# Patient Record
Sex: Male | Born: 1945 | Race: White | Hispanic: No | Marital: Married | Smoking: Former smoker
Health system: Southern US, Community
[De-identification: ages and names within clinical notes are randomized; demographics above are authoritative.]

## PROBLEM LIST (undated history)

## (undated) DIAGNOSIS — E119 Type 2 diabetes mellitus without complications: Secondary | ICD-10-CM

## (undated) DIAGNOSIS — M439 Deforming dorsopathy, unspecified: Secondary | ICD-10-CM

## (undated) DIAGNOSIS — I10 Essential (primary) hypertension: Secondary | ICD-10-CM

## (undated) DIAGNOSIS — H3530 Unspecified macular degeneration: Secondary | ICD-10-CM

## (undated) DIAGNOSIS — G4733 Obstructive sleep apnea (adult) (pediatric): Secondary | ICD-10-CM

## (undated) DIAGNOSIS — I2510 Atherosclerotic heart disease of native coronary artery without angina pectoris: Secondary | ICD-10-CM

## (undated) DIAGNOSIS — D649 Anemia, unspecified: Secondary | ICD-10-CM

## (undated) DIAGNOSIS — E669 Obesity, unspecified: Secondary | ICD-10-CM

## (undated) DIAGNOSIS — H269 Unspecified cataract: Secondary | ICD-10-CM

## (undated) DIAGNOSIS — K8020 Calculus of gallbladder without cholecystitis without obstruction: Secondary | ICD-10-CM

## (undated) DIAGNOSIS — M1990 Unspecified osteoarthritis, unspecified site: Secondary | ICD-10-CM

## (undated) DIAGNOSIS — C4491 Basal cell carcinoma of skin, unspecified: Secondary | ICD-10-CM

## (undated) DIAGNOSIS — N200 Calculus of kidney: Secondary | ICD-10-CM

## (undated) HISTORY — DX: Obesity, unspecified: E66.9

## (undated) HISTORY — DX: Essential (primary) hypertension: I10

## (undated) HISTORY — PX: LAMINECTOMY: SHX219

## (undated) HISTORY — DX: Anemia, unspecified: D64.9

## (undated) HISTORY — PX: ANGIOPLASTY: SHX39

## (undated) HISTORY — DX: Calculus of kidney: N20.0

## (undated) HISTORY — PX: REPLACEMENT TOTAL KNEE: SUR1224

## (undated) HISTORY — PX: HIP SURGERY: SHX245

## (undated) HISTORY — DX: Deforming dorsopathy, unspecified: M43.9

## (undated) HISTORY — DX: Unspecified macular degeneration: H35.30

## (undated) HISTORY — DX: Obstructive sleep apnea (adult) (pediatric): G47.33

## (undated) HISTORY — DX: Atherosclerotic heart disease of native coronary artery without angina pectoris: I25.10

## (undated) HISTORY — DX: Calculus of gallbladder without cholecystitis without obstruction: K80.20

## (undated) HISTORY — PX: KNEE ARTHROSCOPY: SUR90

## (undated) HISTORY — DX: Unspecified cataract: H26.9

## (undated) HISTORY — DX: Basal cell carcinoma of skin, unspecified: C44.91

## (undated) HISTORY — PX: COLONOSCOPY: SHX174

---
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
## 1992-02-12 HISTORY — PX: REPLACEMENT TOTAL KNEE: SUR1224

## 1999-02-12 HISTORY — PX: CHOLECYSTECTOMY: SHX55

## 2003-02-12 DIAGNOSIS — I2510 Atherosclerotic heart disease of native coronary artery without angina pectoris: Secondary | ICD-10-CM

## 2003-02-12 HISTORY — DX: Atherosclerotic heart disease of native coronary artery without angina pectoris: I25.10

## 2003-07-13 HISTORY — PX: CORONARY ANGIOPLASTY WITH STENT PLACEMENT: SHX49

## 2005-05-12 HISTORY — PX: KNEE ARTHROSCOPY: SUR90

## 2006-10-07 HISTORY — PX: ROUX-EN-Y GASTRIC BYPASS: SHX1104

## 2006-10-07 HISTORY — PX: GASTRIC BYPASS: SHX52

## 2007-01-12 HISTORY — PX: TOTAL HIP ARTHROPLASTY: SHX124

## 2007-02-12 HISTORY — PX: LAMINECTOMY: SHX219

## 2007-03-15 HISTORY — PX: CATARACT EXTRACTION, BILATERAL: SHX1313

## 2009-02-11 HISTORY — PX: ABDOMINOPLASTY: SUR9

## 2015-04-10 DIAGNOSIS — E119 Type 2 diabetes mellitus without complications: Secondary | ICD-10-CM | POA: Diagnosis not present

## 2015-04-10 DIAGNOSIS — Z6836 Body mass index (BMI) 36.0-36.9, adult: Secondary | ICD-10-CM | POA: Diagnosis not present

## 2015-04-10 DIAGNOSIS — I10 Essential (primary) hypertension: Secondary | ICD-10-CM | POA: Diagnosis not present

## 2015-06-15 DIAGNOSIS — Z955 Presence of coronary angioplasty implant and graft: Secondary | ICD-10-CM | POA: Diagnosis not present

## 2015-06-15 DIAGNOSIS — I10 Essential (primary) hypertension: Secondary | ICD-10-CM | POA: Diagnosis not present

## 2015-06-15 DIAGNOSIS — I25110 Atherosclerotic heart disease of native coronary artery with unstable angina pectoris: Secondary | ICD-10-CM | POA: Diagnosis not present

## 2015-06-19 DIAGNOSIS — I25110 Atherosclerotic heart disease of native coronary artery with unstable angina pectoris: Secondary | ICD-10-CM | POA: Diagnosis not present

## 2015-06-19 DIAGNOSIS — Z791 Long term (current) use of non-steroidal anti-inflammatories (NSAID): Secondary | ICD-10-CM | POA: Diagnosis not present

## 2015-06-19 DIAGNOSIS — Z79899 Other long term (current) drug therapy: Secondary | ICD-10-CM | POA: Diagnosis not present

## 2015-06-19 DIAGNOSIS — E669 Obesity, unspecified: Secondary | ICD-10-CM | POA: Diagnosis not present

## 2015-06-19 DIAGNOSIS — I10 Essential (primary) hypertension: Secondary | ICD-10-CM | POA: Diagnosis not present

## 2015-06-19 DIAGNOSIS — M1711 Unilateral primary osteoarthritis, right knee: Secondary | ICD-10-CM | POA: Diagnosis not present

## 2015-06-19 DIAGNOSIS — Z800 Family history of malignant neoplasm of digestive organs: Secondary | ICD-10-CM | POA: Diagnosis not present

## 2015-06-19 DIAGNOSIS — Z9884 Bariatric surgery status: Secondary | ICD-10-CM | POA: Diagnosis not present

## 2015-06-19 DIAGNOSIS — E119 Type 2 diabetes mellitus without complications: Secondary | ICD-10-CM | POA: Diagnosis not present

## 2015-06-19 DIAGNOSIS — Z87891 Personal history of nicotine dependence: Secondary | ICD-10-CM | POA: Diagnosis not present

## 2015-06-19 DIAGNOSIS — Z6836 Body mass index (BMI) 36.0-36.9, adult: Secondary | ICD-10-CM | POA: Diagnosis not present

## 2015-06-19 DIAGNOSIS — Z7982 Long term (current) use of aspirin: Secondary | ICD-10-CM | POA: Diagnosis not present

## 2015-06-19 DIAGNOSIS — G4733 Obstructive sleep apnea (adult) (pediatric): Secondary | ICD-10-CM | POA: Diagnosis not present

## 2015-06-19 DIAGNOSIS — E785 Hyperlipidemia, unspecified: Secondary | ICD-10-CM | POA: Diagnosis not present

## 2015-06-20 DIAGNOSIS — I25110 Atherosclerotic heart disease of native coronary artery with unstable angina pectoris: Secondary | ICD-10-CM | POA: Diagnosis not present

## 2015-06-20 DIAGNOSIS — G4733 Obstructive sleep apnea (adult) (pediatric): Secondary | ICD-10-CM | POA: Diagnosis not present

## 2015-06-20 DIAGNOSIS — E785 Hyperlipidemia, unspecified: Secondary | ICD-10-CM | POA: Diagnosis not present

## 2015-06-20 DIAGNOSIS — M1711 Unilateral primary osteoarthritis, right knee: Secondary | ICD-10-CM | POA: Diagnosis not present

## 2015-06-20 DIAGNOSIS — E119 Type 2 diabetes mellitus without complications: Secondary | ICD-10-CM | POA: Diagnosis not present

## 2015-06-20 DIAGNOSIS — I10 Essential (primary) hypertension: Secondary | ICD-10-CM | POA: Diagnosis not present

## 2015-06-20 DIAGNOSIS — Z955 Presence of coronary angioplasty implant and graft: Secondary | ICD-10-CM | POA: Diagnosis not present

## 2015-06-22 DIAGNOSIS — E782 Mixed hyperlipidemia: Secondary | ICD-10-CM | POA: Diagnosis not present

## 2015-06-22 DIAGNOSIS — I2510 Atherosclerotic heart disease of native coronary artery without angina pectoris: Secondary | ICD-10-CM | POA: Diagnosis not present

## 2015-06-22 DIAGNOSIS — Z125 Encounter for screening for malignant neoplasm of prostate: Secondary | ICD-10-CM | POA: Diagnosis not present

## 2015-06-22 DIAGNOSIS — Z6836 Body mass index (BMI) 36.0-36.9, adult: Secondary | ICD-10-CM | POA: Diagnosis not present

## 2015-06-22 DIAGNOSIS — E119 Type 2 diabetes mellitus without complications: Secondary | ICD-10-CM | POA: Diagnosis not present

## 2015-06-22 DIAGNOSIS — I200 Unstable angina: Secondary | ICD-10-CM | POA: Diagnosis not present

## 2015-06-22 DIAGNOSIS — I10 Essential (primary) hypertension: Secondary | ICD-10-CM | POA: Diagnosis not present

## 2015-07-07 DIAGNOSIS — E784 Other hyperlipidemia: Secondary | ICD-10-CM | POA: Diagnosis not present

## 2015-07-07 DIAGNOSIS — I2510 Atherosclerotic heart disease of native coronary artery without angina pectoris: Secondary | ICD-10-CM | POA: Diagnosis not present

## 2015-07-07 DIAGNOSIS — I724 Aneurysm of artery of lower extremity: Secondary | ICD-10-CM | POA: Diagnosis not present

## 2015-07-07 DIAGNOSIS — Z955 Presence of coronary angioplasty implant and graft: Secondary | ICD-10-CM | POA: Diagnosis not present

## 2015-07-07 DIAGNOSIS — S301XXA Contusion of abdominal wall, initial encounter: Secondary | ICD-10-CM | POA: Diagnosis not present

## 2015-07-07 DIAGNOSIS — R2241 Localized swelling, mass and lump, right lower limb: Secondary | ICD-10-CM | POA: Diagnosis not present

## 2015-07-07 DIAGNOSIS — T82898A Other specified complication of vascular prosthetic devices, implants and grafts, initial encounter: Secondary | ICD-10-CM | POA: Diagnosis not present

## 2015-07-07 DIAGNOSIS — I10 Essential (primary) hypertension: Secondary | ICD-10-CM | POA: Diagnosis not present

## 2015-07-12 DIAGNOSIS — S41111A Laceration without foreign body of right upper arm, initial encounter: Secondary | ICD-10-CM | POA: Diagnosis not present

## 2015-07-14 DIAGNOSIS — S41111A Laceration without foreign body of right upper arm, initial encounter: Secondary | ICD-10-CM | POA: Diagnosis present

## 2015-07-14 DIAGNOSIS — Z955 Presence of coronary angioplasty implant and graft: Secondary | ICD-10-CM | POA: Diagnosis not present

## 2015-07-14 DIAGNOSIS — I255 Ischemic cardiomyopathy: Secondary | ICD-10-CM | POA: Diagnosis present

## 2015-07-14 DIAGNOSIS — K209 Esophagitis, unspecified: Secondary | ICD-10-CM | POA: Diagnosis present

## 2015-07-14 DIAGNOSIS — I2510 Atherosclerotic heart disease of native coronary artery without angina pectoris: Secondary | ICD-10-CM | POA: Diagnosis not present

## 2015-07-14 DIAGNOSIS — E1122 Type 2 diabetes mellitus with diabetic chronic kidney disease: Secondary | ICD-10-CM | POA: Diagnosis present

## 2015-07-14 DIAGNOSIS — S301XXA Contusion of abdominal wall, initial encounter: Secondary | ICD-10-CM | POA: Diagnosis present

## 2015-07-14 DIAGNOSIS — Z96653 Presence of artificial knee joint, bilateral: Secondary | ICD-10-CM | POA: Diagnosis present

## 2015-07-14 DIAGNOSIS — I10 Essential (primary) hypertension: Secondary | ICD-10-CM | POA: Diagnosis not present

## 2015-07-14 DIAGNOSIS — Z9861 Coronary angioplasty status: Secondary | ICD-10-CM | POA: Diagnosis not present

## 2015-07-14 DIAGNOSIS — E785 Hyperlipidemia, unspecified: Secondary | ICD-10-CM | POA: Diagnosis present

## 2015-07-14 DIAGNOSIS — Z87891 Personal history of nicotine dependence: Secondary | ICD-10-CM | POA: Diagnosis not present

## 2015-07-14 DIAGNOSIS — Z9884 Bariatric surgery status: Secondary | ICD-10-CM | POA: Diagnosis not present

## 2015-07-14 DIAGNOSIS — K210 Gastro-esophageal reflux disease with esophagitis: Secondary | ICD-10-CM | POA: Diagnosis not present

## 2015-07-14 DIAGNOSIS — I129 Hypertensive chronic kidney disease with stage 1 through stage 4 chronic kidney disease, or unspecified chronic kidney disease: Secondary | ICD-10-CM | POA: Diagnosis present

## 2015-07-14 DIAGNOSIS — D500 Iron deficiency anemia secondary to blood loss (chronic): Secondary | ICD-10-CM | POA: Diagnosis not present

## 2015-07-14 DIAGNOSIS — N189 Chronic kidney disease, unspecified: Secondary | ICD-10-CM | POA: Diagnosis present

## 2015-07-14 DIAGNOSIS — Z7982 Long term (current) use of aspirin: Secondary | ICD-10-CM | POA: Diagnosis not present

## 2015-07-14 DIAGNOSIS — E119 Type 2 diabetes mellitus without complications: Secondary | ICD-10-CM | POA: Diagnosis not present

## 2015-07-14 DIAGNOSIS — K922 Gastrointestinal hemorrhage, unspecified: Secondary | ICD-10-CM | POA: Diagnosis not present

## 2015-07-14 DIAGNOSIS — K219 Gastro-esophageal reflux disease without esophagitis: Secondary | ICD-10-CM | POA: Diagnosis present

## 2015-07-14 DIAGNOSIS — Z6837 Body mass index (BMI) 37.0-37.9, adult: Secondary | ICD-10-CM | POA: Diagnosis not present

## 2015-07-14 DIAGNOSIS — K921 Melena: Secondary | ICD-10-CM | POA: Diagnosis not present

## 2015-07-14 DIAGNOSIS — D62 Acute posthemorrhagic anemia: Secondary | ICD-10-CM | POA: Diagnosis not present

## 2015-07-18 DIAGNOSIS — H353131 Nonexudative age-related macular degeneration, bilateral, early dry stage: Secondary | ICD-10-CM | POA: Diagnosis not present

## 2015-07-18 DIAGNOSIS — H2513 Age-related nuclear cataract, bilateral: Secondary | ICD-10-CM | POA: Diagnosis not present

## 2015-07-18 DIAGNOSIS — H524 Presbyopia: Secondary | ICD-10-CM | POA: Diagnosis not present

## 2015-07-24 DIAGNOSIS — K921 Melena: Secondary | ICD-10-CM | POA: Diagnosis not present

## 2015-07-24 DIAGNOSIS — D500 Iron deficiency anemia secondary to blood loss (chronic): Secondary | ICD-10-CM | POA: Diagnosis not present

## 2015-07-24 DIAGNOSIS — I2510 Atherosclerotic heart disease of native coronary artery without angina pectoris: Secondary | ICD-10-CM | POA: Diagnosis not present

## 2015-07-24 DIAGNOSIS — Z6836 Body mass index (BMI) 36.0-36.9, adult: Secondary | ICD-10-CM | POA: Diagnosis not present

## 2015-07-24 DIAGNOSIS — T148 Other injury of unspecified body region: Secondary | ICD-10-CM | POA: Diagnosis not present

## 2015-08-14 DIAGNOSIS — D500 Iron deficiency anemia secondary to blood loss (chronic): Secondary | ICD-10-CM | POA: Diagnosis not present

## 2015-09-07 DIAGNOSIS — J069 Acute upper respiratory infection, unspecified: Secondary | ICD-10-CM | POA: Diagnosis not present

## 2015-09-07 DIAGNOSIS — D500 Iron deficiency anemia secondary to blood loss (chronic): Secondary | ICD-10-CM | POA: Diagnosis not present

## 2015-09-07 DIAGNOSIS — T148 Other injury of unspecified body region: Secondary | ICD-10-CM | POA: Diagnosis not present

## 2015-09-07 DIAGNOSIS — I2510 Atherosclerotic heart disease of native coronary artery without angina pectoris: Secondary | ICD-10-CM | POA: Diagnosis not present

## 2015-09-07 DIAGNOSIS — I10 Essential (primary) hypertension: Secondary | ICD-10-CM | POA: Diagnosis not present

## 2015-09-07 DIAGNOSIS — K921 Melena: Secondary | ICD-10-CM | POA: Diagnosis not present

## 2015-09-07 DIAGNOSIS — E782 Mixed hyperlipidemia: Secondary | ICD-10-CM | POA: Diagnosis not present

## 2015-09-07 DIAGNOSIS — R739 Hyperglycemia, unspecified: Secondary | ICD-10-CM | POA: Diagnosis not present

## 2015-09-19 DIAGNOSIS — I10 Essential (primary) hypertension: Secondary | ICD-10-CM | POA: Diagnosis not present

## 2015-10-06 DIAGNOSIS — I2510 Atherosclerotic heart disease of native coronary artery without angina pectoris: Secondary | ICD-10-CM | POA: Diagnosis not present

## 2015-10-06 DIAGNOSIS — Z955 Presence of coronary angioplasty implant and graft: Secondary | ICD-10-CM | POA: Diagnosis not present

## 2015-10-06 DIAGNOSIS — E782 Mixed hyperlipidemia: Secondary | ICD-10-CM | POA: Diagnosis not present

## 2015-10-06 DIAGNOSIS — I10 Essential (primary) hypertension: Secondary | ICD-10-CM | POA: Diagnosis not present

## 2015-10-17 DIAGNOSIS — E119 Type 2 diabetes mellitus without complications: Secondary | ICD-10-CM | POA: Diagnosis not present

## 2015-10-17 DIAGNOSIS — E782 Mixed hyperlipidemia: Secondary | ICD-10-CM | POA: Diagnosis not present

## 2015-10-17 DIAGNOSIS — I10 Essential (primary) hypertension: Secondary | ICD-10-CM | POA: Diagnosis not present

## 2015-10-17 DIAGNOSIS — Z125 Encounter for screening for malignant neoplasm of prostate: Secondary | ICD-10-CM | POA: Diagnosis not present

## 2015-10-17 DIAGNOSIS — E785 Hyperlipidemia, unspecified: Secondary | ICD-10-CM | POA: Diagnosis not present

## 2015-10-19 DIAGNOSIS — Z6835 Body mass index (BMI) 35.0-35.9, adult: Secondary | ICD-10-CM | POA: Diagnosis not present

## 2015-10-19 DIAGNOSIS — E669 Obesity, unspecified: Secondary | ICD-10-CM | POA: Diagnosis not present

## 2015-10-19 DIAGNOSIS — D500 Iron deficiency anemia secondary to blood loss (chronic): Secondary | ICD-10-CM | POA: Diagnosis not present

## 2015-10-19 DIAGNOSIS — I2510 Atherosclerotic heart disease of native coronary artery without angina pectoris: Secondary | ICD-10-CM | POA: Diagnosis not present

## 2015-10-19 DIAGNOSIS — E782 Mixed hyperlipidemia: Secondary | ICD-10-CM | POA: Diagnosis not present

## 2015-10-19 DIAGNOSIS — I10 Essential (primary) hypertension: Secondary | ICD-10-CM | POA: Diagnosis not present

## 2015-11-15 DIAGNOSIS — Z23 Encounter for immunization: Secondary | ICD-10-CM | POA: Diagnosis not present

## 2015-11-20 DIAGNOSIS — T84032A Mechanical loosening of internal right knee prosthetic joint, initial encounter: Secondary | ICD-10-CM | POA: Diagnosis not present

## 2015-11-20 DIAGNOSIS — M25561 Pain in right knee: Secondary | ICD-10-CM | POA: Diagnosis not present

## 2015-12-20 DIAGNOSIS — Z96651 Presence of right artificial knee joint: Secondary | ICD-10-CM | POA: Diagnosis not present

## 2015-12-21 DIAGNOSIS — Z96651 Presence of right artificial knee joint: Secondary | ICD-10-CM | POA: Diagnosis not present

## 2015-12-25 DIAGNOSIS — T84038A Mechanical loosening of other internal prosthetic joint, initial encounter: Secondary | ICD-10-CM | POA: Diagnosis not present

## 2015-12-25 DIAGNOSIS — Z96651 Presence of right artificial knee joint: Secondary | ICD-10-CM | POA: Diagnosis not present

## 2016-01-03 DIAGNOSIS — Z96659 Presence of unspecified artificial knee joint: Secondary | ICD-10-CM | POA: Diagnosis not present

## 2016-01-03 DIAGNOSIS — Z96653 Presence of artificial knee joint, bilateral: Secondary | ICD-10-CM | POA: Diagnosis not present

## 2016-01-03 DIAGNOSIS — T84038A Mechanical loosening of other internal prosthetic joint, initial encounter: Secondary | ICD-10-CM | POA: Diagnosis not present

## 2016-01-03 DIAGNOSIS — M25561 Pain in right knee: Secondary | ICD-10-CM | POA: Diagnosis not present

## 2016-01-03 DIAGNOSIS — Z96651 Presence of right artificial knee joint: Secondary | ICD-10-CM | POA: Diagnosis not present

## 2016-01-03 DIAGNOSIS — Z96652 Presence of left artificial knee joint: Secondary | ICD-10-CM | POA: Diagnosis not present

## 2016-01-23 DIAGNOSIS — T84032A Mechanical loosening of internal right knee prosthetic joint, initial encounter: Secondary | ICD-10-CM | POA: Diagnosis not present

## 2016-01-25 DIAGNOSIS — Z96651 Presence of right artificial knee joint: Secondary | ICD-10-CM | POA: Diagnosis not present

## 2016-01-25 DIAGNOSIS — T84028D Dislocation of other internal joint prosthesis, subsequent encounter: Secondary | ICD-10-CM | POA: Diagnosis not present

## 2016-01-25 DIAGNOSIS — Z96659 Presence of unspecified artificial knee joint: Secondary | ICD-10-CM | POA: Diagnosis not present

## 2016-02-22 DIAGNOSIS — Z955 Presence of coronary angioplasty implant and graft: Secondary | ICD-10-CM | POA: Diagnosis not present

## 2016-02-22 DIAGNOSIS — I2510 Atherosclerotic heart disease of native coronary artery without angina pectoris: Secondary | ICD-10-CM | POA: Diagnosis not present

## 2016-02-22 DIAGNOSIS — I10 Essential (primary) hypertension: Secondary | ICD-10-CM | POA: Diagnosis not present

## 2016-02-22 DIAGNOSIS — Z01810 Encounter for preprocedural cardiovascular examination: Secondary | ICD-10-CM | POA: Diagnosis not present

## 2016-02-22 DIAGNOSIS — M25561 Pain in right knee: Secondary | ICD-10-CM | POA: Diagnosis not present

## 2016-02-22 DIAGNOSIS — E785 Hyperlipidemia, unspecified: Secondary | ICD-10-CM | POA: Diagnosis not present

## 2016-04-16 DIAGNOSIS — Z6833 Body mass index (BMI) 33.0-33.9, adult: Secondary | ICD-10-CM | POA: Diagnosis not present

## 2016-04-16 DIAGNOSIS — Z0000 Encounter for general adult medical examination without abnormal findings: Secondary | ICD-10-CM | POA: Diagnosis not present

## 2016-05-20 DIAGNOSIS — E782 Mixed hyperlipidemia: Secondary | ICD-10-CM | POA: Insufficient documentation

## 2016-05-21 DIAGNOSIS — R001 Bradycardia, unspecified: Secondary | ICD-10-CM | POA: Diagnosis not present

## 2016-05-21 DIAGNOSIS — Z01818 Encounter for other preprocedural examination: Secondary | ICD-10-CM | POA: Diagnosis not present

## 2016-05-21 DIAGNOSIS — I10 Essential (primary) hypertension: Secondary | ICD-10-CM | POA: Diagnosis not present

## 2016-05-21 DIAGNOSIS — Z955 Presence of coronary angioplasty implant and graft: Secondary | ICD-10-CM | POA: Diagnosis not present

## 2016-05-21 DIAGNOSIS — I2510 Atherosclerotic heart disease of native coronary artery without angina pectoris: Secondary | ICD-10-CM | POA: Diagnosis not present

## 2016-05-21 DIAGNOSIS — E785 Hyperlipidemia, unspecified: Secondary | ICD-10-CM | POA: Diagnosis not present

## 2016-06-05 DIAGNOSIS — L821 Other seborrheic keratosis: Secondary | ICD-10-CM | POA: Diagnosis not present

## 2016-06-05 DIAGNOSIS — D492 Neoplasm of unspecified behavior of bone, soft tissue, and skin: Secondary | ICD-10-CM | POA: Diagnosis not present

## 2016-06-05 DIAGNOSIS — C44619 Basal cell carcinoma of skin of left upper limb, including shoulder: Secondary | ICD-10-CM | POA: Diagnosis not present

## 2016-06-10 DIAGNOSIS — E782 Mixed hyperlipidemia: Secondary | ICD-10-CM | POA: Diagnosis not present

## 2016-06-10 DIAGNOSIS — T84038A Mechanical loosening of other internal prosthetic joint, initial encounter: Secondary | ICD-10-CM | POA: Diagnosis not present

## 2016-06-10 DIAGNOSIS — Z01818 Encounter for other preprocedural examination: Secondary | ICD-10-CM | POA: Diagnosis not present

## 2016-06-10 DIAGNOSIS — I10 Essential (primary) hypertension: Secondary | ICD-10-CM | POA: Diagnosis not present

## 2016-06-10 DIAGNOSIS — R799 Abnormal finding of blood chemistry, unspecified: Secondary | ICD-10-CM | POA: Diagnosis not present

## 2016-06-10 DIAGNOSIS — Z96659 Presence of unspecified artificial knee joint: Secondary | ICD-10-CM | POA: Diagnosis not present

## 2016-06-10 DIAGNOSIS — I2510 Atherosclerotic heart disease of native coronary artery without angina pectoris: Secondary | ICD-10-CM | POA: Diagnosis not present

## 2016-06-21 DIAGNOSIS — I2510 Atherosclerotic heart disease of native coronary artery without angina pectoris: Secondary | ICD-10-CM | POA: Diagnosis present

## 2016-06-21 DIAGNOSIS — Z9884 Bariatric surgery status: Secondary | ICD-10-CM | POA: Diagnosis not present

## 2016-06-21 DIAGNOSIS — Z96651 Presence of right artificial knee joint: Secondary | ICD-10-CM | POA: Diagnosis not present

## 2016-06-21 DIAGNOSIS — Z96641 Presence of right artificial hip joint: Secondary | ICD-10-CM | POA: Diagnosis present

## 2016-06-21 DIAGNOSIS — T84022A Instability of internal right knee prosthesis, initial encounter: Secondary | ICD-10-CM | POA: Diagnosis not present

## 2016-06-21 DIAGNOSIS — G8918 Other acute postprocedural pain: Secondary | ICD-10-CM | POA: Diagnosis not present

## 2016-06-21 DIAGNOSIS — Z7982 Long term (current) use of aspirin: Secondary | ICD-10-CM | POA: Diagnosis not present

## 2016-06-21 DIAGNOSIS — M1711 Unilateral primary osteoarthritis, right knee: Secondary | ICD-10-CM | POA: Diagnosis not present

## 2016-06-21 DIAGNOSIS — T84038A Mechanical loosening of other internal prosthetic joint, initial encounter: Secondary | ICD-10-CM | POA: Diagnosis not present

## 2016-06-21 DIAGNOSIS — E782 Mixed hyperlipidemia: Secondary | ICD-10-CM | POA: Diagnosis present

## 2016-06-21 DIAGNOSIS — Z96652 Presence of left artificial knee joint: Secondary | ICD-10-CM | POA: Diagnosis present

## 2016-06-21 DIAGNOSIS — Z471 Aftercare following joint replacement surgery: Secondary | ICD-10-CM | POA: Diagnosis not present

## 2016-06-21 DIAGNOSIS — Z955 Presence of coronary angioplasty implant and graft: Secondary | ICD-10-CM | POA: Diagnosis not present

## 2016-06-21 DIAGNOSIS — I10 Essential (primary) hypertension: Secondary | ICD-10-CM | POA: Diagnosis present

## 2016-06-21 DIAGNOSIS — M25561 Pain in right knee: Secondary | ICD-10-CM | POA: Diagnosis not present

## 2016-06-21 DIAGNOSIS — Z7902 Long term (current) use of antithrombotics/antiplatelets: Secondary | ICD-10-CM | POA: Diagnosis not present

## 2016-06-21 DIAGNOSIS — N179 Acute kidney failure, unspecified: Secondary | ICD-10-CM | POA: Diagnosis not present

## 2016-06-21 DIAGNOSIS — T84032A Mechanical loosening of internal right knee prosthetic joint, initial encounter: Secondary | ICD-10-CM | POA: Diagnosis present

## 2016-06-21 DIAGNOSIS — G4733 Obstructive sleep apnea (adult) (pediatric): Secondary | ICD-10-CM | POA: Diagnosis present

## 2016-06-26 DIAGNOSIS — I2510 Atherosclerotic heart disease of native coronary artery without angina pectoris: Secondary | ICD-10-CM | POA: Diagnosis not present

## 2016-06-26 DIAGNOSIS — T84032D Mechanical loosening of internal right knee prosthetic joint, subsequent encounter: Secondary | ICD-10-CM | POA: Diagnosis not present

## 2016-06-26 DIAGNOSIS — I10 Essential (primary) hypertension: Secondary | ICD-10-CM | POA: Diagnosis not present

## 2016-06-27 DIAGNOSIS — I2510 Atherosclerotic heart disease of native coronary artery without angina pectoris: Secondary | ICD-10-CM | POA: Diagnosis not present

## 2016-06-27 DIAGNOSIS — T84032D Mechanical loosening of internal right knee prosthetic joint, subsequent encounter: Secondary | ICD-10-CM | POA: Diagnosis not present

## 2016-06-27 DIAGNOSIS — I10 Essential (primary) hypertension: Secondary | ICD-10-CM | POA: Diagnosis not present

## 2016-06-28 DIAGNOSIS — T84032D Mechanical loosening of internal right knee prosthetic joint, subsequent encounter: Secondary | ICD-10-CM | POA: Diagnosis not present

## 2016-06-28 DIAGNOSIS — I10 Essential (primary) hypertension: Secondary | ICD-10-CM | POA: Diagnosis not present

## 2016-06-28 DIAGNOSIS — I2510 Atherosclerotic heart disease of native coronary artery without angina pectoris: Secondary | ICD-10-CM | POA: Diagnosis not present

## 2016-07-01 DIAGNOSIS — T84032D Mechanical loosening of internal right knee prosthetic joint, subsequent encounter: Secondary | ICD-10-CM | POA: Diagnosis not present

## 2016-07-01 DIAGNOSIS — I2510 Atherosclerotic heart disease of native coronary artery without angina pectoris: Secondary | ICD-10-CM | POA: Diagnosis not present

## 2016-07-01 DIAGNOSIS — I10 Essential (primary) hypertension: Secondary | ICD-10-CM | POA: Diagnosis not present

## 2016-07-02 DIAGNOSIS — T84032D Mechanical loosening of internal right knee prosthetic joint, subsequent encounter: Secondary | ICD-10-CM | POA: Diagnosis not present

## 2016-07-02 DIAGNOSIS — I10 Essential (primary) hypertension: Secondary | ICD-10-CM | POA: Diagnosis not present

## 2016-07-02 DIAGNOSIS — I2510 Atherosclerotic heart disease of native coronary artery without angina pectoris: Secondary | ICD-10-CM | POA: Diagnosis not present

## 2016-07-03 DIAGNOSIS — I2510 Atherosclerotic heart disease of native coronary artery without angina pectoris: Secondary | ICD-10-CM | POA: Diagnosis not present

## 2016-07-03 DIAGNOSIS — T84032D Mechanical loosening of internal right knee prosthetic joint, subsequent encounter: Secondary | ICD-10-CM | POA: Diagnosis not present

## 2016-07-03 DIAGNOSIS — I10 Essential (primary) hypertension: Secondary | ICD-10-CM | POA: Diagnosis not present

## 2016-07-04 DIAGNOSIS — I2510 Atherosclerotic heart disease of native coronary artery without angina pectoris: Secondary | ICD-10-CM | POA: Diagnosis not present

## 2016-07-04 DIAGNOSIS — T84032D Mechanical loosening of internal right knee prosthetic joint, subsequent encounter: Secondary | ICD-10-CM | POA: Diagnosis not present

## 2016-07-04 DIAGNOSIS — I10 Essential (primary) hypertension: Secondary | ICD-10-CM | POA: Diagnosis not present

## 2016-07-05 DIAGNOSIS — T84032D Mechanical loosening of internal right knee prosthetic joint, subsequent encounter: Secondary | ICD-10-CM | POA: Diagnosis not present

## 2016-07-05 DIAGNOSIS — I2510 Atherosclerotic heart disease of native coronary artery without angina pectoris: Secondary | ICD-10-CM | POA: Diagnosis not present

## 2016-07-05 DIAGNOSIS — I10 Essential (primary) hypertension: Secondary | ICD-10-CM | POA: Diagnosis not present

## 2016-07-09 DIAGNOSIS — I10 Essential (primary) hypertension: Secondary | ICD-10-CM | POA: Diagnosis not present

## 2016-07-09 DIAGNOSIS — I2510 Atherosclerotic heart disease of native coronary artery without angina pectoris: Secondary | ICD-10-CM | POA: Diagnosis not present

## 2016-07-09 DIAGNOSIS — T84032D Mechanical loosening of internal right knee prosthetic joint, subsequent encounter: Secondary | ICD-10-CM | POA: Diagnosis not present

## 2016-07-10 DIAGNOSIS — T84032D Mechanical loosening of internal right knee prosthetic joint, subsequent encounter: Secondary | ICD-10-CM | POA: Diagnosis not present

## 2016-07-10 DIAGNOSIS — I10 Essential (primary) hypertension: Secondary | ICD-10-CM | POA: Diagnosis not present

## 2016-07-10 DIAGNOSIS — I2510 Atherosclerotic heart disease of native coronary artery without angina pectoris: Secondary | ICD-10-CM | POA: Diagnosis not present

## 2016-07-11 DIAGNOSIS — T84032D Mechanical loosening of internal right knee prosthetic joint, subsequent encounter: Secondary | ICD-10-CM | POA: Diagnosis not present

## 2016-07-11 DIAGNOSIS — I2510 Atherosclerotic heart disease of native coronary artery without angina pectoris: Secondary | ICD-10-CM | POA: Diagnosis not present

## 2016-07-11 DIAGNOSIS — I10 Essential (primary) hypertension: Secondary | ICD-10-CM | POA: Diagnosis not present

## 2016-07-17 DIAGNOSIS — M62551 Muscle wasting and atrophy, not elsewhere classified, right thigh: Secondary | ICD-10-CM | POA: Diagnosis not present

## 2016-07-17 DIAGNOSIS — R262 Difficulty in walking, not elsewhere classified: Secondary | ICD-10-CM | POA: Diagnosis not present

## 2016-07-17 DIAGNOSIS — G8929 Other chronic pain: Secondary | ICD-10-CM | POA: Diagnosis not present

## 2016-07-17 DIAGNOSIS — M25661 Stiffness of right knee, not elsewhere classified: Secondary | ICD-10-CM | POA: Diagnosis not present

## 2016-07-17 DIAGNOSIS — M25561 Pain in right knee: Secondary | ICD-10-CM | POA: Diagnosis not present

## 2016-07-17 DIAGNOSIS — M25461 Effusion, right knee: Secondary | ICD-10-CM | POA: Diagnosis not present

## 2016-07-17 DIAGNOSIS — Z96651 Presence of right artificial knee joint: Secondary | ICD-10-CM | POA: Diagnosis not present

## 2016-07-22 DIAGNOSIS — H25013 Cortical age-related cataract, bilateral: Secondary | ICD-10-CM | POA: Diagnosis not present

## 2016-07-22 DIAGNOSIS — E113293 Type 2 diabetes mellitus with mild nonproliferative diabetic retinopathy without macular edema, bilateral: Secondary | ICD-10-CM | POA: Diagnosis not present

## 2016-07-22 DIAGNOSIS — H353132 Nonexudative age-related macular degeneration, bilateral, intermediate dry stage: Secondary | ICD-10-CM | POA: Diagnosis not present

## 2016-07-22 DIAGNOSIS — H524 Presbyopia: Secondary | ICD-10-CM | POA: Diagnosis not present

## 2016-07-23 DIAGNOSIS — M25461 Effusion, right knee: Secondary | ICD-10-CM | POA: Diagnosis not present

## 2016-07-23 DIAGNOSIS — Z96651 Presence of right artificial knee joint: Secondary | ICD-10-CM | POA: Diagnosis not present

## 2016-07-23 DIAGNOSIS — R262 Difficulty in walking, not elsewhere classified: Secondary | ICD-10-CM | POA: Diagnosis not present

## 2016-07-23 DIAGNOSIS — M62551 Muscle wasting and atrophy, not elsewhere classified, right thigh: Secondary | ICD-10-CM | POA: Diagnosis not present

## 2016-07-23 DIAGNOSIS — M25561 Pain in right knee: Secondary | ICD-10-CM | POA: Diagnosis not present

## 2016-07-23 DIAGNOSIS — M25661 Stiffness of right knee, not elsewhere classified: Secondary | ICD-10-CM | POA: Diagnosis not present

## 2016-07-23 DIAGNOSIS — G8929 Other chronic pain: Secondary | ICD-10-CM | POA: Diagnosis not present

## 2016-07-31 DIAGNOSIS — G8929 Other chronic pain: Secondary | ICD-10-CM | POA: Diagnosis not present

## 2016-07-31 DIAGNOSIS — M25661 Stiffness of right knee, not elsewhere classified: Secondary | ICD-10-CM | POA: Diagnosis not present

## 2016-07-31 DIAGNOSIS — Z96651 Presence of right artificial knee joint: Secondary | ICD-10-CM | POA: Diagnosis not present

## 2016-07-31 DIAGNOSIS — M25461 Effusion, right knee: Secondary | ICD-10-CM | POA: Diagnosis not present

## 2016-07-31 DIAGNOSIS — M62551 Muscle wasting and atrophy, not elsewhere classified, right thigh: Secondary | ICD-10-CM | POA: Diagnosis not present

## 2016-07-31 DIAGNOSIS — R262 Difficulty in walking, not elsewhere classified: Secondary | ICD-10-CM | POA: Diagnosis not present

## 2016-07-31 DIAGNOSIS — M25561 Pain in right knee: Secondary | ICD-10-CM | POA: Diagnosis not present

## 2016-08-02 DIAGNOSIS — M62551 Muscle wasting and atrophy, not elsewhere classified, right thigh: Secondary | ICD-10-CM | POA: Diagnosis not present

## 2016-08-02 DIAGNOSIS — R262 Difficulty in walking, not elsewhere classified: Secondary | ICD-10-CM | POA: Diagnosis not present

## 2016-08-02 DIAGNOSIS — Z96651 Presence of right artificial knee joint: Secondary | ICD-10-CM | POA: Diagnosis not present

## 2016-08-02 DIAGNOSIS — M25561 Pain in right knee: Secondary | ICD-10-CM | POA: Diagnosis not present

## 2016-08-02 DIAGNOSIS — G8929 Other chronic pain: Secondary | ICD-10-CM | POA: Diagnosis not present

## 2016-08-02 DIAGNOSIS — M25661 Stiffness of right knee, not elsewhere classified: Secondary | ICD-10-CM | POA: Diagnosis not present

## 2016-08-02 DIAGNOSIS — M25461 Effusion, right knee: Secondary | ICD-10-CM | POA: Diagnosis not present

## 2016-08-06 DIAGNOSIS — R262 Difficulty in walking, not elsewhere classified: Secondary | ICD-10-CM | POA: Diagnosis not present

## 2016-08-06 DIAGNOSIS — M25561 Pain in right knee: Secondary | ICD-10-CM | POA: Diagnosis not present

## 2016-08-06 DIAGNOSIS — M25461 Effusion, right knee: Secondary | ICD-10-CM | POA: Diagnosis not present

## 2016-08-06 DIAGNOSIS — M62551 Muscle wasting and atrophy, not elsewhere classified, right thigh: Secondary | ICD-10-CM | POA: Diagnosis not present

## 2016-08-06 DIAGNOSIS — Z96651 Presence of right artificial knee joint: Secondary | ICD-10-CM | POA: Diagnosis not present

## 2016-08-06 DIAGNOSIS — M25661 Stiffness of right knee, not elsewhere classified: Secondary | ICD-10-CM | POA: Diagnosis not present

## 2016-08-06 DIAGNOSIS — G8929 Other chronic pain: Secondary | ICD-10-CM | POA: Diagnosis not present

## 2016-08-08 DIAGNOSIS — Z96651 Presence of right artificial knee joint: Secondary | ICD-10-CM | POA: Diagnosis not present

## 2016-08-08 DIAGNOSIS — M25561 Pain in right knee: Secondary | ICD-10-CM | POA: Diagnosis not present

## 2016-08-13 DIAGNOSIS — R262 Difficulty in walking, not elsewhere classified: Secondary | ICD-10-CM | POA: Diagnosis not present

## 2016-08-13 DIAGNOSIS — M25561 Pain in right knee: Secondary | ICD-10-CM | POA: Diagnosis not present

## 2016-08-13 DIAGNOSIS — M62551 Muscle wasting and atrophy, not elsewhere classified, right thigh: Secondary | ICD-10-CM | POA: Diagnosis not present

## 2016-08-13 DIAGNOSIS — M25461 Effusion, right knee: Secondary | ICD-10-CM | POA: Diagnosis not present

## 2016-08-13 DIAGNOSIS — Z96651 Presence of right artificial knee joint: Secondary | ICD-10-CM | POA: Diagnosis not present

## 2016-08-13 DIAGNOSIS — M25661 Stiffness of right knee, not elsewhere classified: Secondary | ICD-10-CM | POA: Diagnosis not present

## 2016-08-13 DIAGNOSIS — G8929 Other chronic pain: Secondary | ICD-10-CM | POA: Diagnosis not present

## 2016-08-15 DIAGNOSIS — Z96651 Presence of right artificial knee joint: Secondary | ICD-10-CM | POA: Diagnosis not present

## 2016-08-15 DIAGNOSIS — M62551 Muscle wasting and atrophy, not elsewhere classified, right thigh: Secondary | ICD-10-CM | POA: Diagnosis not present

## 2016-08-15 DIAGNOSIS — M25661 Stiffness of right knee, not elsewhere classified: Secondary | ICD-10-CM | POA: Diagnosis not present

## 2016-08-15 DIAGNOSIS — G8929 Other chronic pain: Secondary | ICD-10-CM | POA: Diagnosis not present

## 2016-08-15 DIAGNOSIS — M25561 Pain in right knee: Secondary | ICD-10-CM | POA: Diagnosis not present

## 2016-08-15 DIAGNOSIS — R262 Difficulty in walking, not elsewhere classified: Secondary | ICD-10-CM | POA: Diagnosis not present

## 2016-08-15 DIAGNOSIS — M25461 Effusion, right knee: Secondary | ICD-10-CM | POA: Diagnosis not present

## 2016-08-19 DIAGNOSIS — M62551 Muscle wasting and atrophy, not elsewhere classified, right thigh: Secondary | ICD-10-CM | POA: Diagnosis not present

## 2016-08-19 DIAGNOSIS — R262 Difficulty in walking, not elsewhere classified: Secondary | ICD-10-CM | POA: Diagnosis not present

## 2016-08-19 DIAGNOSIS — M25461 Effusion, right knee: Secondary | ICD-10-CM | POA: Diagnosis not present

## 2016-08-19 DIAGNOSIS — G8929 Other chronic pain: Secondary | ICD-10-CM | POA: Diagnosis not present

## 2016-08-19 DIAGNOSIS — Z96651 Presence of right artificial knee joint: Secondary | ICD-10-CM | POA: Diagnosis not present

## 2016-08-19 DIAGNOSIS — M25661 Stiffness of right knee, not elsewhere classified: Secondary | ICD-10-CM | POA: Diagnosis not present

## 2016-08-19 DIAGNOSIS — M25561 Pain in right knee: Secondary | ICD-10-CM | POA: Diagnosis not present

## 2016-08-21 DIAGNOSIS — M25461 Effusion, right knee: Secondary | ICD-10-CM | POA: Diagnosis not present

## 2016-08-21 DIAGNOSIS — G8929 Other chronic pain: Secondary | ICD-10-CM | POA: Diagnosis not present

## 2016-08-21 DIAGNOSIS — M25561 Pain in right knee: Secondary | ICD-10-CM | POA: Diagnosis not present

## 2016-08-21 DIAGNOSIS — Z96651 Presence of right artificial knee joint: Secondary | ICD-10-CM | POA: Diagnosis not present

## 2016-08-21 DIAGNOSIS — R262 Difficulty in walking, not elsewhere classified: Secondary | ICD-10-CM | POA: Diagnosis not present

## 2016-08-21 DIAGNOSIS — M62551 Muscle wasting and atrophy, not elsewhere classified, right thigh: Secondary | ICD-10-CM | POA: Diagnosis not present

## 2016-08-21 DIAGNOSIS — M25661 Stiffness of right knee, not elsewhere classified: Secondary | ICD-10-CM | POA: Diagnosis not present

## 2016-10-16 DIAGNOSIS — Z0189 Encounter for other specified special examinations: Secondary | ICD-10-CM | POA: Diagnosis not present

## 2016-10-16 DIAGNOSIS — C44619 Basal cell carcinoma of skin of left upper limb, including shoulder: Secondary | ICD-10-CM | POA: Diagnosis not present

## 2016-10-17 DIAGNOSIS — A4902 Methicillin resistant Staphylococcus aureus infection, unspecified site: Secondary | ICD-10-CM | POA: Diagnosis not present

## 2016-10-29 DIAGNOSIS — Z23 Encounter for immunization: Secondary | ICD-10-CM | POA: Diagnosis not present

## 2016-11-13 DIAGNOSIS — B9689 Other specified bacterial agents as the cause of diseases classified elsewhere: Secondary | ICD-10-CM | POA: Diagnosis not present

## 2016-11-13 DIAGNOSIS — Z08 Encounter for follow-up examination after completed treatment for malignant neoplasm: Secondary | ICD-10-CM | POA: Diagnosis not present

## 2016-11-13 DIAGNOSIS — L02426 Furuncle of left lower limb: Secondary | ICD-10-CM | POA: Diagnosis not present

## 2016-11-13 DIAGNOSIS — Z85828 Personal history of other malignant neoplasm of skin: Secondary | ICD-10-CM | POA: Diagnosis not present

## 2016-12-19 DIAGNOSIS — Z6837 Body mass index (BMI) 37.0-37.9, adult: Secondary | ICD-10-CM | POA: Diagnosis not present

## 2016-12-19 DIAGNOSIS — D6489 Other specified anemias: Secondary | ICD-10-CM | POA: Diagnosis not present

## 2016-12-19 DIAGNOSIS — I2510 Atherosclerotic heart disease of native coronary artery without angina pectoris: Secondary | ICD-10-CM | POA: Diagnosis not present

## 2016-12-19 DIAGNOSIS — Z1389 Encounter for screening for other disorder: Secondary | ICD-10-CM | POA: Diagnosis not present

## 2016-12-19 DIAGNOSIS — H3530 Unspecified macular degeneration: Secondary | ICD-10-CM | POA: Diagnosis not present

## 2016-12-19 DIAGNOSIS — H268 Other specified cataract: Secondary | ICD-10-CM | POA: Diagnosis not present

## 2016-12-19 DIAGNOSIS — E668 Other obesity: Secondary | ICD-10-CM | POA: Diagnosis not present

## 2016-12-19 DIAGNOSIS — E119 Type 2 diabetes mellitus without complications: Secondary | ICD-10-CM | POA: Diagnosis not present

## 2016-12-19 DIAGNOSIS — J3089 Other allergic rhinitis: Secondary | ICD-10-CM | POA: Diagnosis not present

## 2016-12-19 DIAGNOSIS — I10 Essential (primary) hypertension: Secondary | ICD-10-CM | POA: Diagnosis not present

## 2016-12-19 DIAGNOSIS — M1990 Unspecified osteoarthritis, unspecified site: Secondary | ICD-10-CM | POA: Diagnosis not present

## 2016-12-19 DIAGNOSIS — E7849 Other hyperlipidemia: Secondary | ICD-10-CM | POA: Diagnosis not present

## 2016-12-27 DIAGNOSIS — D649 Anemia, unspecified: Secondary | ICD-10-CM | POA: Diagnosis not present

## 2016-12-27 DIAGNOSIS — N183 Chronic kidney disease, stage 3 (moderate): Secondary | ICD-10-CM | POA: Diagnosis not present

## 2016-12-27 DIAGNOSIS — Z79899 Other long term (current) drug therapy: Secondary | ICD-10-CM | POA: Diagnosis not present

## 2016-12-27 DIAGNOSIS — R82998 Other abnormal findings in urine: Secondary | ICD-10-CM | POA: Diagnosis not present

## 2016-12-27 DIAGNOSIS — Z0000 Encounter for general adult medical examination without abnormal findings: Secondary | ICD-10-CM | POA: Diagnosis not present

## 2016-12-27 DIAGNOSIS — Z125 Encounter for screening for malignant neoplasm of prostate: Secondary | ICD-10-CM | POA: Diagnosis not present

## 2016-12-27 DIAGNOSIS — I10 Essential (primary) hypertension: Secondary | ICD-10-CM | POA: Diagnosis not present

## 2016-12-27 DIAGNOSIS — E1129 Type 2 diabetes mellitus with other diabetic kidney complication: Secondary | ICD-10-CM | POA: Diagnosis not present

## 2017-01-09 ENCOUNTER — Other Ambulatory Visit (HOSPITAL_COMMUNITY): Payer: Self-pay | Admitting: *Deleted

## 2017-01-09 NOTE — Discharge Instructions (Signed)

## 2017-01-09 NOTE — Progress Notes (Signed)
Cardiology Office Note   Date:  01/10/2017   ID:  Carlos Moreno, DOB 1945/06/21, MRN [id number]  PCP:  Crist Infante, MD  Cardiologist:   Minus Breeding, MD  Referring:  Crist Infante, MD  Chief Complaint  Patient presents with  . Coronary Artery Disease      History of Present Illness: Carlos Moreno is a 71 y.o. male who is referred by Dr. Dr. Joylene Draft for evaluation of CAD.  He had distant stenting in 2005.  Have these records.  He also had more recent stenting in 2007.  He thinks this was to his LAD.  Again I do not have the specific records.  The first time he was being screened for gastric surgery and had an abnormal stress test.  The second time he had some vague chest discomfort and was found to have a lesion.  He said he is done well with his angioplasties.  He denies any cardiovascular symptoms.  He is somewhat limited by chronic joint pain.  He denies any chest pressure, neck or arm discomfort.  He said no palpitations, presyncope or syncope.  He denies any PND or orthopnea.  Of note he did have bleeding on Brilinta but tolerated Plavix.  He took the Plavix and aspirin together for 1 year.   Past Medical History:  Diagnosis Date  . Anemia   . Basal cell carcinoma   . CAD (coronary artery disease)    Stent 2017.  Stent 2005 (Details of both stents pending)  . Cataract   . Curvature of spine   . Hypertension   . Macular degeneration   . Obesity   . OSA (obstructive sleep apnea)    CPAP    Past Surgical History:  Procedure Laterality Date  . ABDOMINOPLASTY  02/2009  . ANGIOPLASTY  06/05 05/17   with Stents  . CHOLECYSTECTOMY  2001  . COLONOSCOPY  08/07 11/12 04/16   . GASTRIC BYPASS  10/07/2006  . HERNIA REPAIR Left 1956  . KNEE ARTHROSCOPY Right 04/07 12/14 05/18    x3  . LAMINECTOMY     Lumbar  . REPLACEMENT TOTAL KNEE Left 1994  . TOTAL HIP ARTHROPLASTY  01/2007     Current Outpatient Medications  Medication Sig Dispense Refill  . aspirin EC 81 MG  tablet Take 81 mg by mouth daily.    Marland Kitchen atorvastatin (LIPITOR) 40 MG tablet Take 40 mg by mouth daily.  3  . b complex vitamins capsule Take 1 capsule by mouth as directed.    Marland Kitchen BYSTOLIC 5 MG tablet Take 5 mg by mouth daily.  3  . calcium gluconate 500 MG tablet Take 1 tablet by mouth daily.    . cetirizine (ZYRTEC) 10 MG tablet Take 10 mg by mouth daily.    . Cholecalciferol (VITAMIN D3) 5000 units CAPS Take 1 capsule by mouth daily.    . Cyanocobalamin (B-12) 5000 MCG SUBL Place 1 tablet under the tongue as directed.    . Diphenhydramine-APAP, sleep, (TYLENOL PM EXTRA STRENGTH PO) Take 1 tablet by mouth daily.    . isosorbide mononitrate (IMDUR) 30 MG 24 hr tablet Take 30 mg by mouth daily.  3  . losartan (COZAAR) 50 MG tablet Take 1 tablet (50 mg total) by mouth daily. 90 tablet 3  . Multiple Vitamin (MULTIVITAMIN) tablet Take 1 tablet by mouth daily.    . Multiple Vitamins-Minerals (PRESERVISION AREDS 2 PO) Take 1 tablet by mouth daily.    . Polysaccharide Iron Complex (IRON UP  PO) Take 120 mg by mouth daily.    . vitamin C (ASCORBIC ACID) 500 MG tablet Take 500 mg by mouth daily.    Marland Kitchen zinc gluconate 50 MG tablet Take 50 mg by mouth daily.    . nitroGLYCERIN (NITROSTAT) 0.4 MG SL tablet Place 1 tablet (0.4 mg total) under the tongue every 5 (five) minutes as needed for chest pain. 25 tablet 3   No current facility-administered medications for this visit.     Allergies:   Patient has no allergy information on record.    Social History:  The patient  reports that  has never smoked. he has never used smokeless tobacco.   Family History:  The patient's family history includes Colon cancer in his mother; Diabetes in his paternal grandmother; Rheum arthritis in his father; Tuberculosis in his paternal grandfather.    ROS:  Please see the history of present illness.   Otherwise, review of systems are positive for none.   All other systems are reviewed and negative.    PHYSICAL EXAM: VS:   BP (!) 112/52 (BP Location: Left Arm)   Pulse 62   Ht 5' 9.5" (1.765 m)   Wt 252 lb 6.4 oz (114.5 kg)   BMI 36.74 kg/m  , BMI Body mass index is 36.74 kg/m. GENERAL:  Well appearing HEENT:  Pupils equal round and reactive, fundi not visualized, oral mucosa unremarkable NECK:  No jugular venous distention, waveform within normal limits, carotid upstroke brisk and symmetric, no bruits, no thyromegaly LYMPHATICS:  No cervical, inguinal adenopathy LUNGS:  Clear to auscultation bilaterally BACK:  No CVA tenderness CHEST:  Unremarkable HEART:  PMI not displaced or sustained,S1 and S2 within normal limits, no S3, no S4, no clicks, no rubs, no murmurs ABD:  Flat, positive bowel sounds normal in frequency in pitch, no bruits, no rebound, no guarding, no midline pulsatile mass, no hepatomegaly, no splenomegaly EXT:  2 plus pulses throughout, no edema, no cyanosis no clubbing SKIN:  No rashes no nodules NEURO:  Cranial nerves II through XII grossly intact, motor grossly intact throughout PSYCH:  Cognitively intact, oriented to person place and time    EKG:  EKG is ordered today. The ekg ordered today demonstrates sinus rhythm, rate 62, axis within normal limits, intervals within normal limits, no acute ST-T wave changes.   Recent Labs: No results found for requested labs within last 8760 hours.    Lipid Panel No results found for: CHOL, TRIG, HDL, CHOLHDL, VLDL, LDLCALC, LDLDIRECT    Wt Readings from Last 3 Encounters:  01/10/17 252 lb 6.4 oz (114.5 kg)      Other studies Reviewed: Additional studies/ records that were reviewed today include: Office records and patient records that he brings with him. . Review of the above records demonstrates:  Please see elsewhere in the note.     ASSESSMENT AND PLAN:   CAD: I am going to get his old records.  He has no ongoing symptoms.  I will continue with secondary risk reduction  HTN:  His BP has been elevated and I will increase his  Cozaar to 50 mg daily.  I would like him to have a repeat BMET in about two weeks.   I reviewed a blood pressure diary.  OBESITY:  We talked about diet and exercise and he has joined the [facility] and could get in the pool.  DYSLIPIDEMIA: His LDL was 39.  He will continue on meds as listed.    Current medicines are  reviewed at length with the patient today.  The patient does not have concerns regarding medicines.  The following changes have been made:  As above  Labs/ tests ordered today include: None  Orders Placed This Encounter  Procedures  . Basic Metabolic Panel (BMET)     Disposition:   FU with me in six months.     Signed, Minus Breeding, MD  01/10/2017 11:34 AM    [facility]

## 2017-01-10 ENCOUNTER — Encounter: Payer: Self-pay | Admitting: Cardiology

## 2017-01-10 ENCOUNTER — Ambulatory Visit (INDEPENDENT_AMBULATORY_CARE_PROVIDER_SITE_OTHER): Payer: Medicare Other | Admitting: Cardiology

## 2017-01-10 ENCOUNTER — Ambulatory Visit (HOSPITAL_COMMUNITY)
Admission: RE | Admit: 2017-01-10 | Discharge: 2017-01-10 | Disposition: A | Discharge status: Home / Self Care | Payer: Medicare Other | Source: Ambulatory Visit | Attending: Internal Medicine | Admitting: Internal Medicine

## 2017-01-10 VITALS — BP 112/52 | HR 62 | Ht 69.5 in | Wt 252.4 lb

## 2017-01-10 DIAGNOSIS — I2510 Atherosclerotic heart disease of native coronary artery without angina pectoris: Secondary | ICD-10-CM

## 2017-01-10 DIAGNOSIS — E785 Hyperlipidemia, unspecified: Secondary | ICD-10-CM

## 2017-01-10 DIAGNOSIS — I10 Essential (primary) hypertension: Secondary | ICD-10-CM | POA: Diagnosis not present

## 2017-01-10 DIAGNOSIS — D649 Anemia, unspecified: Secondary | ICD-10-CM | POA: Diagnosis not present

## 2017-01-10 DIAGNOSIS — Z79899 Other long term (current) drug therapy: Secondary | ICD-10-CM

## 2017-01-10 MED ORDER — NITROGLYCERIN 0.4 MG SL SUBL: 0.4000 mg | SUBLINGUAL_TABLET | SUBLINGUAL | 3 refills | Status: DC | PRN

## 2017-01-10 MED ORDER — LOSARTAN POTASSIUM 50 MG PO TABS: 50.0000 mg | ORAL_TABLET | Freq: Every day | ORAL | 3 refills | Status: DC

## 2017-01-10 MED ORDER — SODIUM CHLORIDE 0.9 % IV SOLN
510.0000 mg | INTRAVENOUS | Status: DC
  Administered 2017-01-10: 510 mg via INTRAVENOUS
  Filled 2017-01-10: qty 17

## 2017-01-10 NOTE — Patient Instructions (Signed)
Medication Instructions:  INCREASE- Losartan 50 mg daily  If you need a refill on your cardiac medications before your next appointment, please call your pharmacy.  Labwork: BMP in 2 weeks from PCP HERE IN OUR OFFICE AT [facility]  Take the provided lab slips for you to take with you to the lab for you blood draw.    You will NOT need to fast   Testing/Procedures: None Ordered   Follow-Up: Your physician wants you to follow-up in: 6 Months. You should receive a reminder letter in the mail two months in advance. If you do not receive a letter, please call our office [phone].   Thank you for choosing [facility] at [facility]!!

## 2017-01-15 NOTE — Addendum Note (Signed)
Addended by: Zebedee Iba on: 01/15/2017 02:16 PM   Modules accepted: Orders

## 2017-01-17 ENCOUNTER — Encounter (HOSPITAL_COMMUNITY): Payer: Medicare Other

## 2017-01-23 ENCOUNTER — Other Ambulatory Visit (HOSPITAL_COMMUNITY): Payer: Self-pay

## 2017-01-24 ENCOUNTER — Ambulatory Visit (HOSPITAL_COMMUNITY)
Admission: RE | Admit: 2017-01-24 | Discharge: 2017-01-24 | Disposition: A | Discharge status: Home / Self Care | Payer: Medicare Other | Source: Ambulatory Visit | Attending: Internal Medicine | Admitting: Internal Medicine

## 2017-01-24 DIAGNOSIS — D649 Anemia, unspecified: Secondary | ICD-10-CM | POA: Insufficient documentation

## 2017-01-24 MED ORDER — SODIUM CHLORIDE 0.9 % IV SOLN
510.0000 mg | INTRAVENOUS | Status: AC
  Administered 2017-01-24: 11:00:00 510 mg via INTRAVENOUS
  Filled 2017-01-24: qty 17

## 2017-01-26 DIAGNOSIS — D123 Benign neoplasm of transverse colon: Secondary | ICD-10-CM | POA: Diagnosis present

## 2017-01-26 DIAGNOSIS — K922 Gastrointestinal hemorrhage, unspecified: Secondary | ICD-10-CM | POA: Insufficient documentation

## 2017-01-26 DIAGNOSIS — D122 Benign neoplasm of ascending colon: Secondary | ICD-10-CM | POA: Diagnosis present

## 2017-01-26 DIAGNOSIS — D500 Iron deficiency anemia secondary to blood loss (chronic): Secondary | ICD-10-CM | POA: Diagnosis not present

## 2017-01-26 DIAGNOSIS — Z9884 Bariatric surgery status: Secondary | ICD-10-CM | POA: Diagnosis not present

## 2017-01-26 DIAGNOSIS — Z96641 Presence of right artificial hip joint: Secondary | ICD-10-CM | POA: Diagnosis present

## 2017-01-26 DIAGNOSIS — Z96653 Presence of artificial knee joint, bilateral: Secondary | ICD-10-CM | POA: Diagnosis present

## 2017-01-26 DIAGNOSIS — K5730 Diverticulosis of large intestine without perforation or abscess without bleeding: Secondary | ICD-10-CM | POA: Diagnosis present

## 2017-01-26 DIAGNOSIS — K449 Diaphragmatic hernia without obstruction or gangrene: Secondary | ICD-10-CM | POA: Diagnosis present

## 2017-01-26 DIAGNOSIS — K648 Other hemorrhoids: Secondary | ICD-10-CM | POA: Diagnosis present

## 2017-01-26 DIAGNOSIS — I10 Essential (primary) hypertension: Secondary | ICD-10-CM | POA: Diagnosis not present

## 2017-01-26 DIAGNOSIS — D649 Anemia, unspecified: Secondary | ICD-10-CM | POA: Diagnosis not present

## 2017-01-26 DIAGNOSIS — K644 Residual hemorrhoidal skin tags: Secondary | ICD-10-CM | POA: Diagnosis present

## 2017-01-26 DIAGNOSIS — K210 Gastro-esophageal reflux disease with esophagitis: Secondary | ICD-10-CM | POA: Diagnosis present

## 2017-01-26 DIAGNOSIS — G4733 Obstructive sleep apnea (adult) (pediatric): Secondary | ICD-10-CM | POA: Diagnosis not present

## 2017-01-26 DIAGNOSIS — I25119 Atherosclerotic heart disease of native coronary artery with unspecified angina pectoris: Secondary | ICD-10-CM | POA: Diagnosis not present

## 2017-01-26 DIAGNOSIS — D62 Acute posthemorrhagic anemia: Secondary | ICD-10-CM | POA: Diagnosis present

## 2017-01-26 DIAGNOSIS — D509 Iron deficiency anemia, unspecified: Secondary | ICD-10-CM | POA: Diagnosis not present

## 2017-01-26 DIAGNOSIS — K2960 Other gastritis without bleeding: Secondary | ICD-10-CM | POA: Diagnosis present

## 2017-01-26 DIAGNOSIS — Z955 Presence of coronary angioplasty implant and graft: Secondary | ICD-10-CM | POA: Diagnosis not present

## 2017-01-26 DIAGNOSIS — K921 Melena: Secondary | ICD-10-CM | POA: Diagnosis not present

## 2017-01-26 DIAGNOSIS — M7918 Myalgia, other site: Secondary | ICD-10-CM | POA: Diagnosis not present

## 2017-01-26 DIAGNOSIS — K2950 Unspecified chronic gastritis without bleeding: Secondary | ICD-10-CM | POA: Diagnosis not present

## 2017-01-26 DIAGNOSIS — I2510 Atherosclerotic heart disease of native coronary artery without angina pectoris: Secondary | ICD-10-CM | POA: Diagnosis not present

## 2017-01-26 DIAGNOSIS — E1165 Type 2 diabetes mellitus with hyperglycemia: Secondary | ICD-10-CM | POA: Diagnosis present

## 2017-01-26 DIAGNOSIS — D124 Benign neoplasm of descending colon: Secondary | ICD-10-CM | POA: Diagnosis present

## 2017-01-26 DIAGNOSIS — K2210 Ulcer of esophagus without bleeding: Secondary | ICD-10-CM | POA: Diagnosis not present

## 2017-01-26 DIAGNOSIS — Z01810 Encounter for preprocedural cardiovascular examination: Secondary | ICD-10-CM | POA: Diagnosis not present

## 2017-01-26 DIAGNOSIS — Z7982 Long term (current) use of aspirin: Secondary | ICD-10-CM | POA: Diagnosis not present

## 2017-01-26 DIAGNOSIS — R0602 Shortness of breath: Secondary | ICD-10-CM | POA: Diagnosis not present

## 2017-01-26 DIAGNOSIS — K259 Gastric ulcer, unspecified as acute or chronic, without hemorrhage or perforation: Secondary | ICD-10-CM | POA: Diagnosis not present

## 2017-01-26 DIAGNOSIS — N179 Acute kidney failure, unspecified: Secondary | ICD-10-CM | POA: Diagnosis not present

## 2017-01-26 DIAGNOSIS — K319 Disease of stomach and duodenum, unspecified: Secondary | ICD-10-CM | POA: Diagnosis present

## 2017-01-26 DIAGNOSIS — E782 Mixed hyperlipidemia: Secondary | ICD-10-CM | POA: Diagnosis present

## 2017-01-26 DIAGNOSIS — K3189 Other diseases of stomach and duodenum: Secondary | ICD-10-CM | POA: Diagnosis not present

## 2017-01-26 DIAGNOSIS — R0789 Other chest pain: Secondary | ICD-10-CM | POA: Diagnosis not present

## 2017-01-26 DIAGNOSIS — R1013 Epigastric pain: Secondary | ICD-10-CM | POA: Diagnosis not present

## 2017-01-26 DIAGNOSIS — K621 Rectal polyp: Secondary | ICD-10-CM | POA: Diagnosis present

## 2017-01-26 DIAGNOSIS — K635 Polyp of colon: Secondary | ICD-10-CM | POA: Diagnosis not present

## 2017-01-27 DIAGNOSIS — N179 Acute kidney failure, unspecified: Secondary | ICD-10-CM | POA: Insufficient documentation

## 2017-01-28 DIAGNOSIS — E119 Type 2 diabetes mellitus without complications: Secondary | ICD-10-CM | POA: Insufficient documentation

## 2017-01-30 DIAGNOSIS — E669 Obesity, unspecified: Secondary | ICD-10-CM | POA: Insufficient documentation

## 2017-02-12 DIAGNOSIS — E1129 Type 2 diabetes mellitus with other diabetic kidney complication: Secondary | ICD-10-CM | POA: Diagnosis not present

## 2017-02-12 DIAGNOSIS — Z6837 Body mass index (BMI) 37.0-37.9, adult: Secondary | ICD-10-CM | POA: Diagnosis not present

## 2017-02-12 DIAGNOSIS — L0390 Cellulitis, unspecified: Secondary | ICD-10-CM | POA: Diagnosis not present

## 2017-02-12 DIAGNOSIS — D6489 Other specified anemias: Secondary | ICD-10-CM | POA: Diagnosis not present

## 2017-02-12 DIAGNOSIS — N183 Chronic kidney disease, stage 3 (moderate): Secondary | ICD-10-CM | POA: Diagnosis not present

## 2017-02-12 DIAGNOSIS — R05 Cough: Secondary | ICD-10-CM | POA: Diagnosis not present

## 2017-02-12 DIAGNOSIS — H268 Other specified cataract: Secondary | ICD-10-CM | POA: Diagnosis not present

## 2017-02-12 DIAGNOSIS — J3089 Other allergic rhinitis: Secondary | ICD-10-CM | POA: Diagnosis not present

## 2017-02-12 DIAGNOSIS — G4733 Obstructive sleep apnea (adult) (pediatric): Secondary | ICD-10-CM | POA: Diagnosis not present

## 2017-02-12 DIAGNOSIS — K922 Gastrointestinal hemorrhage, unspecified: Secondary | ICD-10-CM | POA: Diagnosis not present

## 2017-02-12 DIAGNOSIS — E7849 Other hyperlipidemia: Secondary | ICD-10-CM | POA: Diagnosis not present

## 2017-02-12 DIAGNOSIS — J069 Acute upper respiratory infection, unspecified: Secondary | ICD-10-CM | POA: Diagnosis not present

## 2017-02-19 DIAGNOSIS — H2513 Age-related nuclear cataract, bilateral: Secondary | ICD-10-CM | POA: Diagnosis not present

## 2017-02-19 DIAGNOSIS — H353132 Nonexudative age-related macular degeneration, bilateral, intermediate dry stage: Secondary | ICD-10-CM | POA: Diagnosis not present

## 2017-02-19 DIAGNOSIS — H43813 Vitreous degeneration, bilateral: Secondary | ICD-10-CM | POA: Diagnosis not present

## 2017-02-19 DIAGNOSIS — H524 Presbyopia: Secondary | ICD-10-CM | POA: Diagnosis not present

## 2017-02-27 DIAGNOSIS — H25011 Cortical age-related cataract, right eye: Secondary | ICD-10-CM | POA: Diagnosis not present

## 2017-02-27 DIAGNOSIS — H2511 Age-related nuclear cataract, right eye: Secondary | ICD-10-CM | POA: Diagnosis not present

## 2017-02-27 DIAGNOSIS — H25811 Combined forms of age-related cataract, right eye: Secondary | ICD-10-CM | POA: Diagnosis not present

## 2017-03-06 DIAGNOSIS — H25812 Combined forms of age-related cataract, left eye: Secondary | ICD-10-CM | POA: Diagnosis not present

## 2017-03-06 DIAGNOSIS — H25012 Cortical age-related cataract, left eye: Secondary | ICD-10-CM | POA: Diagnosis not present

## 2017-03-06 DIAGNOSIS — H2512 Age-related nuclear cataract, left eye: Secondary | ICD-10-CM | POA: Diagnosis not present

## 2017-03-11 DIAGNOSIS — I10 Essential (primary) hypertension: Secondary | ICD-10-CM | POA: Diagnosis not present

## 2017-03-11 DIAGNOSIS — D649 Anemia, unspecified: Secondary | ICD-10-CM | POA: Diagnosis not present

## 2017-06-17 DIAGNOSIS — I10 Essential (primary) hypertension: Secondary | ICD-10-CM | POA: Diagnosis not present

## 2017-06-17 DIAGNOSIS — E1129 Type 2 diabetes mellitus with other diabetic kidney complication: Secondary | ICD-10-CM | POA: Diagnosis not present

## 2017-06-17 DIAGNOSIS — I2510 Atherosclerotic heart disease of native coronary artery without angina pectoris: Secondary | ICD-10-CM | POA: Diagnosis not present

## 2017-06-17 DIAGNOSIS — K922 Gastrointestinal hemorrhage, unspecified: Secondary | ICD-10-CM | POA: Diagnosis not present

## 2017-06-17 DIAGNOSIS — G4733 Obstructive sleep apnea (adult) (pediatric): Secondary | ICD-10-CM | POA: Diagnosis not present

## 2017-06-17 DIAGNOSIS — Z6838 Body mass index (BMI) 38.0-38.9, adult: Secondary | ICD-10-CM | POA: Diagnosis not present

## 2017-06-17 DIAGNOSIS — N183 Chronic kidney disease, stage 3 (moderate): Secondary | ICD-10-CM | POA: Diagnosis not present

## 2017-06-25 ENCOUNTER — Encounter: Payer: Self-pay | Admitting: Cardiology

## 2017-06-25 NOTE — Progress Notes (Signed)
Cardiology Office Note   Date:  06/26/2017   ID:  Carlos Moreno, DOB July 30, 1945, MRN [id number]  PCP:  Crist Infante, MD  Cardiologist:   Minus Breeding, MD  Referring:  Crist Infante, MD  Chief Complaint  Patient presents with  . Coronary Artery Disease      History of Present Illness: Carlos Moreno is a 72 y.o. male who is referred by Dr. Dr. Joylene Draft for evaluation of CAD.  He had distant stenting in 2005.  He also had more recent stenting in 2007.  He thinks this was to his LAD.   I do not have the specific records.  The first time he was being screened for gastric surgery and had an abnormal stress test.  The second time he had some vague chest discomfort and was found to have a lesion.  He said he is done well with his angioplasties.  At the last visit I increased his Cozaar because of his BP.    Since I last saw him he was traveling in [place].  He felt very fatigued and was subsequently found to be significantly anemic.  He was hospitalized and taken off of his aspirin.  He had a complete GI work-up but they could not find the source although it was assumed he had some ulceration probably at the site of a gastric surgery anastomosis.  He subsequently had a stabilization of his hemoglobin was started back on aspirin.  Of note with all of this he had no cardiovascular complications.  He had some chest discomfort that went away when he was transfused.  He denies any palpitations, presyncope or syncope.  He does not have any ongoing shortness of breath, chest discomfort, neck or arm discomfort.  Is had no weight gain or edema.    Past Medical History:  Diagnosis Date  . Anemia   . Basal cell carcinoma   . CAD (coronary artery disease)    Stent 2017.  Stent 2005 (Details of both stents pending)  . Cataract   . Curvature of spine   . Hypertension   . Macular degeneration   . Obesity   . OSA (obstructive sleep apnea)    CPAP    Past Surgical History:  Procedure  Laterality Date  . ABDOMINOPLASTY  02/2009  . ANGIOPLASTY  06/05 05/17   with Stents  . CHOLECYSTECTOMY  2001  . COLONOSCOPY  08/07 11/12 04/16   . GASTRIC BYPASS  10/07/2006  . HERNIA REPAIR Left 1956  . KNEE ARTHROSCOPY Right 04/07 12/14 05/18    x3  . LAMINECTOMY     Lumbar  . REPLACEMENT TOTAL KNEE Left 1994  . TOTAL HIP ARTHROPLASTY  01/2007     Current Outpatient Medications  Medication Sig Dispense Refill  . aspirin EC 81 MG tablet Take 81 mg by mouth daily.    Marland Kitchen atorvastatin (LIPITOR) 40 MG tablet Take 40 mg by mouth daily.  3  . b complex vitamins capsule Take 1 capsule by mouth as directed.    Marland Kitchen BYSTOLIC 5 MG tablet Take 5 mg by mouth daily.  3  . calcium gluconate 500 MG tablet Take 1 tablet by mouth daily.    . cetirizine (ZYRTEC) 10 MG tablet Take 10 mg by mouth daily.    . Cholecalciferol (VITAMIN D3) 5000 units CAPS Take 1 capsule by mouth daily.    . Cyanocobalamin (B-12) 5000 MCG SUBL Place 1 tablet under the tongue as directed.    . Diphenhydramine-APAP, sleep, (TYLENOL PM  EXTRA STRENGTH PO) Take 1 tablet by mouth daily.    . isosorbide mononitrate (IMDUR) 30 MG 24 hr tablet Take 30 mg by mouth daily.  3  . losartan (COZAAR) 50 MG tablet Take 1 tablet (50 mg total) by mouth daily. 90 tablet 3  . Multiple Vitamin (MULTIVITAMIN) tablet Take 1 tablet by mouth daily.    . Multiple Vitamins-Minerals (PRESERVISION AREDS 2 PO) Take 1 tablet by mouth daily.    . Polysaccharide Iron Complex (IRON UP PO) Take 120 mg by mouth daily.    . traMADol (ULTRAM) 50 MG tablet Take 50 mg by mouth every 6 (six) hours as needed.    . vitamin C (ASCORBIC ACID) 500 MG tablet Take 500 mg by mouth daily.    Marland Kitchen zinc gluconate 50 MG tablet Take 50 mg by mouth daily.    . nitroGLYCERIN (NITROSTAT) 0.4 MG SL tablet Place 1 tablet (0.4 mg total) under the tongue every 5 (five) minutes as needed for chest pain. 25 tablet 3   No current facility-administered medications for this visit.      Allergies:   Patient has no known allergies.    ROS:  Please see the history of present illness.   Otherwise, review of systems are positive for none.   All other systems are reviewed and negative.    PHYSICAL EXAM: VS:  BP 126/64 (BP Location: Left Arm, Patient Position: Sitting, Cuff Size: Large)   Pulse (!) 56   Ht 5\' 10"  (1.778 m)   Wt 263 lb (119.3 kg)   BMI 37.74 kg/m  , BMI Body mass index is 37.74 kg/m.  GENERAL:  Well appearing NECK:  No jugular venous distention, waveform within normal limits, carotid upstroke brisk and symmetric, no bruits, no thyromegaly LUNGS:  Clear to auscultation bilaterally CHEST:  Unremarkable HEART:  PMI not displaced or sustained,S1 and S2 within normal limits, no S3, no S4, no clicks, no rubs, no murmurs ABD:  Flat, positive bowel sounds normal in frequency in pitch, no bruits, no rebound, no guarding, no midline pulsatile mass, no hepatomegaly, no splenomegaly EXT:  2 plus pulses throughout, no edema, no cyanosis no clubbing   EKG:  EKG is  ordered today.    Recent Labs: No results found for requested labs within last 8760 hours.    Lipid Panel No results found for: CHOL, TRIG, HDL, CHOLHDL, VLDL, LDLCALC, LDLDIRECT    Wt Readings from Last 3 Encounters:  06/26/17 263 lb (119.3 kg)  01/10/17 252 lb 6.4 oz (114.5 kg)      Other studies Reviewed: Additional studies/ records that were reviewed today include:  None. Review of the above records demonstrates:       ASSESSMENT AND PLAN:   CAD:   The patient has no ongoing chest pain despite his recent severe anemia.  No further work up is planned.   He will continue with risk reduction.   HTN:  His BP is well controlled.   I reviewed her blood pressure diary.  No change in therapy.    OBESITY:    We talked about weight loss again.   DYSLIPIDEMIA:     LDL was 39 last fall.  No change in therapy.   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  No change in therapy.     Current medicines are  reviewed at length with the patient today.  The patient does not have concerns regarding medicines.  The following changes have been made:  None  Labs/ tests  ordered today include: None   No orders of the defined types were placed in this encounter.    Disposition:   FU with me in 12 months.     Signed, Minus Breeding, MD  06/26/2017 10:03 AM    [facility]

## 2017-06-26 ENCOUNTER — Ambulatory Visit (INDEPENDENT_AMBULATORY_CARE_PROVIDER_SITE_OTHER): Payer: Medicare Other | Admitting: Cardiology

## 2017-06-26 ENCOUNTER — Encounter: Payer: Self-pay | Admitting: Cardiology

## 2017-06-26 VITALS — BP 126/64 | HR 56 | Ht 70.0 in | Wt 263.0 lb

## 2017-06-26 DIAGNOSIS — I2510 Atherosclerotic heart disease of native coronary artery without angina pectoris: Secondary | ICD-10-CM | POA: Diagnosis not present

## 2017-06-26 DIAGNOSIS — I10 Essential (primary) hypertension: Secondary | ICD-10-CM

## 2017-06-26 DIAGNOSIS — E785 Hyperlipidemia, unspecified: Secondary | ICD-10-CM

## 2017-06-26 NOTE — Patient Instructions (Signed)
Medication Instructions:  Continue current medications  If you need a refill on your cardiac medications before your next appointment, please call your pharmacy.  Labwork: None Ordered   Testing/Procedures: None Ordered  Follow-Up: Your physician wants you to follow-up in: 1 Year. You should receive a reminder letter in the mail two months in advance. If you do not receive a letter, please call our office [phone].    Thank you for choosing [facility] at [facility]!!      

## 2017-09-24 DIAGNOSIS — N183 Chronic kidney disease, stage 3 (moderate): Secondary | ICD-10-CM | POA: Diagnosis not present

## 2017-09-24 DIAGNOSIS — E1129 Type 2 diabetes mellitus with other diabetic kidney complication: Secondary | ICD-10-CM | POA: Diagnosis not present

## 2017-10-17 DIAGNOSIS — Z23 Encounter for immunization: Secondary | ICD-10-CM | POA: Diagnosis not present

## 2017-12-22 DIAGNOSIS — Z96641 Presence of right artificial hip joint: Secondary | ICD-10-CM | POA: Insufficient documentation

## 2017-12-22 DIAGNOSIS — M25551 Pain in right hip: Secondary | ICD-10-CM | POA: Diagnosis not present

## 2017-12-31 ENCOUNTER — Ambulatory Visit: Payer: Medicare Other | Attending: Orthopedic Surgery | Admitting: Physical Therapy

## 2017-12-31 ENCOUNTER — Encounter: Payer: Self-pay | Admitting: Physical Therapy

## 2017-12-31 DIAGNOSIS — M545 Low back pain: Secondary | ICD-10-CM | POA: Insufficient documentation

## 2017-12-31 DIAGNOSIS — G8929 Other chronic pain: Secondary | ICD-10-CM | POA: Insufficient documentation

## 2017-12-31 DIAGNOSIS — M25551 Pain in right hip: Secondary | ICD-10-CM | POA: Insufficient documentation

## 2017-12-31 DIAGNOSIS — R262 Difficulty in walking, not elsewhere classified: Secondary | ICD-10-CM | POA: Diagnosis not present

## 2017-12-31 NOTE — Therapy (Signed)
[address] [place], [place], [address] Phone: [phone]   Fax:  [phone]  Physical Therapy Evaluation  Patient Details  Name: Carlos Moreno MRN: [id number] Date of Birth: 1945/10/13 Referring Provider (PT): Alvan Dame MD   Encounter Date: 12/31/2017  PT End of Session - 12/31/17 1051    Visit Number  1    Number of Visits  12    Date for PT Re-Evaluation  02/11/18    Authorization Type  MCR/AARP    PT Start Time  0855    PT Stop Time  0940    PT Time Calculation (min)  45 min    Activity Tolerance  Patient tolerated treatment well    Behavior During Therapy  [facility] for tasks assessed/performed       Past Medical History:  Diagnosis Date  . Anemia   . Basal cell carcinoma   . CAD (coronary artery disease)    Stent 2017.  Stent 2005 (Details of both stents pending)  . Cataract   . Curvature of spine   . Hypertension   . Macular degeneration   . Obesity   . OSA (obstructive sleep apnea)    CPAP    Past Surgical History:  Procedure Laterality Date  . ABDOMINOPLASTY  02/2009  . ANGIOPLASTY  06/05 05/17   with Stents  . CHOLECYSTECTOMY  2001  . COLONOSCOPY  08/07 11/12 04/16   . GASTRIC BYPASS  10/07/2006  . HERNIA REPAIR Left 1956  . KNEE ARTHROSCOPY Right 04/07 12/14 05/18    x3  . LAMINECTOMY     Lumbar  . REPLACEMENT TOTAL KNEE Left 1994  . TOTAL HIP ARTHROPLASTY  01/2007    There were no vitals filed for this visit.   Subjective Assessment - 12/31/17 0903    Subjective  Pt reports long ortho history of bilat TKA, Rt THA, and lumbar laminectomy. He is now having increased Rt hip and back pain, MD thought maybe bursitis and gave him injection. Injection helped a few days but then pain came back last night.  He has had recent x-rays which he relays showed nothing wrong with Rt THA.    Pertinent History  QBH:ALPFXT,KWI,OXB,DZH,GD TKA 94, Rt TKA with 3 revisions last 5/18, lumbar  laminectomy 2009.    Limitations  Lifting;Standing;Walking    How long can you walk comfortably?  grocery store distance    Diagnostic tests  x-rays neg and show everything is good with Rt THA    Patient Stated Goals  get the pain down    Currently in Pain?  Yes    Pain Score  6     Pain Orientation  Right    Pain Descriptors / Indicators  Sharp;Tightness;Aching    Pain Type  Chronic pain    Pain Onset  More than a month ago    Pain Frequency  Intermittent    Aggravating Factors   any activity or movement, putting on shoes and socks and pants    Pain Relieving Factors  stiff drinks, tramadol,has not tried heat or ice, has TENS at home but has not helped much         [facility] PT Assessment - 12/31/17 0001      Assessment   Medical Diagnosis  Rt hip pain,bursitis, THA 2008, LBP    Referring Provider (PT)  Alvan Dame MD    Onset Date/Surgical Date  --   acute on chronic   Next MD Visit  01/28/18  Prior Therapy  PT for knees and hip      Precautions   Precautions  --   Rt posterior hip but from 2008, recent x-rays neg     Restrictions   Weight Bearing Restrictions  No      Balance Screen   Has the patient fallen in the past 6 months  Yes    How many times?  1    Has the patient had a decrease in activity level because of a fear of falling?   No    Is the patient reluctant to leave their home because of a fear of falling?   No      Home Film/video editor residence    Additional Comments  single level      Prior Function   Level of Independence  Independent with basic ADLs   sometimes needs help with putting on socks and shoes   Vocation  Retired      Associate Professor   Overall Cognitive Status  Within Functional Limits for tasks assessed      Observation/Other Assessments   Focus on Therapeutic Outcomes (FOTO)   not done due to time constraints, may do next visit      Sensation   Light Touch  Appears Intact      ROM / Strength   AROM / PROM / Strength   AROM;Strength      AROM   AROM Assessment Site  Hip    Right/Left Hip  Right    Right Hip Flexion  85    Right Hip External Rotation   10    Right Hip Internal Rotation   30    Right Hip ABduction  --   WFL   Right Hip ADduction  --   [facility]     Strength   Strength Assessment Site  Hip;Knee    Right/Left Hip  Right    Right Hip Flexion  4/5    Right Hip Extension  4/5    Right Hip External Rotation   4/5    Right Hip Internal Rotation  4+/5    Right Hip ABduction  4+/5    Right/Left Knee  Right    Right Knee Flexion  5/5    Right Knee Extension  5/5      Flexibility   Soft Tissue Assessment /Muscle Length  --   tight glutes and H.S on Rt     Palpation   Palpation comment  TTP Rt glutes, HS, over ischial tuberiosity, SI jt and greater troc      Ambulation/Gait   Gait Comments  antalgic gait on Rt LE                Objective measurements completed on examination: See above findings.      [place] Adult PT Treatment/Exercise - 12/31/17 0001      Self-Care   Self-Care  --   HEP, edu, heat before exercise and ice after     Modalities   Modalities  Cryotherapy      Cryotherapy   Number Minutes Cryotherapy  10 Minutes    Cryotherapy Location  Hip    Type of Cryotherapy  Ice pack             PT Education - 12/31/17 1051    Education Details  HEP, POC, heat/ice    Person(s) Educated  Patient    Methods  Explanation;Demonstration;Verbal cues;Handout    Comprehension  Verbalized understanding;Need further  instruction          PT Long Term Goals - 12/31/17 1101      PT LONG TERM GOAL #1   Title  Pt will be I and compliant with HEP. 6 weeks 02/11/17    Status  New      PT LONG TERM GOAL #2   Title  Pt will increase Rt hip ROM to [facility].  6 weeks 02/11/17    Status  New      PT LONG TERM GOAL #3   Title  Pt will improve Rt hip strength to at least 4+/5 all planes.  6 weeks 02/11/17    Status  New      PT LONG TERM GOAL #4   Title  Pt will report  overall less than 3/10 pain with usual activity.  6 weeks 02/11/17             Plan - 12/31/17 1055    Clinical Impression Statement  Pt presents with Rt hip pain, bursitis, and glute tendonopathy/strain. He has complex ortho history of Rt THA(2008), bilat TKA with 3 revisions on Rt (last one in 2018), and lumbar laminetomy (2009). He has LBP, decreased hip ROM and strength, increased tightness and tenderness in glutes, decreaesd activity tolerance and increased pain limiting his full funciton. He will benefit from skilled PT to address his deficts.     History and Personal Factors relevant to plan of care:  PMH:Rt TKA, anemia,CAD,HTN,OSA,Lt TKA 94, Rt TKA with 3 revisions last 5/18, lumbar laminectomy 2009.    Clinical Presentation  Evolving    Clinical Presentation due to:  complex history with acute on chronic pain    Clinical Decision Making  Moderate    Rehab Potential  Good    Clinical Impairments Affecting Rehab Potential  acute on chronic nature of pain, presence of Rt THA and bilat TKA    PT Frequency  2x / week    PT Duration  6 weeks    PT Treatment/Interventions  Cryotherapy;Electrical Stimulation;Iontophoresis 4mg /ml Dexamethasone;Moist Heat;Ultrasound;Gait training;Therapeutic exercise;Therapeutic activities;Neuromuscular re-education;Passive range of motion;Dry needling    PT Next Visit Plan  review HEP, hip, glute, lumbar stretching and strengthening. Consider IONTO, MT, modalties, DN as needed.    Consulted and Agree with Plan of Care  Patient       Patient will benefit from skilled therapeutic intervention in order to improve the following deficits and impairments:  Decreased activity tolerance, Decreased endurance, Decreased range of motion, Decreased strength, Difficulty walking, Increased muscle spasms, Impaired flexibility, Hypomobility, Obesity, Pain  Visit Diagnosis: Pain in right hip  Difficulty in walking, not elsewhere classified  Chronic right-sided low back  pain without sciatica     Problem List There are no active problems to display for this patient.   Debbe Odea, PT,DPT 12/31/2017, 11:07 AM  [address] [place], [place], [address] Phone: [phone]   Fax:  [phone]  Name: Carlos Moreno MRN: [id number] Date of Birth: 1945-05-14

## 2018-01-06 ENCOUNTER — Encounter: Payer: Self-pay | Admitting: Physical Therapy

## 2018-01-06 ENCOUNTER — Ambulatory Visit: Payer: Medicare Other | Admitting: Physical Therapy

## 2018-01-06 DIAGNOSIS — M545 Low back pain: Secondary | ICD-10-CM | POA: Diagnosis not present

## 2018-01-06 DIAGNOSIS — R262 Difficulty in walking, not elsewhere classified: Secondary | ICD-10-CM

## 2018-01-06 DIAGNOSIS — G8929 Other chronic pain: Secondary | ICD-10-CM

## 2018-01-06 DIAGNOSIS — M25551 Pain in right hip: Secondary | ICD-10-CM

## 2018-01-06 NOTE — Therapy (Signed)
[place], [place], [address] Phone: [phone]   Fax:  [phone]  Physical Therapy Treatment  Patient Details  Name: Carlos Moreno MRN: [id number] Date of Birth: Mar 14, 1945 Referring Provider (PT): Alvan Dame MD   Encounter Date: 01/06/2018  PT End of Session - 01/06/18 0921    Visit Number  2    Date for PT Re-Evaluation  02/11/18    PT Start Time  0835    PT Stop Time  0920    PT Time Calculation (min)  45 min    Activity Tolerance  Patient tolerated treatment well    Behavior During Therapy  [facility] for tasks assessed/performed       Past Medical History:  Diagnosis Date  . Anemia   . Basal cell carcinoma   . CAD (coronary artery disease)    Stent 2017.  Stent 2005 (Details of both stents pending)  . Cataract   . Curvature of spine   . Hypertension   . Macular degeneration   . Obesity   . OSA (obstructive sleep apnea)    CPAP    Past Surgical History:  Procedure Laterality Date  . ABDOMINOPLASTY  02/2009  . ANGIOPLASTY  06/05 05/17   with Stents  . CHOLECYSTECTOMY  2001  . COLONOSCOPY  08/07 11/12 04/16   . GASTRIC BYPASS  10/07/2006  . HERNIA REPAIR Left 1956  . KNEE ARTHROSCOPY Right 04/07 12/14 05/18    x3  . LAMINECTOMY     Lumbar  . REPLACEMENT TOTAL KNEE Left 1994  . TOTAL HIP ARTHROPLASTY  01/2007    There were no vitals filed for this visit.  Subjective Assessment - 01/06/18 0837    Subjective  Pt reports that last night was terrible with increase R hip pain.     Currently in Pain?  Yes    Pain Score  6     Pain Orientation  Right    Aggravating Factors   Putting on socks and shoes                       [facility] Adult PT Treatment/Exercise - 01/06/18 0001      High Level Balance   High Level Balance Activities  Side stepping      Exercises   Exercises  Knee/Hip      Knee/Hip Exercises: Aerobic   Nustep  L4 x 6 min LE only       Knee/Hip  Exercises: Machines for Strengthening   Cybex Knee Extension  RLE 15lb 3x10     Cybex Knee Flexion  35lb x20, 45lb x20, RLE 25lb x10      Knee/Hip Exercises: Standing   Hip Extension  Both;2 sets;10 reps;Knee straight      Knee/Hip Exercises: Seated   Ball Squeeze  2x10 3''    Sit to Sand  2 sets;5 reps;without UE support      Modalities   Modalities  Iontophoresis      Iontophoresis   Type of Iontophoresis  Dexamethasone    Location  R hip    Dose  71mL    Time  4 hour patch       Manual Therapy   Manual Therapy  Passive ROM    Passive ROM  R hip all directions                  PT Long Term Goals - 01/06/18 4431  PT LONG TERM GOAL #1   Title  Pt will be I and compliant with HEP. 6 weeks 02/11/17    Status  Achieved   removed piriformis stretch too much pressure on knees           Plan - 01/06/18 0924    Clinical Impression Statement  No issue with Nustep warm up. Some compensation with sit to stand, he tens to lean away from R hip when standing. Good strength with curls and extensions. Also no issues with side stepping and hip extensions. Pt guarded at times during PROM needing cues to relax. Some density in tissue of lateral hip that's causes pain with palpation.     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 Consider IONTO, MT, modalties, DN as needed.       Patient will benefit from skilled therapeutic intervention in order to improve the following deficits and impairments:  Decreased activity tolerance, Decreased endurance, Decreased range of motion, Decreased strength, Difficulty walking, Increased muscle spasms, Impaired flexibility, Hypomobility, Obesity, Pain  Visit Diagnosis: Pain in right hip  Difficulty in walking, not  elsewhere classified  Chronic right-sided low back pain without sciatica     Problem List There are no active problems to display for this patient.   Scot Jun 01/06/2018, 9:27 AM  [place], [place], [address] Phone: [phone]   Fax:  [phone]  Name: Carlos Moreno MRN: [id number] Date of Birth: 04/03/1945

## 2018-01-12 ENCOUNTER — Ambulatory Visit: Payer: Medicare Other | Attending: Orthopedic Surgery | Admitting: Physical Therapy

## 2018-01-12 DIAGNOSIS — G8929 Other chronic pain: Secondary | ICD-10-CM

## 2018-01-12 DIAGNOSIS — M545 Low back pain, unspecified: Secondary | ICD-10-CM

## 2018-01-12 DIAGNOSIS — M25551 Pain in right hip: Secondary | ICD-10-CM | POA: Insufficient documentation

## 2018-01-12 DIAGNOSIS — R262 Difficulty in walking, not elsewhere classified: Secondary | ICD-10-CM

## 2018-01-12 NOTE — Therapy (Signed)
[place], [place], [address] Phone: [phone]   Fax:  [phone]  Physical Therapy Treatment  Patient Details  Name: Carlos Moreno MRN: [id number] Date of Birth: 06-06-1945 Referring Provider (PT): Alvan Dame MD   Encounter Date: 01/12/2018  PT End of Session - 01/12/18 0925    Visit Number  3    Number of Visits  12    Date for PT Re-Evaluation  02/11/18    Authorization Type  MCR/AARP    PT Start Time  0845    PT Stop Time  0930    PT Time Calculation (min)  45 min    Activity Tolerance  Patient tolerated treatment well    Behavior During Therapy  [facility] for tasks assessed/performed       Past Medical History:  Diagnosis Date  . Anemia   . Basal cell carcinoma   . CAD (coronary artery disease)    Stent 2017.  Stent 2005 (Details of both stents pending)  . Cataract   . Curvature of spine   . Hypertension   . Macular degeneration   . Obesity   . OSA (obstructive sleep apnea)    CPAP    Past Surgical History:  Procedure Laterality Date  . ABDOMINOPLASTY  02/2009  . ANGIOPLASTY  06/05 05/17   with Stents  . CHOLECYSTECTOMY  2001  . COLONOSCOPY  08/07 11/12 04/16   . GASTRIC BYPASS  10/07/2006  . HERNIA REPAIR Left 1956  . KNEE ARTHROSCOPY Right 04/07 12/14 05/18    x3  . LAMINECTOMY     Lumbar  . REPLACEMENT TOTAL KNEE Left 1994  . TOTAL HIP ARTHROPLASTY  01/2007    There were no vitals filed for this visit.  Subjective Assessment - 01/12/18 0850    Subjective  "Not doing to bad, that ionto patch really helped"    Currently in Pain?  Yes    Pain Score  2     Pain Location  Hip    Pain Orientation  Right                       [facility] Adult PT Treatment/Exercise - 01/12/18 0001      Exercises   Exercises  Knee/Hip      Knee/Hip Exercises: Aerobic   Nustep  L4 x 7 min LE only       Knee/Hip Exercises: Machines for Strengthening   Cybex Knee Extension   RLE 20lb 3x10     Cybex Knee Flexion  45 lb 3x10      Knee/Hip Exercises: Standing   Hip Abduction  Both;2 sets;10 reps;Knee straight   2# ankle weights   Hip Extension  Both;10 reps;Knee straight;2 sets   2# ankle weights     Knee/Hip Exercises: Seated   Ball Squeeze  2x10 3''    Clamshell with TheraBand  Green   2x15   Sit to Sand  5 reps;without UE support;3 sets      Modalities   Modalities  Iontophoresis      Iontophoresis   Type of Iontophoresis  Dexamethasone    Location  R hip    Dose  72mL    Time  4 hour patch       Manual Therapy   Manual Therapy  Passive ROM    Passive ROM  R hip all directions  PT Long Term Goals - 01/12/18 0929      PT LONG TERM GOAL #4   Title  Pt will report overall less than 3/10 pain with usual activity.  6 weeks 02/11/17    Status  Achieved            Plan - 01/12/18 0926    Clinical Impression Statement  Pt continues to compensate with STS and demonstrated a L lean. Pt tolerated progression of weight and reps well in todays session. Pt required VC with all cybex exercises to make reps slow and controlled. Pt reported that pain in not bad with ADL. Pt had some pain in R hip at end ranges during R hip PROM.     Rehab Potential  Good    Clinical Impairments Affecting Rehab Potential  acute on chronic nature of pain, presence of Rt THA and bilat TKA    PT Frequency  2x / week    PT Duration  6 weeks    PT Treatment/Interventions  Cryotherapy;Electrical Stimulation;Iontophoresis 4mg /ml Dexamethasone;Moist Heat;Ultrasound;Gait training;Therapeutic exercise;Therapeutic activities;Neuromuscular re-education;Passive range of motion;Dry needling    PT Next Visit Plan  Continue PROM and progressing strength     Consulted and Agree with Plan of Care  Patient       Patient will benefit from skilled therapeutic intervention in order to improve the following deficits and impairments:  Decreased activity tolerance,  Decreased endurance, Decreased range of motion, Decreased strength, Difficulty walking, Increased muscle spasms, Impaired flexibility, Hypomobility, Obesity, Pain  Visit Diagnosis: Pain in right hip  Difficulty in walking, not elsewhere classified  Chronic right-sided low back pain without sciatica     Problem List There are no active problems to display for this patient.   Howell Rucks, SPTA 01/12/2018, 9:33 AM  [place] [address] [place], [place], [address] Phone: [phone]   Fax:  [phone]  Name: Carlos Moreno MRN: [id number] Date of Birth: 1945-12-28

## 2018-01-16 ENCOUNTER — Encounter: Payer: Self-pay | Admitting: Physical Therapy

## 2018-01-16 ENCOUNTER — Ambulatory Visit: Payer: Medicare Other | Admitting: Physical Therapy

## 2018-01-16 DIAGNOSIS — M25551 Pain in right hip: Secondary | ICD-10-CM | POA: Diagnosis not present

## 2018-01-16 DIAGNOSIS — M545 Low back pain: Principal | ICD-10-CM

## 2018-01-16 DIAGNOSIS — R262 Difficulty in walking, not elsewhere classified: Secondary | ICD-10-CM | POA: Diagnosis not present

## 2018-01-16 DIAGNOSIS — G8929 Other chronic pain: Secondary | ICD-10-CM

## 2018-01-16 NOTE — Therapy (Signed)
[place], [place], [address] Phone: [phone]   Fax:  [phone]  Physical Therapy Treatment  Patient Details  Name: Carlos Moreno MRN: [id number] Date of Birth: Jul 13, 1945 Referring Provider (PT): Alvan Dame MD   Encounter Date: 01/16/2018  PT End of Session - 01/16/18 0919    Visit Number  4    Date for PT Re-Evaluation  02/11/18    Authorization Type  MCR/AARP    PT Start Time  0837    PT Stop Time  0920    PT Time Calculation (min)  43 min    Activity Tolerance  Patient tolerated treatment well    Behavior During Therapy  [facility] for tasks assessed/performed       Past Medical History:  Diagnosis Date  . Anemia   . Basal cell carcinoma   . CAD (coronary artery disease)    Stent 2017.  Stent 2005 (Details of both stents pending)  . Cataract   . Curvature of spine   . Hypertension   . Macular degeneration   . Obesity   . OSA (obstructive sleep apnea)    CPAP    Past Surgical History:  Procedure Laterality Date  . ABDOMINOPLASTY  02/2009  . ANGIOPLASTY  06/05 05/17   with Stents  . CHOLECYSTECTOMY  2001  . COLONOSCOPY  08/07 11/12 04/16   . GASTRIC BYPASS  10/07/2006  . HERNIA REPAIR Left 1956  . KNEE ARTHROSCOPY Right 04/07 12/14 05/18    x3  . LAMINECTOMY     Lumbar  . REPLACEMENT TOTAL KNEE Left 1994  . TOTAL HIP ARTHROPLASTY  01/2007    There were no vitals filed for this visit.  Subjective Assessment - 01/16/18 0834    Subjective  Pt reports some pain and discomfort in R hip    Currently in Pain?  Yes    Pain Score  4     Pain Location  Hip    Pain Orientation  Right                       [facility] Adult PT Treatment/Exercise - 01/16/18 0001      Exercises   Exercises  Knee/Hip      Knee/Hip Exercises: Aerobic   Nustep  L4 x 7 min LE only       Knee/Hip Exercises: Machines for Strengthening   Cybex Knee Extension  RLE 20lb 3x10     Cybex Knee  Flexion  45 lb 3x10    Cybex Leg Press  60lb 2x10, RLE 20lb 2x10       Knee/Hip Exercises: Standing   Lateral Step Up  Both;1 set;Step Height: 4";Hand Hold: 1;5 reps    Other Standing Knee Exercises  resisted side step 30lb x4 each      Knee/Hip Exercises: Seated   Ball Squeeze  2x10 3''      Iontophoresis   Type of Iontophoresis  Dexamethasone    Location  R hip    Dose  64mL    Time  4 hour patch       Manual Therapy   Manual Therapy  Passive ROM    Passive ROM  R hip all directions                  PT Long Term Goals - 01/12/18 0929      PT LONG TERM GOAL #4   Title  Pt will report overall  less than 3/10 pain with usual activity.  6 weeks 02/11/17    Status  Achieved            Plan - 01/16/18 0920    Clinical Impression Statement  Pt with some weakness and instability with resisted gait when the resistance is on R side. Weakness also exposes when attempted to do a lateral 6 inch step up. He was surprised about the weakness in his R hip. He did do well with lateral step up using the 4 inch step. Reports some discomfort in R knee with single leg on leg press.     Rehab Potential  Good    Clinical Impairments Affecting Rehab Potential  acute on chronic nature of pain, presence of Rt THA and bilat TKA    PT Frequency  2x / week    PT Duration  6 weeks    PT Treatment/Interventions  Cryotherapy;Electrical Stimulation;Iontophoresis 4mg /ml Dexamethasone;Moist Heat;Ultrasound;Gait training;Therapeutic exercise;Therapeutic activities;Neuromuscular re-education;Passive range of motion;Dry needling    PT Next Visit Plan  Continue PROM and progressing strength        Patient will benefit from skilled therapeutic intervention in order to improve the following deficits and impairments:  Decreased activity tolerance, Decreased endurance, Decreased range of motion, Decreased strength, Difficulty walking, Increased muscle spasms, Impaired flexibility, Hypomobility, Obesity,  Pain  Visit Diagnosis: Chronic right-sided low back pain without sciatica  Pain in right hip  Difficulty in walking, not elsewhere classified     Problem List There are no active problems to display for this patient.   Scot Jun, PTA 01/16/2018, 9:24 AM  [place], [place], [address] Phone: [phone]   Fax:  [phone]  Name: Carlos Moreno MRN: [id number] Date of Birth: 02-13-1945

## 2018-01-19 ENCOUNTER — Ambulatory Visit: Payer: Medicare Other | Admitting: Physical Therapy

## 2018-01-19 DIAGNOSIS — M25551 Pain in right hip: Secondary | ICD-10-CM | POA: Diagnosis not present

## 2018-01-19 DIAGNOSIS — G8929 Other chronic pain: Secondary | ICD-10-CM

## 2018-01-19 DIAGNOSIS — M545 Low back pain: Secondary | ICD-10-CM | POA: Diagnosis not present

## 2018-01-19 DIAGNOSIS — R262 Difficulty in walking, not elsewhere classified: Secondary | ICD-10-CM

## 2018-01-19 NOTE — Therapy (Signed)
[place], [place], [address] Phone: [phone]   Fax:  [phone]  Physical Therapy Treatment  Patient Details  Name: Carlos Moreno MRN: [id number] Date of Birth: [birth year] Referring Provider (PT): Alvan Dame MD   Encounter Date: 01/19/2018  PT End of Session - 01/19/18 0928    Visit Number  5    Number of Visits  12    Date for PT Re-Evaluation  02/11/18    Authorization Type  MCR/AARP    PT Start Time  0845    PT Stop Time  0930    PT Time Calculation (min)  45 min    Activity Tolerance  Patient tolerated treatment well    Behavior During Therapy  [facility] for tasks assessed/performed       Past Medical History:  Diagnosis Date  . Anemia   . Basal cell carcinoma   . CAD (coronary artery disease)    Stent 2017.  Stent 2005 (Details of both stents pending)  . Cataract   . Curvature of spine   . Hypertension   . Macular degeneration   . Obesity   . OSA (obstructive sleep apnea)    CPAP    Past Surgical History:  Procedure Laterality Date  . ABDOMINOPLASTY  02/2009  . ANGIOPLASTY  06/05 05/17   with Stents  . CHOLECYSTECTOMY  2001  . COLONOSCOPY  08/07 11/12 04/16   . GASTRIC BYPASS  10/07/2006  . HERNIA REPAIR Left 1956  . KNEE ARTHROSCOPY Right 04/07 12/14 05/18    x3  . LAMINECTOMY     Lumbar  . REPLACEMENT TOTAL KNEE Left 1994  . TOTAL HIP ARTHROPLASTY  01/2007    There were no vitals filed for this visit.  Subjective Assessment - 01/19/18 0854    Subjective  "Im doing ok, I had to fix a leak under the sink and that was hard"     Currently in Pain?  Yes    Pain Score  3     Pain Location  Hip    Pain Orientation  Right                       [facility] Adult PT Treatment/Exercise - 01/19/18 0001      Knee/Hip Exercises: Aerobic   Nustep  L4 x 7 min LE only       Knee/Hip Exercises: Machines for Strengthening   Cybex Knee Extension  RLE 20# 3x10     Cybex  Knee Flexion  55# 3x10    Cybex Leg Press  60# 3x10      Knee/Hip Exercises: Standing   Lateral Step Up  Right;2 sets;10 reps;Step Height: 4"    Other Standing Knee Exercises  resisted side step 30lb x4 each      Iontophoresis   Type of Iontophoresis  Dexamethasone    Location  R hip    Dose  36mL    Time  4 hour patch       Manual Therapy   Manual Therapy  Passive ROM    Passive ROM  R hip all directions                  PT Long Term Goals - 01/19/18 0932      PT LONG TERM GOAL #4   Title  Pt will report overall less than 3/10 pain with usual activity.  6 weeks 02/11/17    Status  Achieved  Plan - 01/19/18 0929    Clinical Impression Statement  Pt demonstrated increased LE strength as seen by increased weight in todays session as compared to last. Pt required min VC to keep exercises slow and controlled with cybex machines. Pt demonstrated good control with lateral walking with sportscord. Pt required close supervision with lateral walking and lateral step ups toensure safety. Pt continues to progress towards all goals appropriatly at this time.     Rehab Potential  Good    Clinical Impairments Affecting Rehab Potential  acute on chronic nature of pain, presence of Rt THA and bilat TKA    PT Frequency  2x / week    PT Duration  6 weeks    PT Treatment/Interventions  Cryotherapy;Electrical Stimulation;Iontophoresis 4mg /ml Dexamethasone;Moist Heat;Ultrasound;Gait training;Therapeutic exercise;Therapeutic activities;Neuromuscular re-education;Passive range of motion;Dry needling    PT Next Visit Plan  Continue progressing TE     Consulted and Agree with Plan of Care  Patient       Patient will benefit from skilled therapeutic intervention in order to improve the following deficits and impairments:  Decreased activity tolerance, Decreased endurance, Decreased range of motion, Decreased strength, Difficulty walking, Increased muscle spasms, Impaired  flexibility, Hypomobility, Obesity, Pain  Visit Diagnosis: Chronic right-sided low back pain without sciatica  Pain in right hip  Difficulty in walking, not elsewhere classified     Problem List There are no active problems to display for this patient.   Howell Rucks, SPTA 01/19/2018, 9:34 AM  [place] [address] [address] [place], [place], [address] Phone: [phone]   Fax:  [phone]  Name: Derrall Hicks MRN: [id number] Date of Birth: 1945-08-04

## 2018-01-23 ENCOUNTER — Encounter: Payer: Self-pay | Admitting: Physical Therapy

## 2018-01-23 ENCOUNTER — Ambulatory Visit: Payer: Medicare Other | Admitting: Physical Therapy

## 2018-01-23 DIAGNOSIS — M545 Low back pain: Secondary | ICD-10-CM | POA: Diagnosis not present

## 2018-01-23 DIAGNOSIS — G8929 Other chronic pain: Secondary | ICD-10-CM | POA: Diagnosis not present

## 2018-01-23 DIAGNOSIS — R262 Difficulty in walking, not elsewhere classified: Secondary | ICD-10-CM

## 2018-01-23 DIAGNOSIS — M25551 Pain in right hip: Secondary | ICD-10-CM | POA: Diagnosis not present

## 2018-01-23 NOTE — Therapy (Signed)
[place], [place], [address] Phone: [phone]   Fax:  [phone]  Physical Therapy Treatment  Patient Details  Name: Carlos Moreno MRN: [id number] Date of Birth: September 18, 1945 Referring Provider (PT): Alvan Dame MD   Encounter Date: 01/23/2018  PT End of Session - 01/23/18 0923    Date for PT Re-Evaluation  02/11/18    PT Start Time  0842    PT Stop Time  0923    PT Time Calculation (min)  41 min    Activity Tolerance  Patient tolerated treatment well    Behavior During Therapy  [facility] for tasks assessed/performed       Past Medical History:  Diagnosis Date  . Anemia   . Basal cell carcinoma   . CAD (coronary artery disease)    Stent 2017.  Stent 2005 (Details of both stents pending)  . Cataract   . Curvature of spine   . Hypertension   . Macular degeneration   . Obesity   . OSA (obstructive sleep apnea)    CPAP    Past Surgical History:  Procedure Laterality Date  . ABDOMINOPLASTY  02/2009  . ANGIOPLASTY  06/05 05/17   with Stents  . CHOLECYSTECTOMY  2001  . COLONOSCOPY  08/07 11/12 04/16   . GASTRIC BYPASS  10/07/2006  . HERNIA REPAIR Left 1956  . KNEE ARTHROSCOPY Right 04/07 12/14 05/18    x3  . LAMINECTOMY     Lumbar  . REPLACEMENT TOTAL KNEE Left 1994  . TOTAL HIP ARTHROPLASTY  01/2007    There were no vitals filed for this visit.  Subjective Assessment - 01/23/18 0842    Subjective  "Pretty good, all things being considered"      Pain Score  4     Pain Location  Hip    Pain Orientation  Right                       [facility] Adult PT Treatment/Exercise - 01/23/18 0001      High Level Balance   High Level Balance Comments  SLS 15'' x3 each lewg      Knee/Hip Exercises: Aerobic   Nustep  L4 x 7 min LE only       Knee/Hip Exercises: Machines for Strengthening   Cybex Knee Extension  RLE # x1     Cybex Knee Flexion  55# 3x10    Cybex Leg Press  60#  3x10, RLE 20lb 2x10       Knee/Hip Exercises: Standing   Lateral Step Up  10 reps;Step Height: 4";Both;1 set    Other Standing Knee Exercises  resisted side step 30lb x4 each    Other Standing Knee Exercises  10lb hip ext and abd X10 eaxh, 5ln R hip abd      Iontophoresis   Type of Iontophoresis  Dexamethasone    Location  R hip    Dose  54m    Time  4 hour patch                   PT Long Term Goals - 01/23/18 0924      PT LONG TERM GOAL #2   Title  Pt will [facility]t hip [facility]  6 weeks 02/11/17    Status  On-going      PT LONG TERM GOAL #3   Title  Pt will improve Rt hip strength to at least  4+/5 all planes.  6 weeks 02/11/17    Status  On-going      PT LONG TERM GOAL #4   Title  Pt will report overall less than 3/10 pain with usual activity.  6 weeks 02/11/17    Status  Partially Met            Plan - 01/23/18 0925    Clinical Impression Statement  Pt continues to give good effort during therapy. Report some soreness and pain from last session but insisted that we did back off any interventions. Pt did display increase R hip strength with lateral step up compared to previous treatment with less compensation. R HS cramping reported after leg press. Some L hip weakness noted with 5lb abduction.    Rehab Potential  Good    PT Frequency  2x / week    PT Duration  6 weeks    PT Treatment/Interventions  Cryotherapy;Electrical Stimulation;Iontophoresis 80m/ml Dexamethasone;Moist Heat;Ultrasound;Gait training;Therapeutic exercise;Therapeutic activities;Neuromuscular re-education;Passive range of motion;Dry needling    PT Next Visit Plan  Continue progressing TE        Patient will benefit from skilled therapeutic intervention in order to improve the following deficits and impairments:  Decreased activity tolerance, Decreased endurance, Decreased range of motion, Decreased strength, Difficulty walking, Increased muscle spasms, Impaired flexibility, Hypomobility,  Obesity, Pain  Visit Diagnosis: Chronic right-sided low back pain without sciatica  Difficulty in walking, not elsewhere classified  Pain in right hip     Problem List There are no active problems to display for this patient.   RScot Jun PTA 01/23/2018, 9:27 AM  [place] [address]Phone: [phone]  Fax:  [phone] Name: AKenston LongtonMRN: [id number] of Birth: 205-14-1947

## 2018-01-26 ENCOUNTER — Ambulatory Visit: Payer: Medicare Other | Admitting: Physical Therapy

## 2018-01-26 ENCOUNTER — Encounter: Payer: Self-pay | Admitting: Physical Therapy

## 2018-01-26 DIAGNOSIS — G8929 Other chronic pain: Secondary | ICD-10-CM

## 2018-01-26 DIAGNOSIS — R262 Difficulty in walking, not elsewhere classified: Secondary | ICD-10-CM | POA: Diagnosis not present

## 2018-01-26 DIAGNOSIS — M545 Low back pain: Secondary | ICD-10-CM | POA: Diagnosis not present

## 2018-01-26 DIAGNOSIS — M25551 Pain in right hip: Secondary | ICD-10-CM

## 2018-01-26 DIAGNOSIS — Z125 Encounter for screening for malignant neoplasm of prostate: Secondary | ICD-10-CM | POA: Diagnosis not present

## 2018-01-26 DIAGNOSIS — E7849 Other hyperlipidemia: Secondary | ICD-10-CM | POA: Diagnosis not present

## 2018-01-26 DIAGNOSIS — R82998 Other abnormal findings in urine: Secondary | ICD-10-CM | POA: Diagnosis not present

## 2018-01-26 DIAGNOSIS — E1129 Type 2 diabetes mellitus with other diabetic kidney complication: Secondary | ICD-10-CM | POA: Diagnosis not present

## 2018-01-26 DIAGNOSIS — N183 Chronic kidney disease, stage 3 (moderate): Secondary | ICD-10-CM | POA: Diagnosis not present

## 2018-01-26 NOTE — Therapy (Signed)
[place] [address] [place] [place], [place], [address] Phone: [phone]   Fax:  [phone]  Physical Therapy Treatment  Patient Details  Name: Carlos Moreno MRN: [id number] Date of Birth: 1945/12/19 Referring Provider (PT): Alvan Dame MD   Encounter Date: 01/26/2018  PT End of Session - 01/26/18 0926    Visit Number  6    Number of Visits  12    Date for PT Re-Evaluation  02/11/18    Authorization Type  MCR/AARP    PT Start Time  0842    PT Stop Time  0924    PT Time Calculation (min)  42 min    Activity Tolerance  Patient tolerated treatment well    Behavior During Therapy  [facility] for tasks assessed/performed       Past Medical History:  Diagnosis Date  . Anemia   . Basal cell carcinoma   . CAD (coronary artery disease)    Stent 2017.  Stent 2005 (Details of both stents pending)  . Cataract   . Curvature of spine   . Hypertension   . Macular degeneration   . Obesity   . OSA (obstructive sleep apnea)    CPAP    Past Surgical History:  Procedure Laterality Date  . ABDOMINOPLASTY  02/2009  . ANGIOPLASTY  06/05 05/17   with Stents  . CHOLECYSTECTOMY  2001  . COLONOSCOPY  08/07 11/12 04/16  . GASTRIC BYPASS  10/07/2006  . HERNIA REPAIR Left 1956  . KNEE ARTHROSCOPY Right 04/07 12/14 05/18   x3  . LAMINECTOMY     Lumbar  . REPLACEMENT TOTAL KNEE Left 1994  . TOTAL HIP ARTHROPLASTY  01/2007    There were no vitals filed for this visit.  Subjective Assessment - 01/26/18 0851    Subjective  Some discomfort today. Fair amount of discomfort  yesterday but didn't know why.     Currently in Pain?  Yes    Pain Location  Hip    Pain Orientation  Right                       [facility] Adult PT Treatment/Exercise - 01/26/18 0001      Knee/Hip Exercises: Aerobic   Elliptical  I3 R 3 x2 min    Nustep  L4 x 7 min LE only       Knee/Hip Exercises: Machines for Strengthening   Cybex Knee Extension   RLE 20lb 2x10     Cybex Knee Flexion  RLE 25lb 2x15     Cybex Leg Press  50# 2x15, RLE 30lb x10 x5       Knee/Hip Exercises: Standing   Forward Step Up  Both;10 reps;Step Height: 6"      Knee/Hip Exercises: Supine   Bridges  Both;20 reps;Strengthening    Straight Leg Raises  Strengthening;Right;1 set;15 reps    Straight Leg Raise with External Rotation  Right;1 set;10 reps    Other Supine Knee/Hip Exercises  RLE hip abd x5      Iontophoresis   Type of Iontophoresis  Dexamethasone    Location  R hip    Dose  65m    Time  4 hour patch                   PT Long Term Goals - 01/23/18 0924      PT LONG TERM GOAL #2   Title  Pt will increase Rt hip  ROM to [facility].  6 weeks 02/11/17    Status  On-going      PT LONG TERM GOAL #3   Title  Pt will improve Rt hip strength to at least 4+/5 all planes.  6 weeks 02/11/17    Status  On-going      PT LONG TERM GOAL #4   Title  Pt will report overall less than 3/10 pain with usual activity.  6 weeks 02/11/17    Status  Partially Met            Plan - 01/26/18 4431    Clinical Impression Statement  Pt did well overall, Difficulty with forward step up on 6 inch box so had to move down to the 4 inch. Muscle fatigue noted with seated HS curls and extensions. Fatigue and weakness noted with all supine exercises.    Rehab Potential  Good    PT Frequency  2x / week    PT Duration  6 weeks    PT Treatment/Interventions  Cryotherapy;Electrical Stimulation;Iontophoresis 66m/ml Dexamethasone;Moist Heat;Ultrasound;Gait training;Therapeutic exercise;Therapeutic activities;Neuromuscular re-education;Passive range of motion;Dry needling    PT Next Visit Plan  Continue progressing TE, Supine interventions to isolate R hip. Last ionto        Patient will benefit from skilled therapeutic intervention in order to improve the following deficits and impairments:  Decreased activity tolerance, Decreased endurance, Decreased range of motion, Decreased  strength, Difficulty walking, Increased muscle spasms, Impaired flexibility, Hypomobility, Obesity, Pain  Visit Diagnosis: Chronic right-sided low back pain without sciatica  Difficulty in walking, not elsewhere classified  Pain in right hip     Problem List There are no active problems to display for this patient.   RScot Jun PTA 01/26/2018, 9:30 AM  [address] [place] [address]Phone: [phone]  Fax:  [phone] Name: Carlos EwartMRN: [id number] of Birth: 227-Mar-1947

## 2018-01-28 DIAGNOSIS — M25551 Pain in right hip: Secondary | ICD-10-CM | POA: Diagnosis not present

## 2018-01-28 DIAGNOSIS — M5136 Other intervertebral disc degeneration, lumbar region: Secondary | ICD-10-CM | POA: Diagnosis not present

## 2018-01-28 DIAGNOSIS — Z471 Aftercare following joint replacement surgery: Secondary | ICD-10-CM | POA: Diagnosis not present

## 2018-01-28 DIAGNOSIS — M7061 Trochanteric bursitis, right hip: Secondary | ICD-10-CM | POA: Diagnosis not present

## 2018-01-28 DIAGNOSIS — M545 Low back pain, unspecified: Secondary | ICD-10-CM | POA: Insufficient documentation

## 2018-01-28 DIAGNOSIS — Z96653 Presence of artificial knee joint, bilateral: Secondary | ICD-10-CM | POA: Insufficient documentation

## 2018-01-28 DIAGNOSIS — M4180 Other forms of scoliosis, site unspecified: Secondary | ICD-10-CM | POA: Diagnosis not present

## 2018-01-29 DIAGNOSIS — Z6841 Body Mass Index (BMI) 40.0 and over, adult: Secondary | ICD-10-CM | POA: Diagnosis not present

## 2018-01-29 DIAGNOSIS — Z0000 Encounter for general adult medical examination without abnormal findings: Secondary | ICD-10-CM | POA: Diagnosis not present

## 2018-01-29 DIAGNOSIS — N183 Chronic kidney disease, stage 3 (moderate): Secondary | ICD-10-CM | POA: Diagnosis not present

## 2018-01-29 DIAGNOSIS — E1129 Type 2 diabetes mellitus with other diabetic kidney complication: Secondary | ICD-10-CM | POA: Diagnosis not present

## 2018-01-29 DIAGNOSIS — D7589 Other specified diseases of blood and blood-forming organs: Secondary | ICD-10-CM | POA: Diagnosis not present

## 2018-01-29 DIAGNOSIS — E7849 Other hyperlipidemia: Secondary | ICD-10-CM | POA: Diagnosis not present

## 2018-01-29 DIAGNOSIS — R808 Other proteinuria: Secondary | ICD-10-CM | POA: Diagnosis not present

## 2018-01-29 DIAGNOSIS — Z1389 Encounter for screening for other disorder: Secondary | ICD-10-CM | POA: Diagnosis not present

## 2018-01-29 DIAGNOSIS — I10 Essential (primary) hypertension: Secondary | ICD-10-CM | POA: Diagnosis not present

## 2018-01-29 DIAGNOSIS — Z934 Other artificial openings of gastrointestinal tract status: Secondary | ICD-10-CM | POA: Diagnosis not present

## 2018-01-29 DIAGNOSIS — G4733 Obstructive sleep apnea (adult) (pediatric): Secondary | ICD-10-CM | POA: Diagnosis not present

## 2018-01-29 DIAGNOSIS — R011 Cardiac murmur, unspecified: Secondary | ICD-10-CM | POA: Diagnosis not present

## 2018-01-29 DIAGNOSIS — M25551 Pain in right hip: Secondary | ICD-10-CM | POA: Diagnosis not present

## 2018-01-30 DIAGNOSIS — Z1212 Encounter for screening for malignant neoplasm of rectum: Secondary | ICD-10-CM | POA: Diagnosis not present

## 2018-04-30 DIAGNOSIS — Z20818 Contact with and (suspected) exposure to other bacterial communicable diseases: Secondary | ICD-10-CM | POA: Diagnosis not present

## 2018-06-08 ENCOUNTER — Telehealth: Payer: Self-pay | Admitting: Cardiology

## 2018-06-08 NOTE — Progress Notes (Signed)
Virtual Visit via Video Note   This visit type was conducted due to national recommendations for restrictions regarding the COVID-19 Pandemic (e.g. social distancing) in an effort to limit this patient's exposure and mitigate transmission in our community.  Due to his co-morbid illnesses, this patient is at least at moderate risk for complications without adequate follow up.  This format is felt to be most appropriate for this patient at this time.  All issues noted in this document were discussed and addressed.  A limited physical exam was performed with this format.  Please refer to the patient's chart for his consent to telehealth for [facility].   Evaluation Performed:  Follow-up visit  Date:  06/09/2018   ID:  Carlos Moreno, DOB Jun 21, 1945, MRN [id number]  Patient Location: Home Provider Location: Home  PCP:  Crist Infante, MD  Cardiologist:  Minus Breeding, MD  Electrophysiologist:  None   Chief Complaint:  CAD  History of Present Illness:    Carlos Moreno is a 73 y.o. male who presents for follow up of CAD.  He had distant stenting in 2005.  He also had more recent stenting in 2007.  He thinks this was to his LAD.   I do not have the specific records.  Prior to the last visit he was evaluated in [place] secondary to anemia and he was taken off of ASA for presumed ulceration at a previous GI surgical scar.   He was subsequently restarted on ASA.    He is being managed actively because his A1c has gone up.  Interestingly in March she had cough and felt very poorly.  He actually got COVID testing and was found to be -13 days later.  His blood pressures been up a little bit.  He has had losartan increased.  He had Jardiance started and Tradjenta.  The Jardiance was recently increased.  He denies any active cardiovascular symptoms.  Is not had any chest pressure, neck or arm discomfort.  He has no new shortness of breath, PND or orthopnea.  The patient does not have symptoms  concerning for COVID-19 infection (fever, chills, cough, or new shortness of breath).    Past Medical History:  Diagnosis Date  . Anemia   . Basal cell carcinoma   . CAD (coronary artery disease)    Stent 2017.  Stent 2005 (Details of both stents pending)  . Cataract   . Curvature of spine   . Hypertension   . Macular degeneration   . Obesity   . OSA (obstructive sleep apnea)    CPAP   Past Surgical History:  Procedure Laterality Date  . ABDOMINOPLASTY  02/2009  . ANGIOPLASTY  06/05 05/17   with Stents  . CHOLECYSTECTOMY  2001  . COLONOSCOPY  08/07 11/12 04/16   . GASTRIC BYPASS  10/07/2006  . HERNIA REPAIR Left 1956  . KNEE ARTHROSCOPY Right 04/07 12/14 05/18    x3  . LAMINECTOMY     Lumbar  . REPLACEMENT TOTAL KNEE Left 1994  . TOTAL HIP ARTHROPLASTY  01/2007     Current Meds  Medication Sig  . aspirin EC 81 MG tablet Take 81 mg by mouth daily.  Marland Kitchen atorvastatin (LIPITOR) 40 MG tablet Take 40 mg by mouth daily.  Marland Kitchen b complex vitamins capsule Take 1 capsule by mouth as directed.  Marland Kitchen BYSTOLIC 5 MG tablet Take 5 mg by mouth daily.  . calcium gluconate 500 MG tablet Take 1 tablet by mouth daily.  . cetirizine (ZYRTEC) 10  MG tablet Take 10 mg by mouth daily.  . Cholecalciferol (VITAMIN D3) 5000 units CAPS Take 1 capsule by mouth daily.  . Cyanocobalamin (B-12) 5000 MCG SUBL Place 1 tablet under the tongue as directed.  . Diphenhydramine-APAP, sleep, (TYLENOL PM EXTRA STRENGTH PO) Take 1 tablet by mouth daily.  . isosorbide mononitrate (IMDUR) 30 MG 24 hr tablet Take 30 mg by mouth daily.  Marland Kitchen JARDIANCE 25 MG TABS tablet Take 1 tablet by mouth daily.  Marland Kitchen losartan (COZAAR) 100 MG tablet Take 100 mg by mouth daily.  . Multiple Vitamin (MULTIVITAMIN) tablet Take 1 tablet by mouth daily.  . Multiple Vitamins-Minerals (PRESERVISION AREDS 2 PO) Take 1 tablet by mouth daily.  . nitroGLYCERIN (NITROSTAT) 0.4 MG SL tablet Place 1 tablet (0.4 mg total) under the tongue every 5 (five)  minutes as needed for chest pain.  . pantoprazole (PROTONIX) 40 MG tablet Take 1 tablet by mouth daily.  . Polysaccharide Iron Complex (IRON UP PO) Take 120 mg by mouth daily.  . TRADJENTA 5 MG TABS tablet Take 1 tablet by mouth daily.  . traMADol (ULTRAM) 50 MG tablet Take 50 mg by mouth every 6 (six) hours as needed.  . vitamin C (ASCORBIC ACID) 500 MG tablet Take 500 mg by mouth daily.  Marland Kitchen zinc gluconate 50 MG tablet Take 50 mg by mouth daily.     Allergies:   Patient has no known allergies.   Social History   Tobacco Use  . Smoking status: Never Smoker  . Smokeless tobacco: Never Used  Substance Use Topics  . Alcohol use: Never    Frequency: Never  . Drug use: Never     Family Hx: The patient's family history includes Colon cancer in his mother; Diabetes in his paternal grandmother; Rheum arthritis in his father; Tuberculosis in his paternal grandfather.  ROS:   Please see the history of present illness.    As stated in the HPI and negative for all other systems.    Prior CV studies:   The following studies were reviewed today:    Labs/Other Tests and Data Reviewed:    EKG:  No EKG  Recent Labs: No results found for requested labs within last 8760 hours.   Recent Lipid Panel No results found for: CHOL, TRIG, HDL, CHOLHDL, LDLCALC, LDLDIRECT  Wt Readings from Last 3 Encounters:  06/09/18 260 lb (117.9 kg)  06/26/17 263 lb (119.3 kg)  01/10/17 252 lb 6.4 oz (114.5 kg)     Objective:    Vital Signs:  BP (!) 143/61   Pulse (!) 53   Ht 5\' 10"  (1.778 m)   Wt 260 lb (117.9 kg)   BMI 37.31 kg/m    VITAL SIGNS:  reviewed GEN:  no acute distress RESPIRATORY:  normal respiratory effort, symmetric expansion NEURO:  alert and oriented x 3, no obvious focal deficit PSYCH:  normal affect  ASSESSMENT & PLAN:    CAD:   The patient has no new sypmtoms.  No further cardiovascular testing is indicated.  We will continue with aggressive risk reduction and meds as  listed.  HTN:  His BP is mildly elevated.  However, he recently had his meds adjusted.  No change in therapy is planned.  He will continue with meds as listed.   OBESITY:      He continues to work on this.   DYSLIPIDEMIA:    I don't have the recent lipids but he reports that they were excellent.  Per Crist Infante,  MD    COVID-19 Education: The signs and symptoms of COVID-19 were discussed with the patient and how to seek care for testing (follow up with PCP or arrange E-visit).  The importance of social distancing was discussed today.  Time:   Today, I have spent 16 minutes with the patient with telehealth technology discussing the above problems.     Medication Adjustments/Labs and Tests Ordered: Current medicines are reviewed at length with the patient today.  Concerns regarding medicines are outlined above.   Tests Ordered: No orders of the defined types were placed in this encounter.   Medication Changes: No orders of the defined types were placed in this encounter.   Disposition:  Follow up one year  Signed, Minus Breeding, MD  06/09/2018 12:52 PM    [place]

## 2018-06-09 ENCOUNTER — Encounter: Payer: Self-pay | Admitting: Cardiology

## 2018-06-09 ENCOUNTER — Telehealth (INDEPENDENT_AMBULATORY_CARE_PROVIDER_SITE_OTHER): Payer: Medicare Other | Admitting: Cardiology

## 2018-06-09 VITALS — BP 143/61 | HR 53 | Ht 70.0 in | Wt 260.0 lb

## 2018-06-09 DIAGNOSIS — Z7189 Other specified counseling: Secondary | ICD-10-CM | POA: Insufficient documentation

## 2018-06-09 DIAGNOSIS — I10 Essential (primary) hypertension: Secondary | ICD-10-CM | POA: Insufficient documentation

## 2018-06-09 DIAGNOSIS — I2510 Atherosclerotic heart disease of native coronary artery without angina pectoris: Secondary | ICD-10-CM

## 2018-06-09 DIAGNOSIS — E785 Hyperlipidemia, unspecified: Secondary | ICD-10-CM

## 2018-06-09 NOTE — Patient Instructions (Signed)

## 2018-08-10 NOTE — Telephone Encounter (Signed)
Opened in error

## 2018-09-01 ENCOUNTER — Other Ambulatory Visit: Payer: Self-pay | Admitting: Internal Medicine

## 2018-09-01 ENCOUNTER — Ambulatory Visit
Admission: RE | Admit: 2018-09-01 | Discharge: 2018-09-01 | Disposition: A | Discharge status: Home / Self Care | Payer: Medicare Other | Source: Ambulatory Visit | Attending: Internal Medicine | Admitting: Internal Medicine

## 2018-09-01 DIAGNOSIS — M549 Dorsalgia, unspecified: Secondary | ICD-10-CM

## 2018-09-01 IMAGING — CR THORACIC SPINE - 3 VIEWS
3 series · 3 of 3 positions shown · non-contrast
Comparison: None.

CLINICAL DATA: Thoracic spine pain for 1 week.  No known injury.

EXAM:
THORACIC SPINE - 3 VIEWS

[t thoracic spine ap]
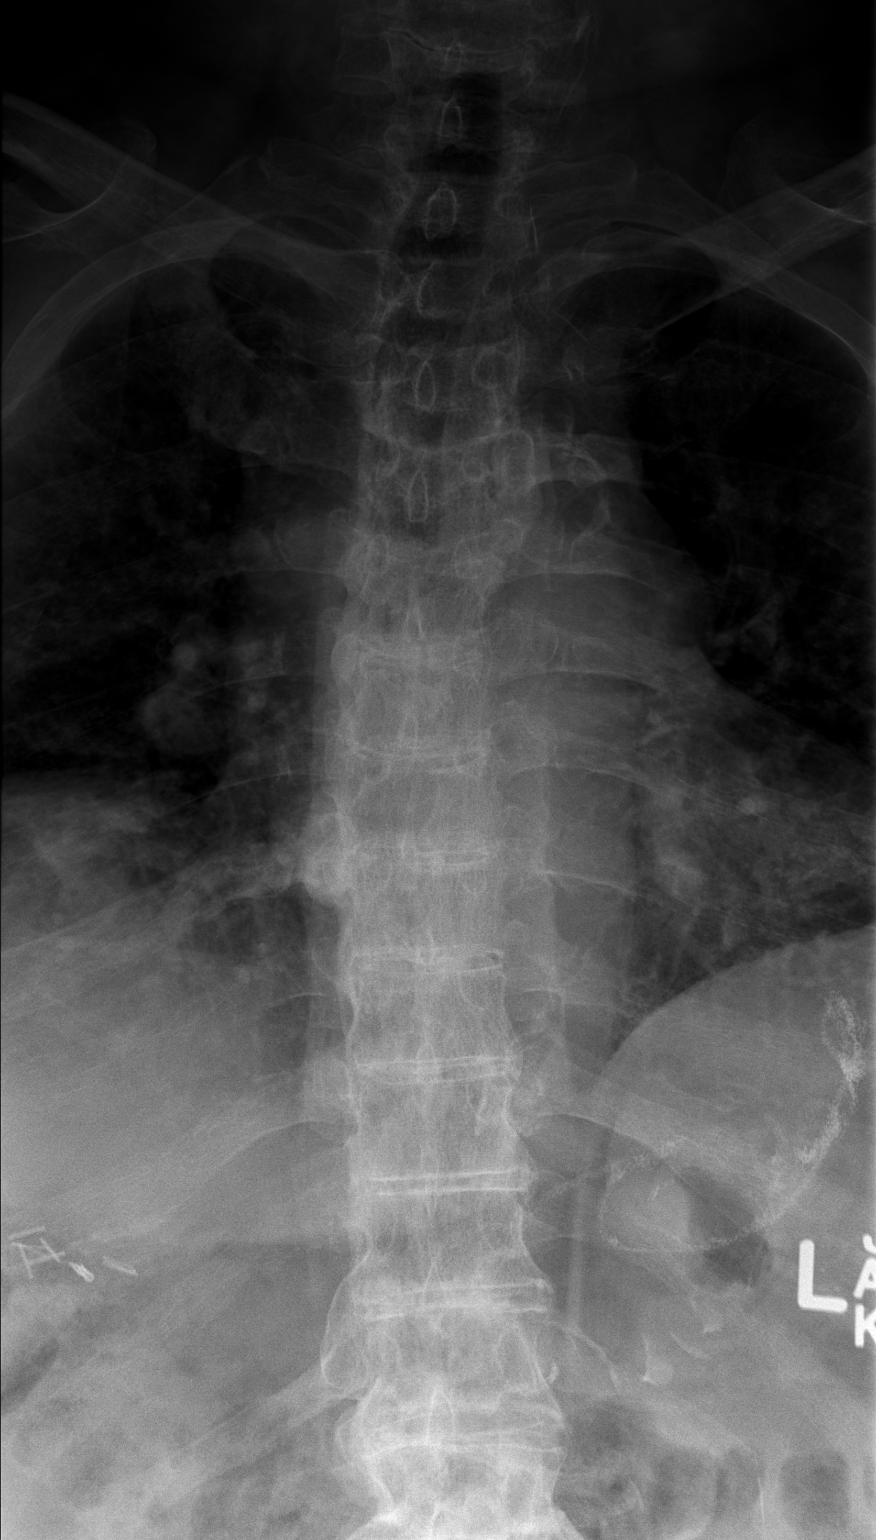

[t thoracic swimmers]
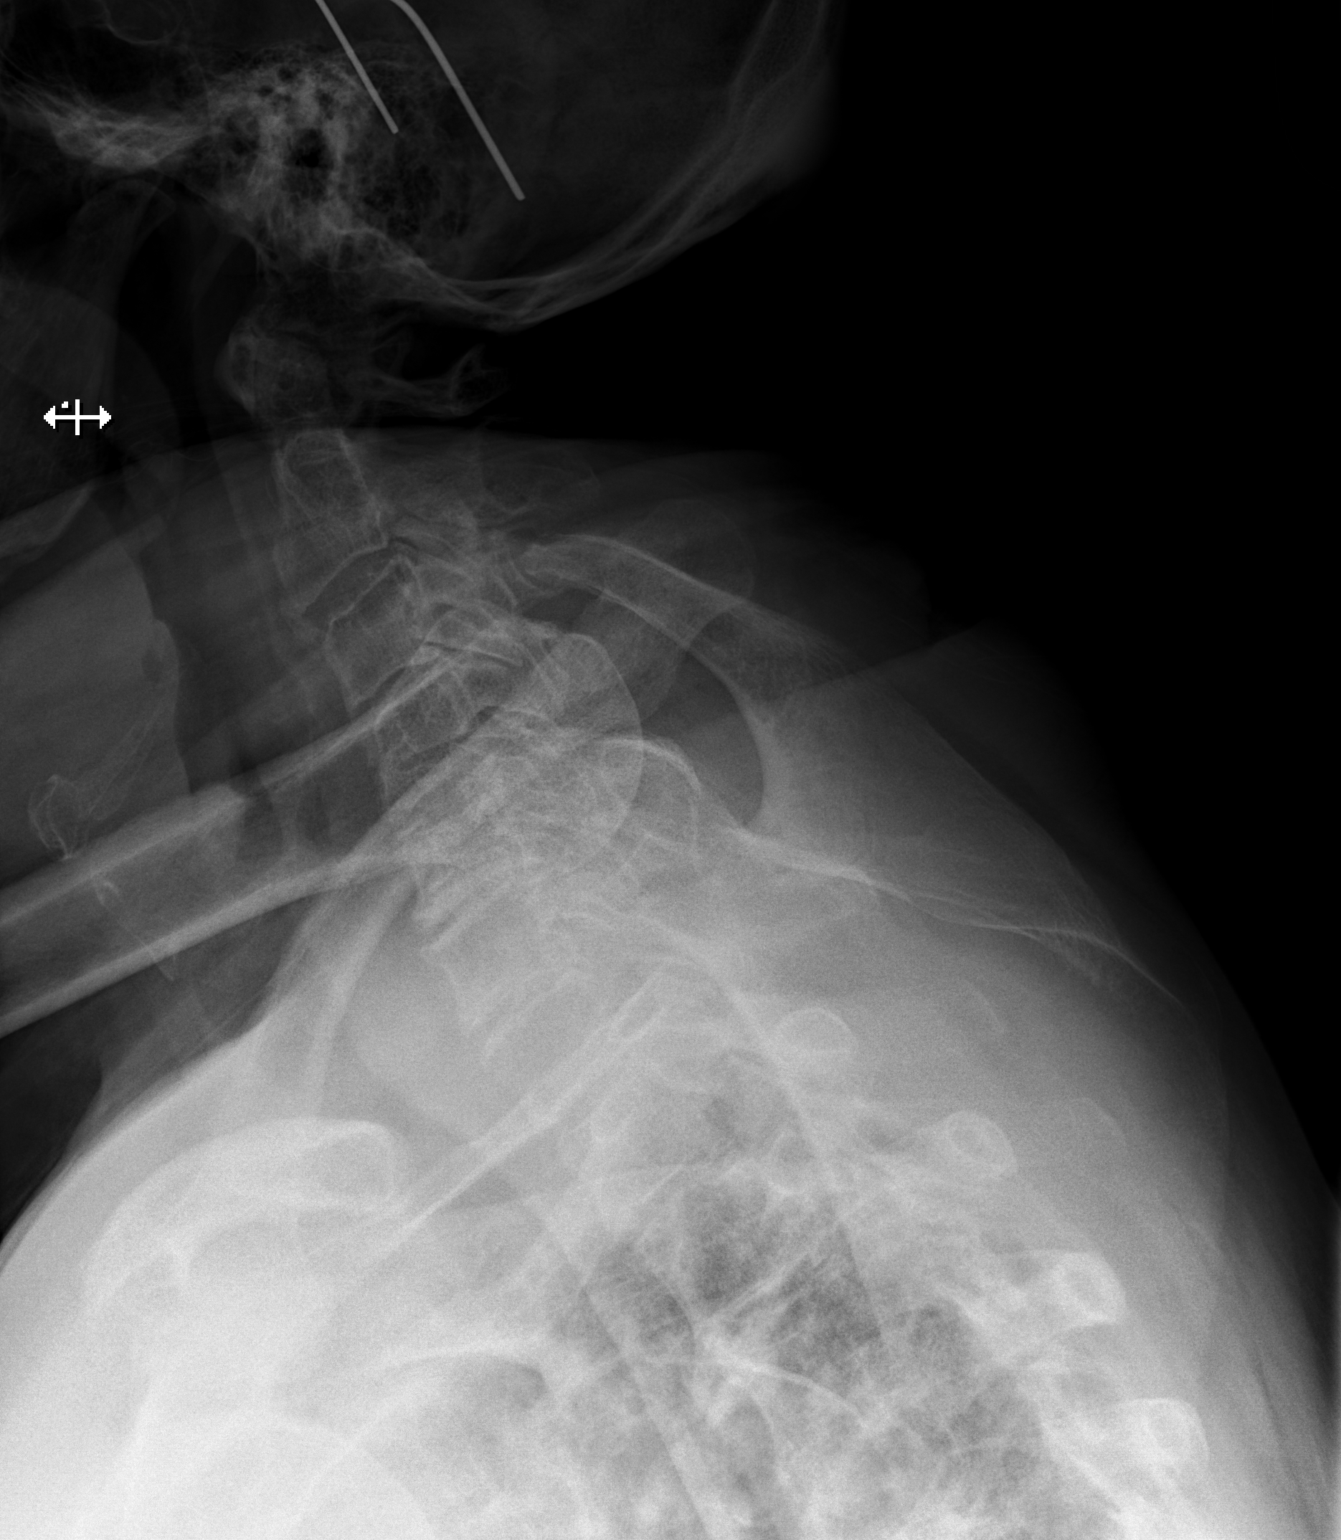

[t thoracic breathing lat]
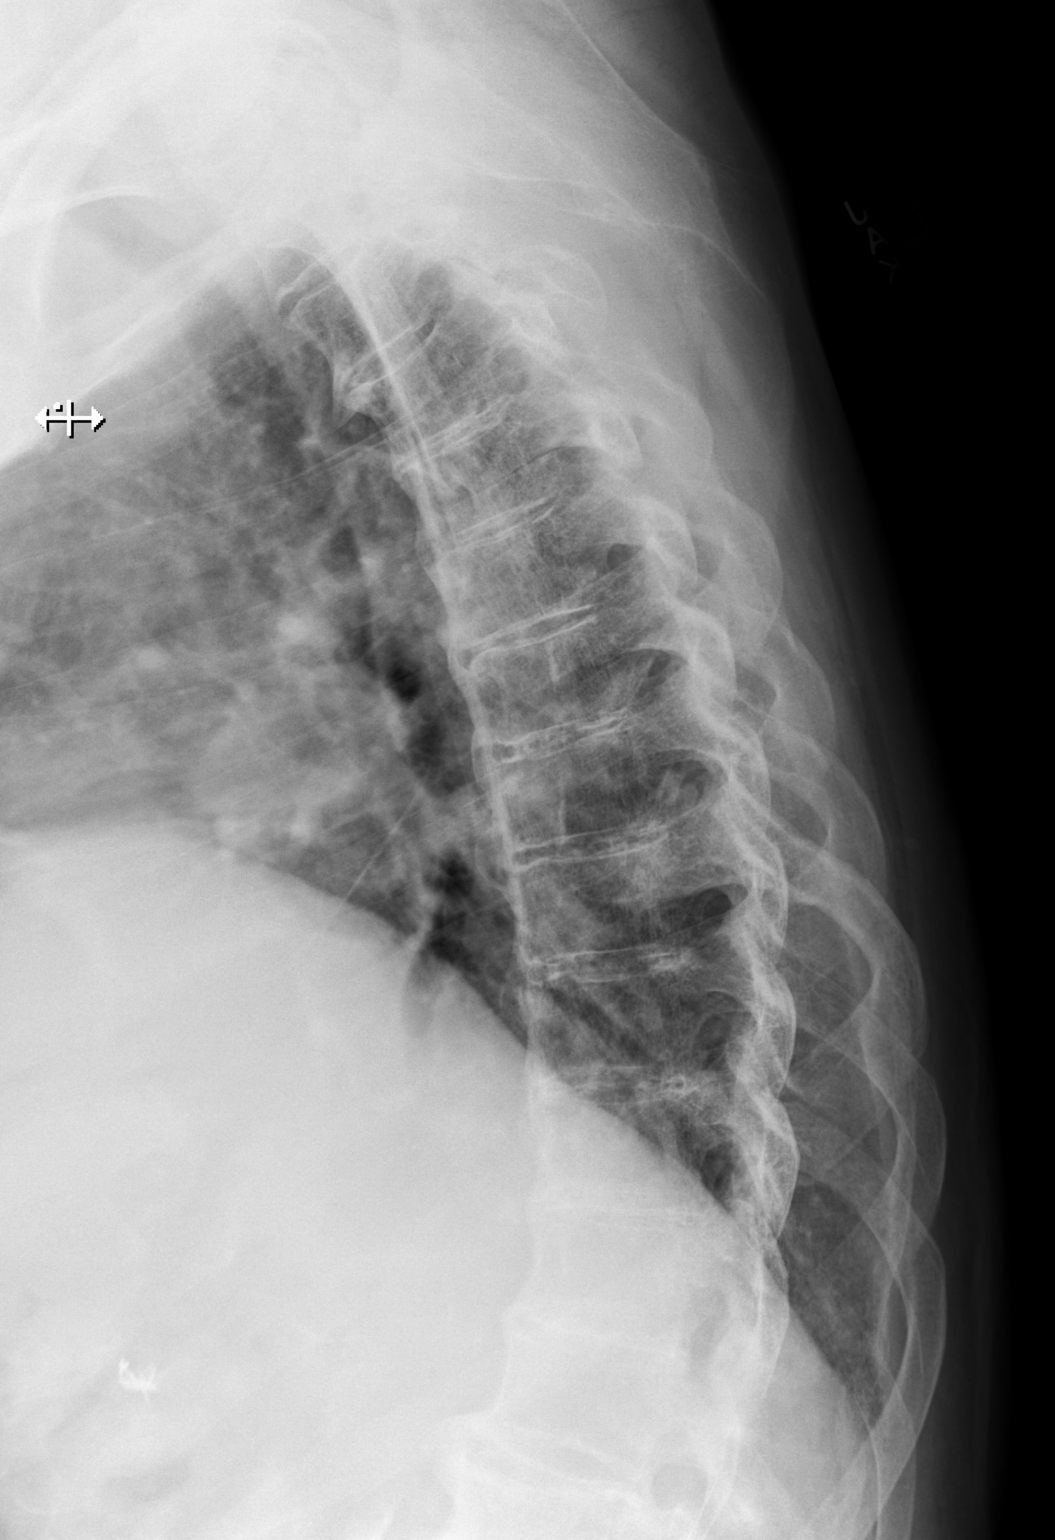

[3 of 3 positions shown; findings below may reference images not displayed]

FINDINGS: No fracture or malalignment. Ossification of the anterior
longitudinal ligament throughout consistent with DISH is noted.
Intervertebral disc space height is maintained. Paraspinous
structures demonstrate no acute abnormality. Surgical clips right
upper quadrant and suture material in the left upper quadrant noted.
IMPRESSION: No acute finding.

DISH.

## 2018-09-01 IMAGING — CR LUMBAR SPINE - COMPLETE 4+ VIEW
5 series · 5 of 5 positions shown · non-contrast
Comparison: None.

CLINICAL DATA: Low back pain for 1 week.  No known injury.

EXAM:
LUMBAR SPINE - COMPLETE 4+ VIEW

[t lumbar spine ap]
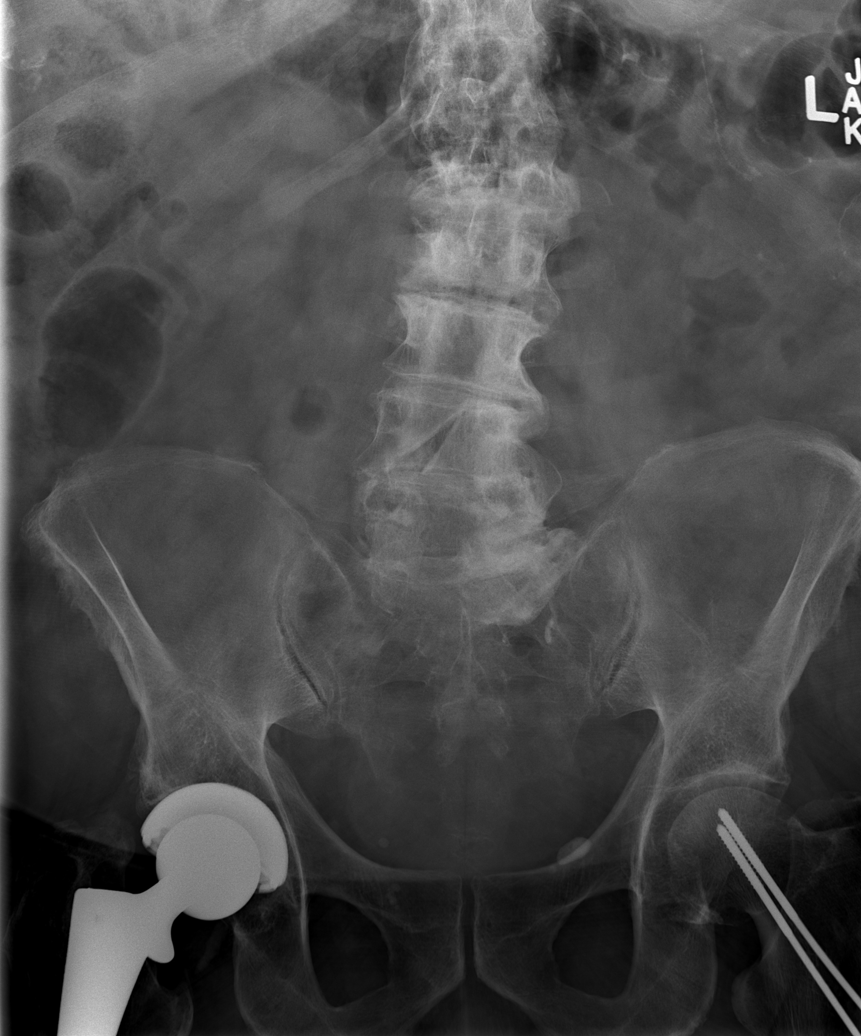

[t lumbar spine obl (1 of 2)]
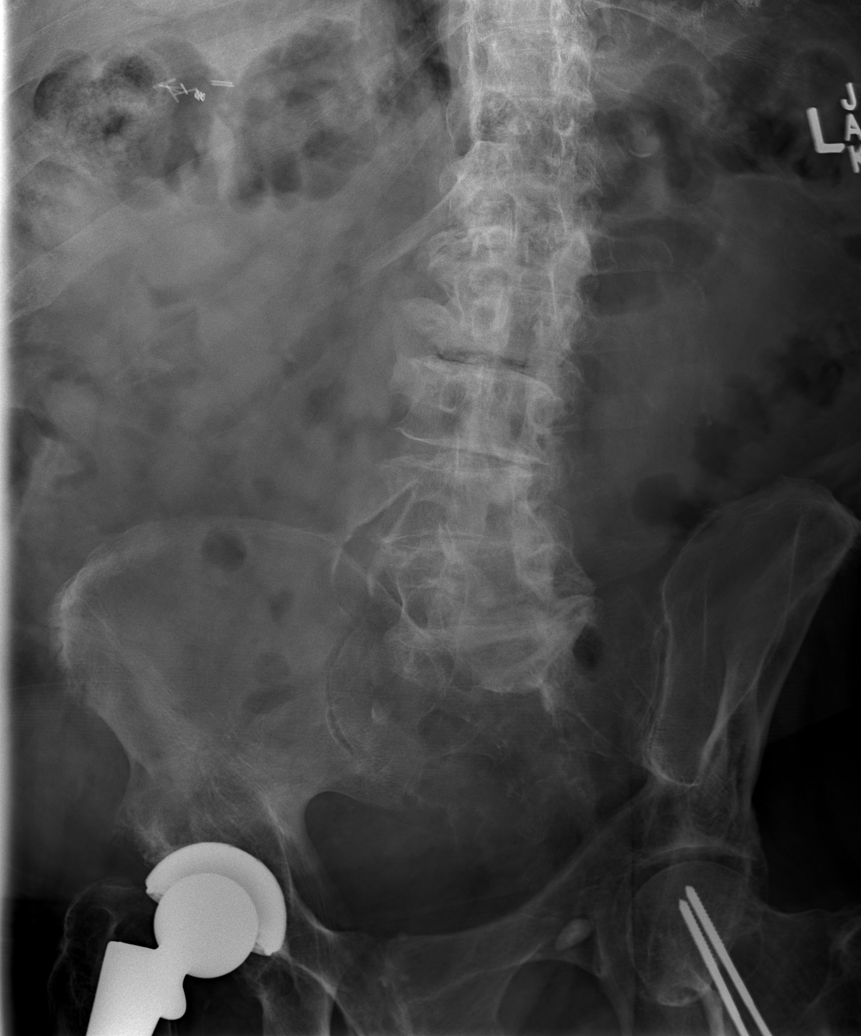

[t lumbar spine obl (2 of 2)]
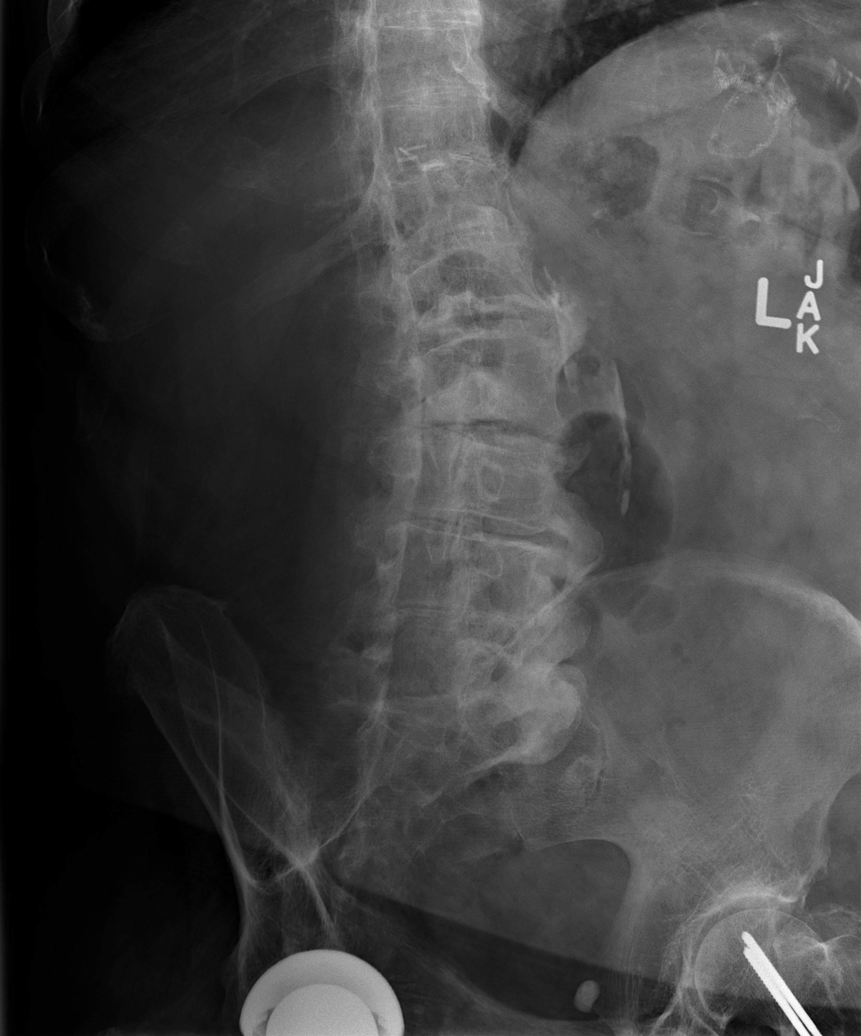

[t lumbar spine lat]
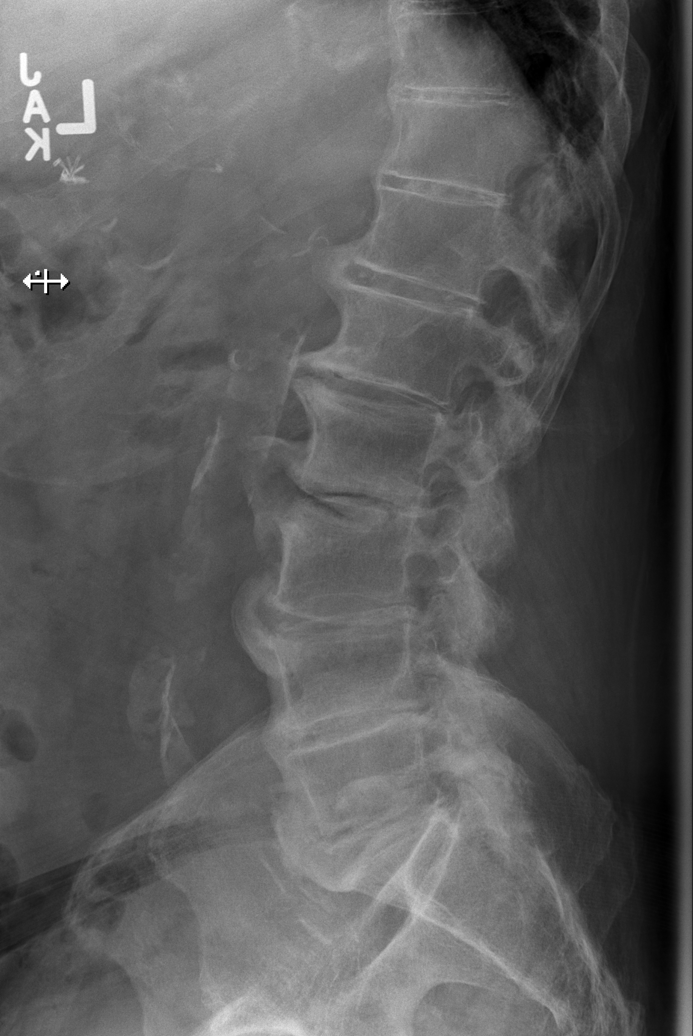

[t lumbar l-5 s-1 spot]
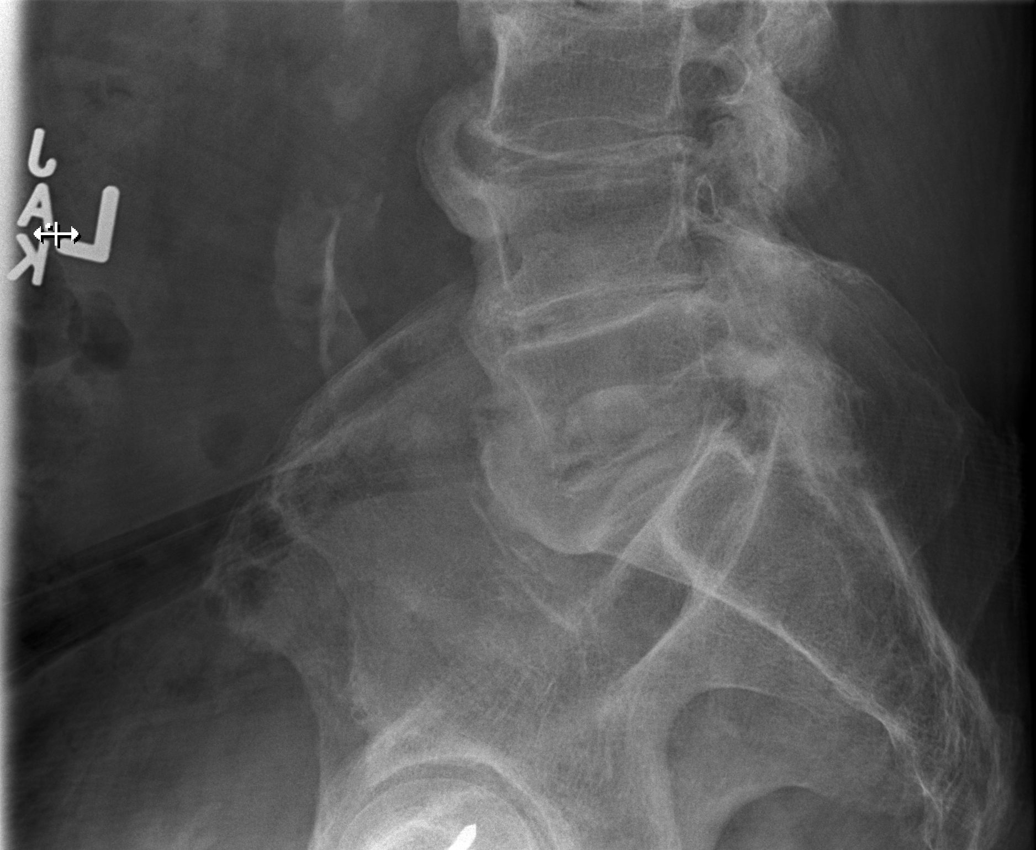

[5 of 5 positions shown; findings below may reference images not displayed]

FINDINGS: No fracture. Convex right scoliosis with the apex at L4-5 noted. No
listhesis. Severe multilevel loss of disc space height and facet
arthropathy are seen. Bulky ossification of the anterior
longitudinal ligament throughout. The patient is status post L2-5
laminectomy. Paraspinous structures demonstrate a right hip
replacement. Status post fixation of a left hip fracture.
Atherosclerosis also noted.
IMPRESSION: No acute finding.

Severe multilevel degenerative disease.

DISH.

Status post L2-5 laminectomy.

Atherosclerosis.

## 2018-12-04 ENCOUNTER — Other Ambulatory Visit: Payer: Self-pay | Admitting: Internal Medicine

## 2018-12-07 ENCOUNTER — Ambulatory Visit
Admission: RE | Admit: 2018-12-07 | Discharge: 2018-12-07 | Disposition: A | Discharge status: Home / Self Care | Payer: Medicare Other | Source: Ambulatory Visit | Attending: Internal Medicine | Admitting: Internal Medicine

## 2018-12-07 IMAGING — US US ABDOMEN LIMITED
1 series · 14 of 25 positions shown · non-contrast
Comparison: None.

CLINICAL DATA: Hyperbilirubinemia

EXAM:
ULTRASOUND ABDOMEN LIMITED RIGHT UPPER QUADRANT

[Series 1: us abdomen limited · 0.33mm/px · 14 of 32 slices shown]
[im 1/32]
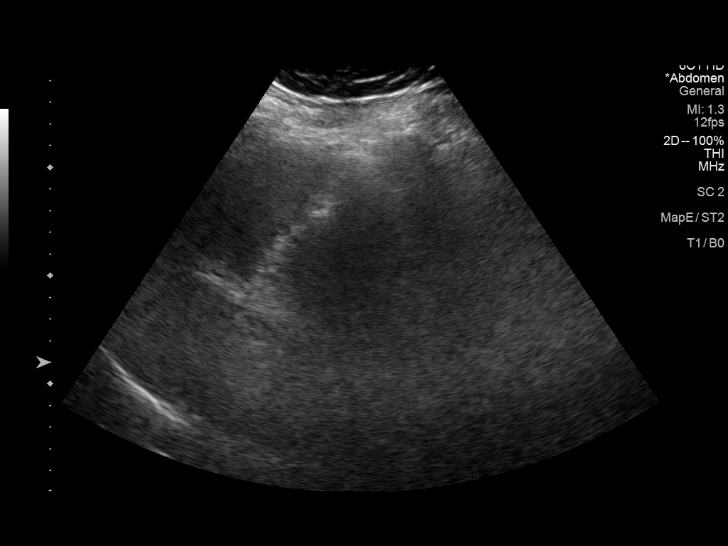
[im 3/32]
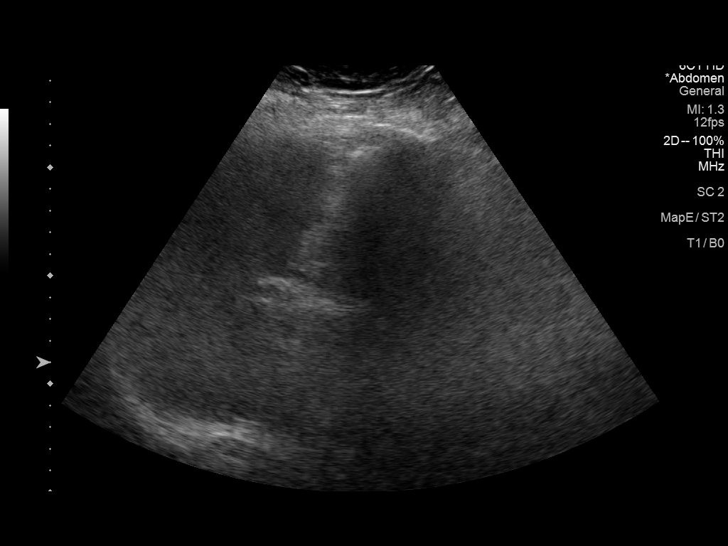
[im 6/32]
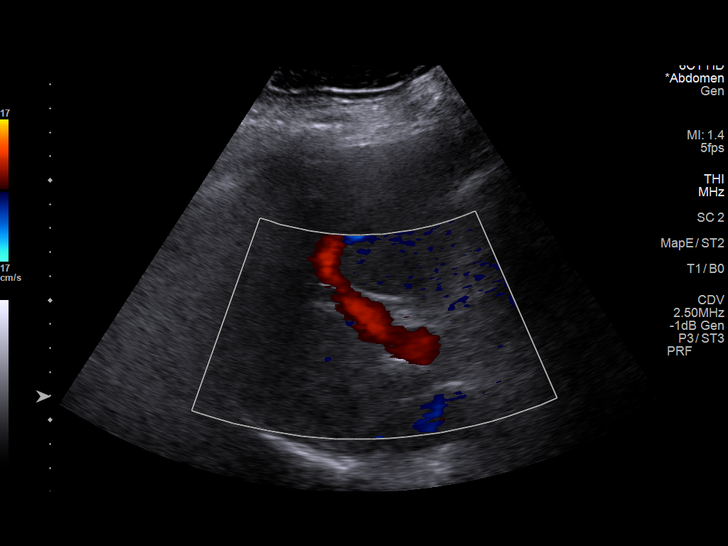
[im 8/32]
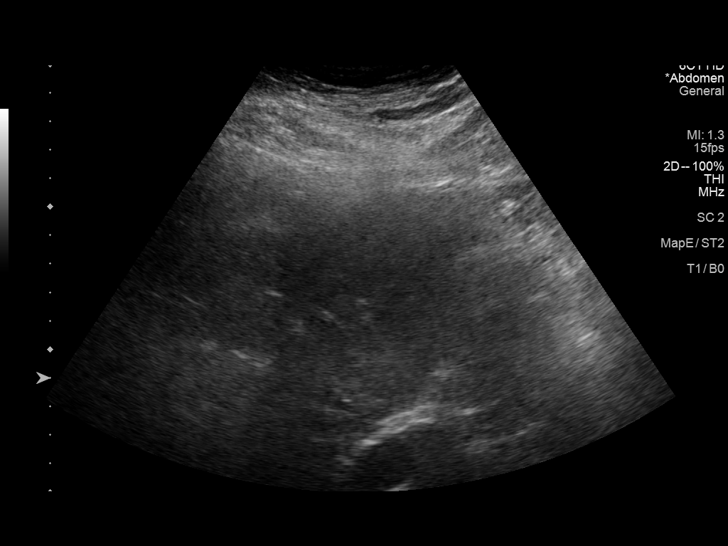
[im 11/32]
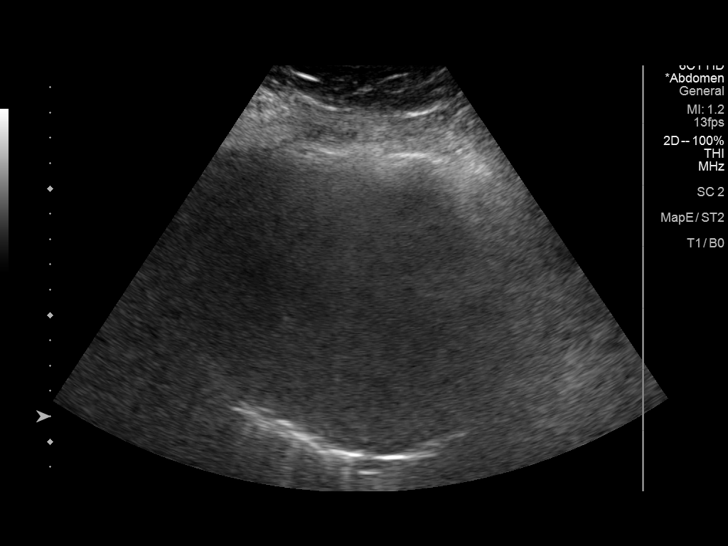
[im 12/32]
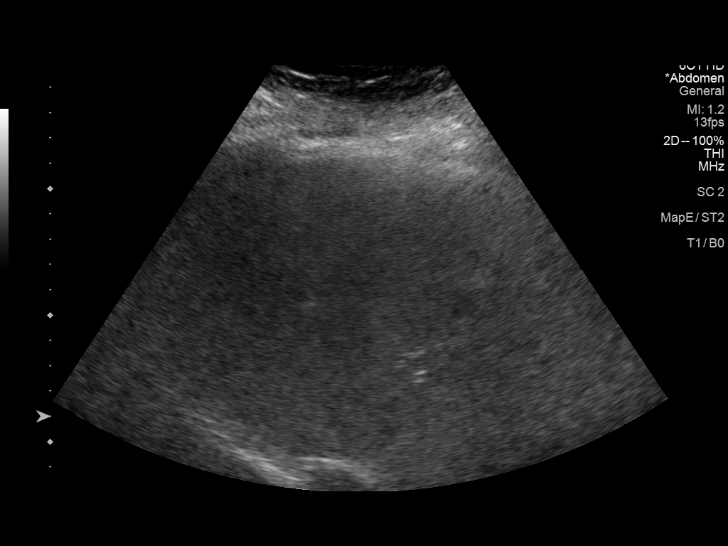
[im 15/32]
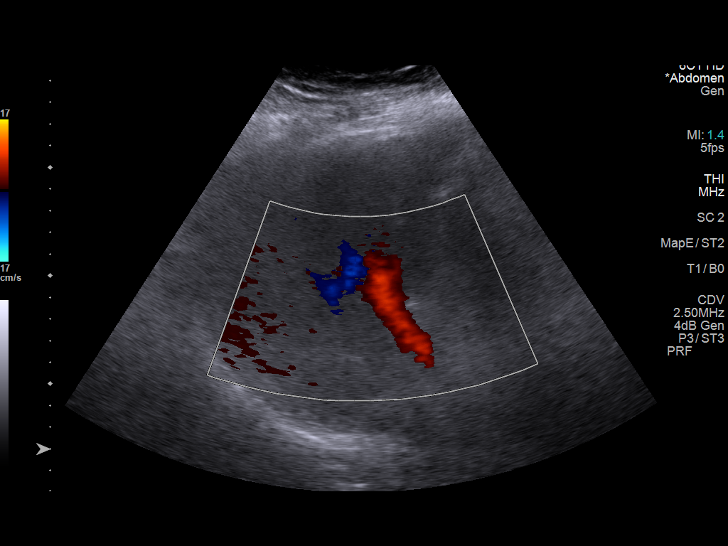
[im 17/32]
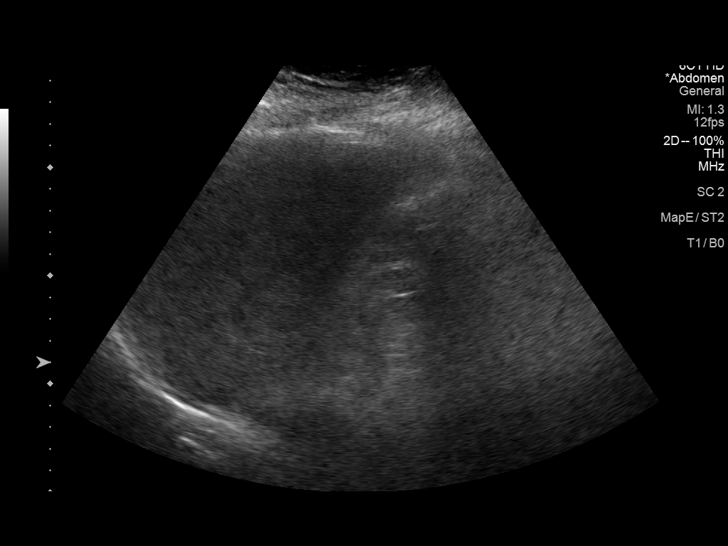
[im 20/32]
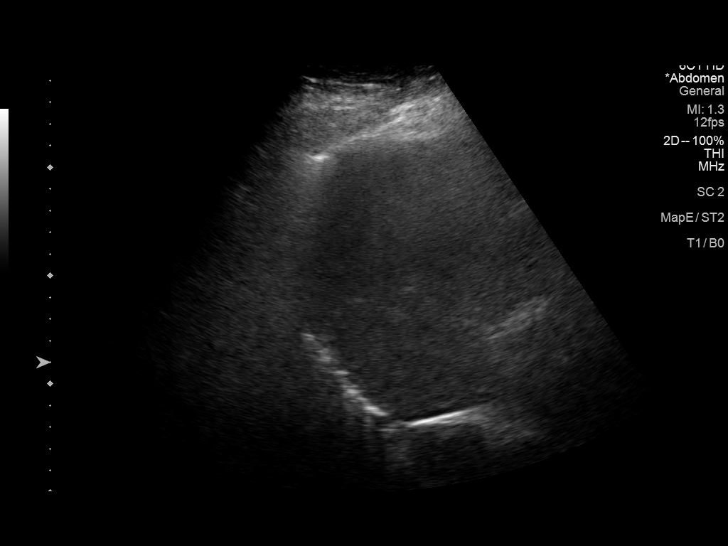
[im 21/32]
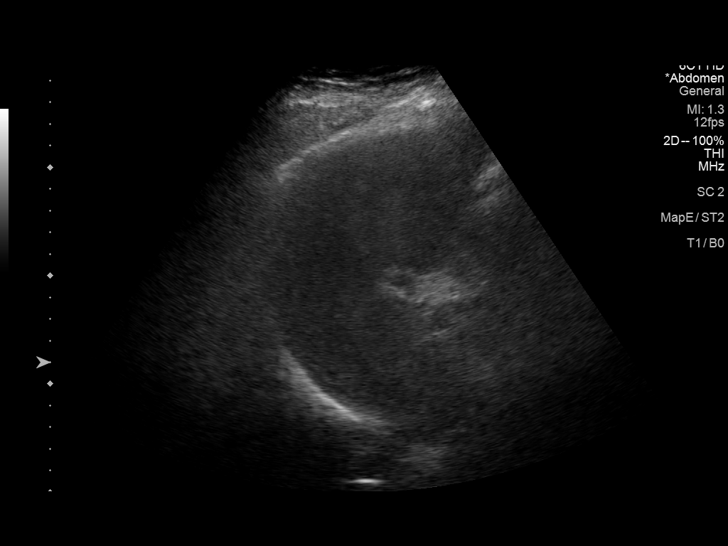
[im 24/32]
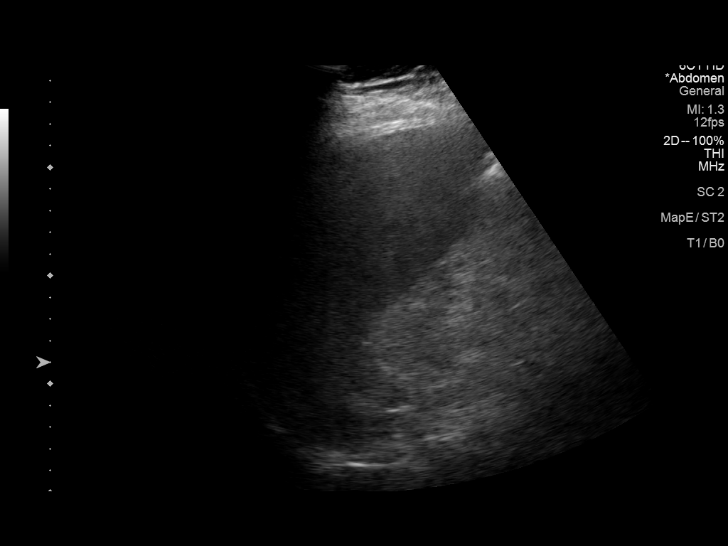
[im 26/32]
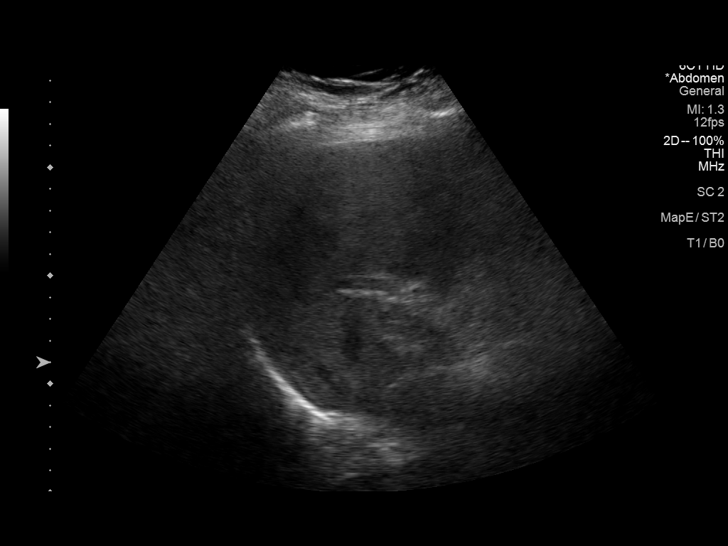
[im 29/32]
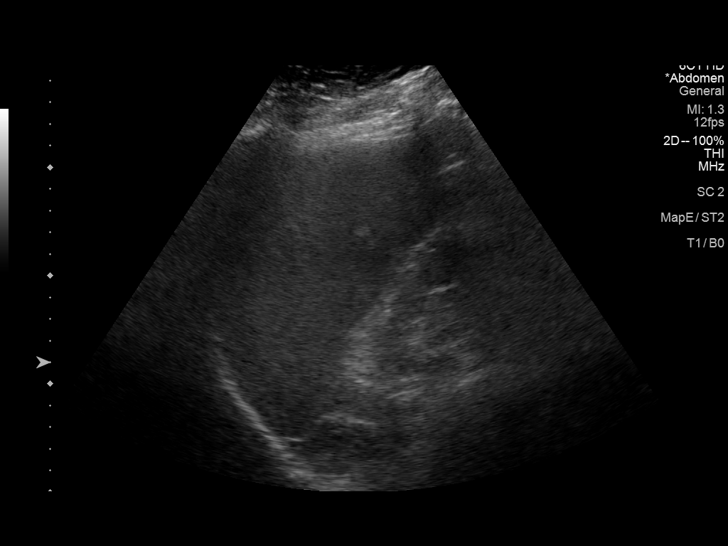
[im 32/32]
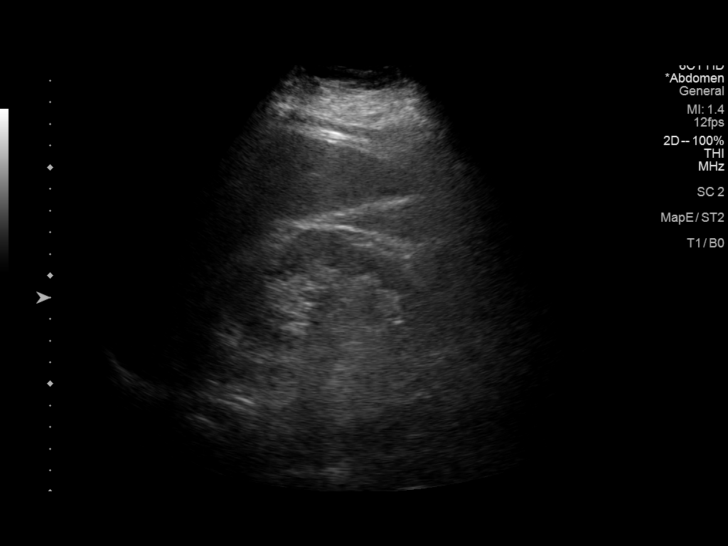

[14 of 25 positions shown; findings below may reference images not displayed]

FINDINGS: Gallbladder:

Surgically absent.

Common bile duct:

Diameter: 3 mm. No intrahepatic or extrahepatic biliary duct
dilatation.

Liver:

No focal lesion identified. Liver echogenicity overall is increased.
Portal vein is patent on color Doppler imaging with normal direction
of blood flow towards the liver.

Other: None.
IMPRESSION: 1. Increased liver echogenicity, a finding indicative of hepatic
steatosis. No focal liver lesions are evident.

2.  Gallbladder absent.

## 2019-03-11 ENCOUNTER — Encounter: Payer: Self-pay | Admitting: Gastroenterology

## 2019-03-12 ENCOUNTER — Ambulatory Visit: Payer: Medicare Other

## 2019-03-20 ENCOUNTER — Ambulatory Visit: Payer: Medicare Other

## 2019-03-29 ENCOUNTER — Ambulatory Visit: Payer: Medicare Other

## 2019-04-05 ENCOUNTER — Other Ambulatory Visit: Payer: Self-pay | Admitting: Physical Medicine and Rehabilitation

## 2019-04-05 DIAGNOSIS — M545 Low back pain, unspecified: Secondary | ICD-10-CM

## 2019-04-12 ENCOUNTER — Ambulatory Visit: Payer: Medicare Other | Admitting: Gastroenterology

## 2019-04-12 ENCOUNTER — Other Ambulatory Visit: Payer: Self-pay

## 2019-04-12 ENCOUNTER — Encounter: Payer: Self-pay | Admitting: Gastroenterology

## 2019-04-12 ENCOUNTER — Other Ambulatory Visit (INDEPENDENT_AMBULATORY_CARE_PROVIDER_SITE_OTHER): Payer: Medicare Other

## 2019-04-12 VITALS — BP 144/70 | HR 60 | Temp 97.6°F | Ht 68.0 in | Wt 264.4 lb

## 2019-04-12 DIAGNOSIS — Z86010 Personal history of colonic polyps: Secondary | ICD-10-CM | POA: Diagnosis not present

## 2019-04-12 DIAGNOSIS — Z800 Family history of malignant neoplasm of digestive organs: Secondary | ICD-10-CM | POA: Diagnosis not present

## 2019-04-12 DIAGNOSIS — Z9884 Bariatric surgery status: Secondary | ICD-10-CM | POA: Diagnosis not present

## 2019-04-12 DIAGNOSIS — R195 Other fecal abnormalities: Secondary | ICD-10-CM

## 2019-04-12 LAB — BASIC METABOLIC PANEL
BUN: 34 mg/dL — ABNORMAL HIGH (ref 6–23)
CO2: 22 mEq/L (ref 19–32)
Calcium: 9.6 mg/dL (ref 8.4–10.5)
Chloride: 111 mEq/L (ref 96–112)
Creatinine, Ser: 1.9 mg/dL — ABNORMAL HIGH (ref 0.40–1.50)
GFR: 34.83 mL/min — ABNORMAL LOW (ref >60.00)
Glucose, Bld: 184 mg/dL — ABNORMAL HIGH (ref 70–99)
Potassium: 4.8 mEq/L (ref 3.5–5.1)
Sodium: 142 mEq/L (ref 135–145)

## 2019-04-12 NOTE — Progress Notes (Signed)
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  Recommended recall colonoscopy in 3 years.  Father had colon cancer in his 64's  C/o back pain, was previously on narcotics for hip pain, became dependednt and is trying to avoid narcotics. Currently taking tylenol and Tramadol He tries avoid NSAID's but takes it occasionally usually once or twice a month.   Reviewed recent labs from PMD Dr. Silvestre Mesi office March 10, 2019 BUN 32, creatinine 1.8, potassium 5.5, sodium 140  Total bilirubin 1.7, AST 19, ALT 37, alk phos 87, albumin 3.9  WBC 5.2, hemoglobin 13, hematocrit 40, MCV 102.7, MCH 33  EGD and Colonoscopy 09/2005 EGD 09/2006 Colonoscopy 12/2010 Colonoscopy 05/2014 EGD 07/2015 EGD and Colonoscopy 01/2017   Outpatient Encounter Medications as of 04/12/2019  Medication Sig  . aspirin EC 81 MG tablet Take 81 mg by mouth daily.  Marland Kitchen atorvastatin (LIPITOR) 40 MG tablet Take 40 mg by mouth daily.  Marland Kitchen b complex vitamins capsule Take 1 capsule by mouth as directed.  Marland Kitchen BYSTOLIC 5 MG tablet Take 5 mg by mouth daily.  . calcium gluconate 500 MG tablet Take 1 tablet by mouth daily.  . cetirizine (ZYRTEC) 10 MG tablet Take 10 mg by mouth daily.  . Cholecalciferol (VITAMIN D3) 5000 units CAPS Take 1 capsule by mouth daily.  . Cyanocobalamin (B-12) 5000 MCG SUBL Place 1 tablet under the tongue as directed.  . Diphenhydramine-APAP, sleep, (TYLENOL PM  EXTRA STRENGTH PO) Take 1 tablet by mouth daily.  . isosorbide mononitrate (IMDUR) 30 MG 24 hr tablet Take 30 mg by mouth daily.  Marland Kitchen JARDIANCE 25 MG TABS tablet Take 1 tablet by mouth daily.  Marland Kitchen losartan (COZAAR) 100 MG tablet Take 100 mg by mouth daily.  . Multiple Vitamin (MULTIVITAMIN) tablet Take 1 tablet by mouth daily.  . Multiple Vitamins-Minerals (PRESERVISION AREDS 2 PO) Take 1 tablet by mouth daily.  . nitroGLYCERIN (NITROSTAT) 0.4 MG SL tablet Place 1 tablet (0.4 mg total) under the tongue every 5 (five) minutes as needed for chest pain.  . pantoprazole (PROTONIX) 40 MG tablet Take 1 tablet by mouth daily.  . Polysaccharide Iron Complex (IRON UP PO) Take 120 mg by mouth daily.  Marland Kitchen tiZANidine (ZANAFLEX) 4 MG tablet Take 4 mg by mouth at bedtime.  . TRADJENTA 5 MG TABS tablet Take 1 tablet by mouth daily.  . traMADol (ULTRAM) 50 MG tablet Take 50 mg by mouth every 6 (six) hours as needed.  . vitamin C (ASCORBIC ACID) 500 MG tablet Take 500 mg by mouth daily.  Marland Kitchen zinc gluconate 50 MG tablet Take 50 mg by mouth daily.   No facility-administered encounter medications on file as of 04/12/2019.    Allergies as of 04/12/2019  . (No Known Allergies)    Past Medical History:  Diagnosis Date  . Anemia   . Basal cell carcinoma   . CAD (coronary artery disease)    Stent 2017.  Stent 2005 (Details of both  stents pending)  . Cataract   . Curvature of spine   . DM (diabetes mellitus) ([place])   . Gallstones   . Hypertension   . Kidney stones   . Macular degeneration   . Obesity   . OSA (obstructive sleep apnea)    CPAP    Past Surgical History:  Procedure Laterality Date  . ABDOMINOPLASTY  02/2009  . ANGIOPLASTY  06/05 05/17   with Stents  . CHOLECYSTECTOMY  2001  . COLONOSCOPY  08/07 11/12 04/16   . GASTRIC BYPASS  10/07/2006  . HERNIA REPAIR Left 1956  . KNEE ARTHROSCOPY Right 04/07 12/14 05/18    x3  . LAMINECTOMY     Lumbar  . REPLACEMENT TOTAL KNEE Left 1994  . TOTAL HIP  ARTHROPLASTY  01/2007    Family History  Problem Relation Age of Onset  . Colon cancer Mother   . Rheum arthritis Father   . Diabetes Paternal Grandmother   . Tuberculosis Paternal Grandfather     Social History   Socioeconomic History  . Marital status: Married    Spouse name: Not on file  . Number of children: Not on file  . Years of education: Not on file  . Highest education level: Not on file  Occupational History  . Not on file  Tobacco Use  . Smoking status: Never Smoker  . Smokeless tobacco: Never Used  Substance and Sexual Activity  . Alcohol use: Never  . Drug use: Never  . Sexual activity: Not on file  Other Topics Concern  . Not on file  Social History Narrative  . Not on file   Social Determinants of Health   Financial Resource Strain:   . Difficulty of Paying Living Expenses: Not on file  Food Insecurity:   . Worried About Charity fundraiser in the Last Year: Not on file  . Ran Out of Food in the Last Year: Not on file  Transportation Needs:   . Lack of Transportation (Medical): Not on file  . Lack of Transportation (Non-Medical): Not on file  Physical Activity:   . Days of Exercise per Week: Not on file  . Minutes of Exercise per Session: Not on file  Stress:   . Feeling of Stress : Not on file  Social Connections:   . Frequency of Communication with Friends and Family: Not on file  . Frequency of Social Gatherings with Friends and Family: Not on file  . Attends Religious Services: Not on file  . Active Member of Clubs or Organizations: Not on file  . Attends Archivist Meetings: Not on file  . Marital Status: Not on file  Intimate Partner Violence:   . Fear of Current or Ex-Partner: Not on file  . Emotionally Abused: Not on file  . Physically Abused: Not on file  . Sexually Abused: Not on file      Review of systems: All other review of systems negative except as mentioned in the HPI.   Physical Exam: Vitals:   04/12/19  1004  Temp: 97.6 F (36.4 C)   Body mass index is 40.2 kg/m. Gen:      No acute distress Abd:      + bowel sounds; soft, non-tender; no palpable masses, no distension Neuro: alert and oriented x 3 Psych: normal mood and affect  Data Reviewed:  Reviewed labs, radiology imaging, old records and pertinent past GI work up   Assessment and Plan/Recommendations:  74 year old male with obesity status post gastric  Roux-en-Y for evaluation of positive hemosure  He has family history of colon cancer in father and personal history of adenomatous colon polyps, inadequate bowel prep on last colonoscopy.  He uses NSAIDs intermittently, s/p gastric bypass surgery.  Will need to exclude anastomotic ulcer  We will proceed with EGD and colonoscopy for further evaluation of positive immature to exclude neoplastic lesion or malignancy The risks and benefits as well as alternatives of endoscopic procedure(s) have been discussed and reviewed. All questions answered. The patient agrees to proceed.  Elevated potassium on review of labs, recheck BMP Advised patient to avoid potassium supplements  The risks and benefits as well as alternatives of endoscopic procedure(s) have been discussed and reviewed. All questions answered. The patient agrees to proceed.  The patient was provided an opportunity to ask questions and all were answered. The patient agreed with the plan and demonstrated an understanding of the instructions.  Damaris Hippo , MD    CC: Crist Infante, MD

## 2019-04-12 NOTE — Patient Instructions (Addendum)
AVOID potassium supplement, please check label for multivitamins  We have given you a sample kit of Suptab today for your prep   Go to the basement for labs today  If you are age 74 or older, your body mass index should be between 23-30. Your Body mass index is 40.2 kg/m. If this is out of the aforementioned range listed, please consider follow up with your Primary Care Provider.  If you are age 45 or younger, your body mass index should be between 19-25. Your Body mass index is 40.2 kg/m. If this is out of the aformentioned range listed, please consider follow up with your Primary Care Provider.    You have been scheduled for an endoscopy and colonoscopy. Please follow the written instructions given to you at your visit today. Please pick up your prep supplies at the pharmacy within the next 1-3 days. If you use inhalers (even only as needed), please bring them with you on the day of your procedure.   I appreciate the  opportunity to care for you  Thank You   Harl Bowie , MD

## 2019-04-29 ENCOUNTER — Ambulatory Visit
Admission: RE | Admit: 2019-04-29 | Discharge: 2019-04-29 | Disposition: A | Discharge status: Home / Self Care | Payer: Medicare Other | Source: Ambulatory Visit | Attending: Physical Medicine and Rehabilitation | Admitting: Physical Medicine and Rehabilitation

## 2019-04-29 DIAGNOSIS — M545 Low back pain, unspecified: Secondary | ICD-10-CM

## 2019-04-29 IMAGING — MR MR LUMBAR SPINE W/O CM
4 of 5 series · 24 of 48 positions shown · non-contrast
Comparison: Radiography [DATE]

CLINICAL DATA: Low back pain worsening over the last year.

EXAM:
MRI LUMBAR SPINE WITHOUT CONTRAST
TECHNIQUE: Multiplanar, multisequence MR imaging of the lumbar spine was
performed. No intravenous contrast was administered.

[Series 2: T2 · sagittal · 4.0mm · 0.55mm/px · 6 of 18 slices shown (1 of 2)]
[im 1/18]
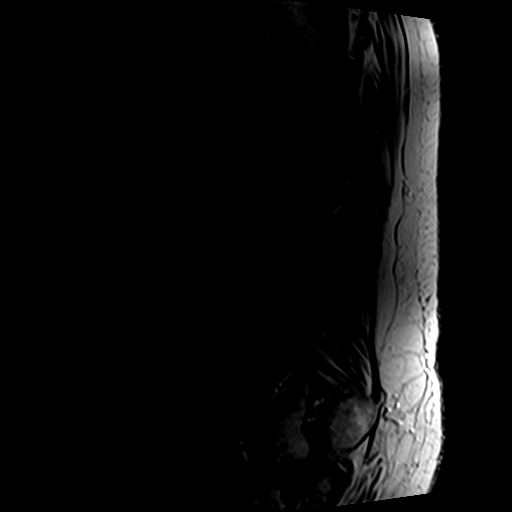
[im 4/18]
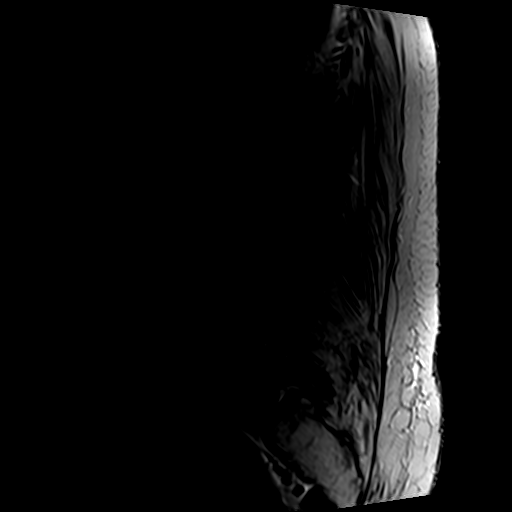
[im 7/18]
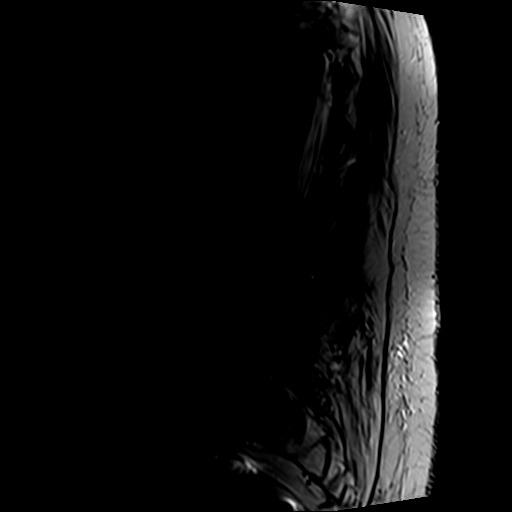
[im 11/18]
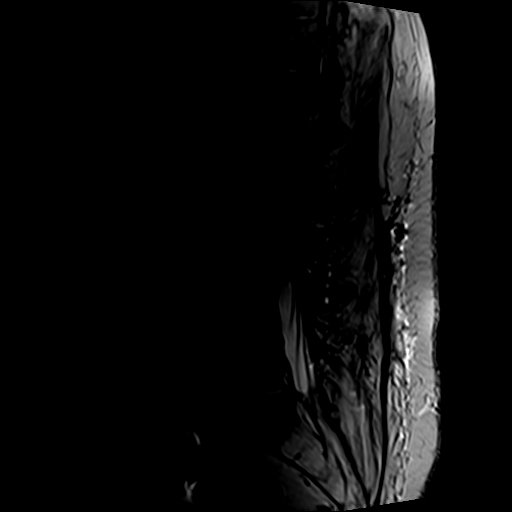
[im 14/18]
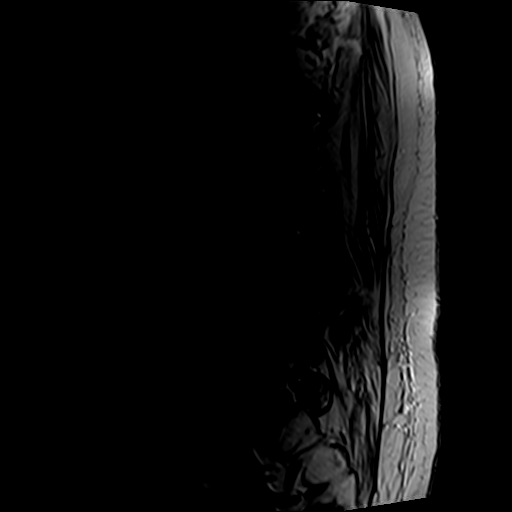
[im 18/18]
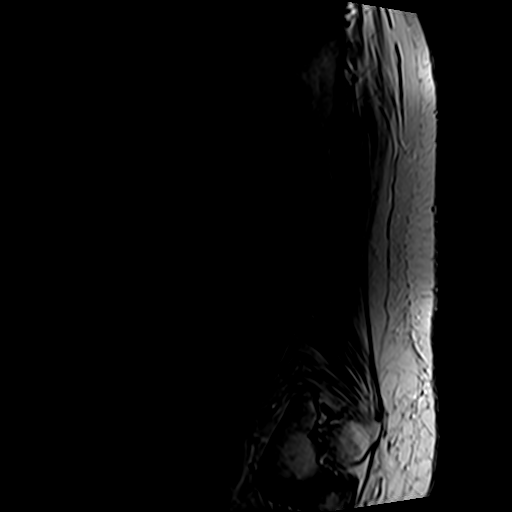

[Series 4: T1 · sagittal · 4.0mm · 0.55mm/px · 7 of 18 slices shown (1 of 2)]
[im 1/18]
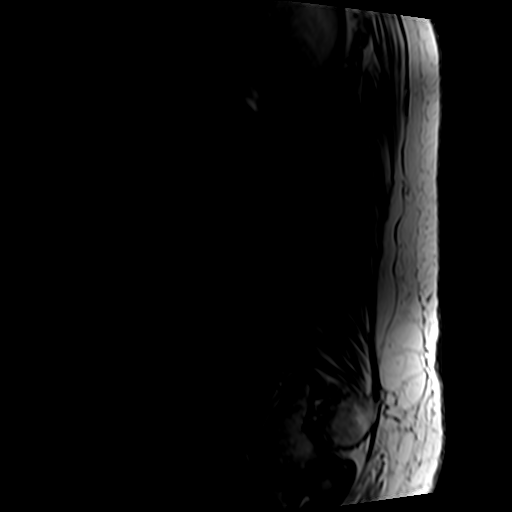
[im 3/18]
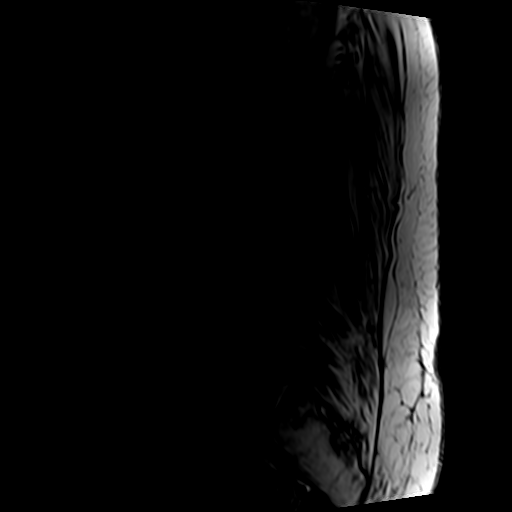
[im 6/18]
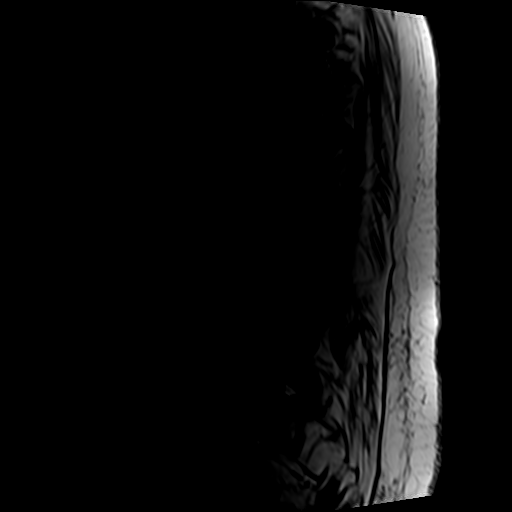
[im 9/18]
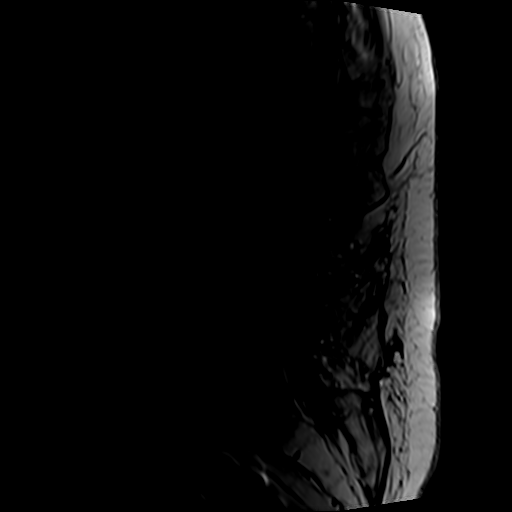
[im 12/18]
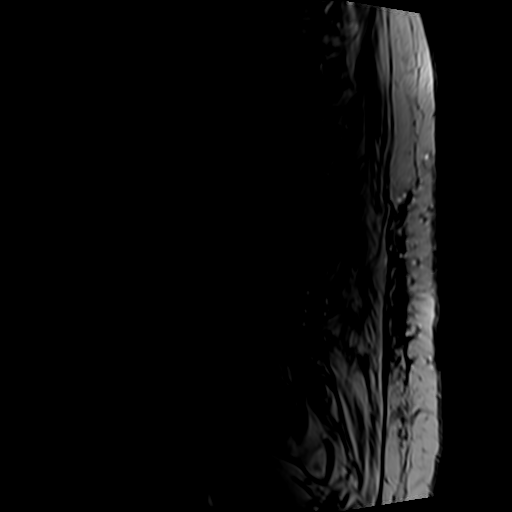
[im 15/18]
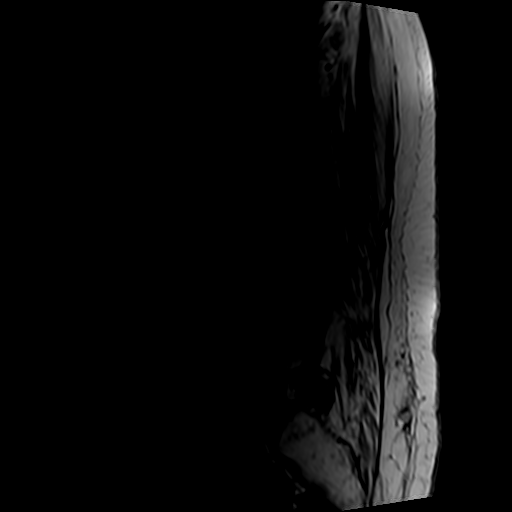
[im 18/18]
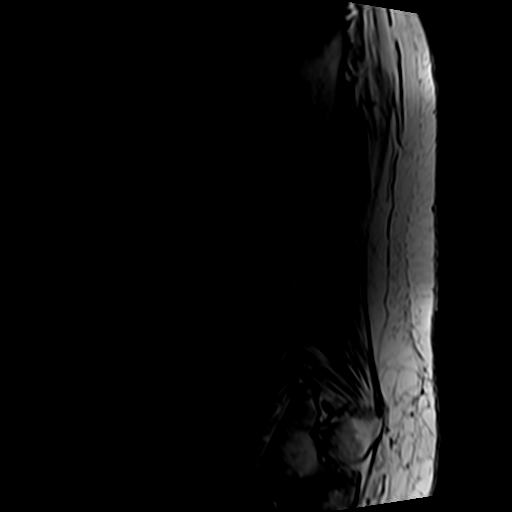

[Series 5: T2 · axial · 4.0mm · 0.70mm/px · z∈[-82,+114]mm · 8 of 35 slices shown (2 of 2)]
[im 1/35]
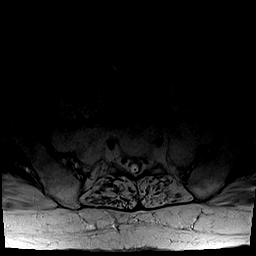
[im 6/35]
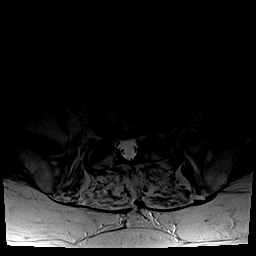
[im 11/35]
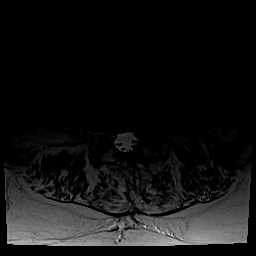
[im 16/35]
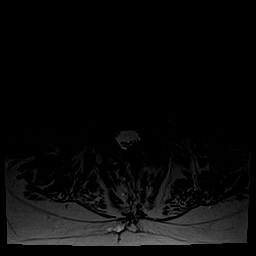
[im 19/35]
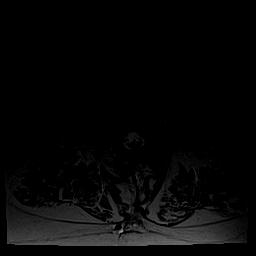
[im 24/35]
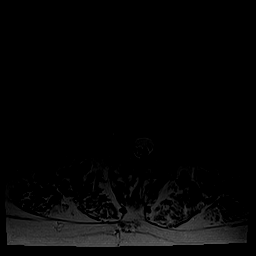
[im 29/35]
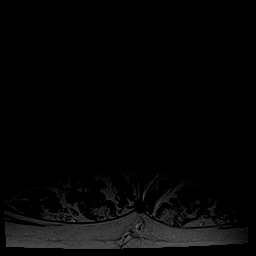
[im 35/35]
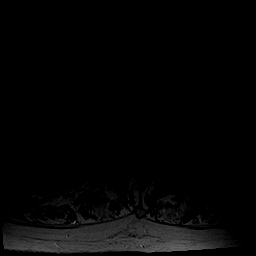

[Series 6: T1 · axial · 4.0mm · 0.35mm/px · z∈[-56,+83]mm · 3 of 35 slices shown (2 of 2)]
[im 6/35]
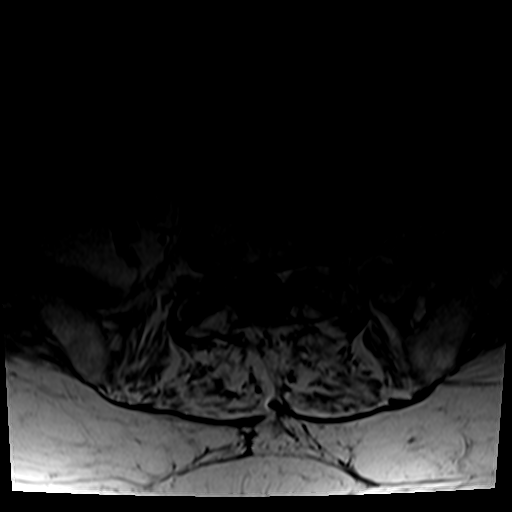
[im 19/35]
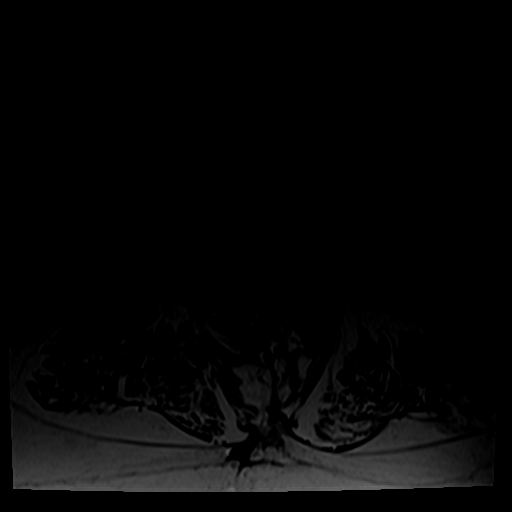
[im 29/35]
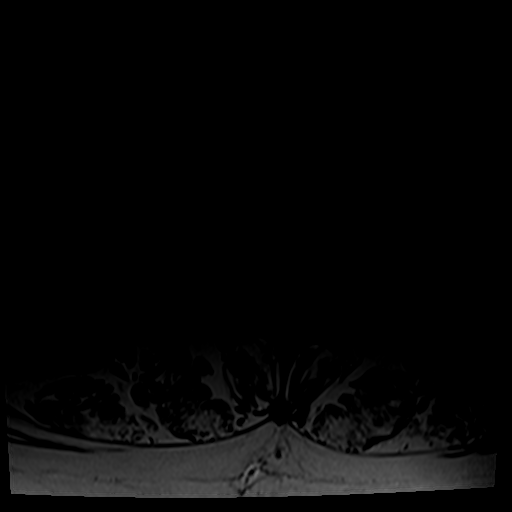

[24 of 48 positions shown; findings below may reference images not displayed]

FINDINGS: Segmentation:  5 lumbar type vertebral bodies.

Alignment: Thoracolumbar curvature convex to the left with the apex
at L1-2

Vertebrae: No fracture or focal lesion. Discogenic edema of the
endplates at the L2-3 level which could be associated with back
pain.

Conus medullaris and cauda equina: Conus extends to the L1 level.
Conus and cauda equina appear normal.

Paraspinal and other soft tissues: Negative

Disc levels:

Apparent chronic fusion at T10-11 and T11-12.

T12-L1: Minimal bulging of the disc.  No stenosis.

L1-2: Endplate osteophytes and bulging of the disc more prominent
towards the right. Mild narrowing of the right lateral recess but no
compressive stenosis.

L2-3: Previous posterior decompression. Endplate osteophytes and
bulging of the disc. Mild narrowing of the left lateral recess but
no apparent compressive stenosis. As noted above, there is
discogenic edema of the endplates at this level which could be
associated with back pain.

L3-4: Previous posterior decompression.  No stenosis.

L4-5: Previous posterior decompression. Apparent fusion of the
vertebral bodies. No stenosis.

L5-S1: Previous laminectomies. Small endplate osteophytes. No
compressive stenosis.
IMPRESSION: L1-2: Disc bulge more prominent towards the right. Mild narrowing of
the right lateral recess but no likely neural compression.

L2-3: Disc bulge. Previous posterior decompression discogenic
endplate marrow changes with some edema. This can often be
correlated with back pain. Mild narrowing of the lateral recesses no
apparent compressive stenosis.

L3-4: Previous posterior decompression.  No stenosis.

L4-5: Previous posterior decompression. Fusion across the disc
space. No stenosis.

L5-S1: Previous posterior decompression. Mild bulging of the disc.
No stenosis.

## 2019-04-30 ENCOUNTER — Encounter: Payer: Self-pay | Admitting: Gastroenterology

## 2019-05-03 ENCOUNTER — Ambulatory Visit (AMBULATORY_SURGERY_CENTER): Payer: Medicare Other | Admitting: Gastroenterology

## 2019-05-03 ENCOUNTER — Encounter: Payer: Self-pay | Admitting: Gastroenterology

## 2019-05-03 ENCOUNTER — Other Ambulatory Visit: Payer: Self-pay

## 2019-05-03 VITALS — BP 109/64 | HR 48 | Temp 96.9°F | Resp 13 | Ht 68.0 in | Wt 264.0 lb

## 2019-05-03 DIAGNOSIS — D122 Benign neoplasm of ascending colon: Secondary | ICD-10-CM

## 2019-05-03 DIAGNOSIS — K2990 Gastroduodenitis, unspecified, without bleeding: Secondary | ICD-10-CM

## 2019-05-03 DIAGNOSIS — D123 Benign neoplasm of transverse colon: Secondary | ICD-10-CM | POA: Diagnosis not present

## 2019-05-03 DIAGNOSIS — K2970 Gastritis, unspecified, without bleeding: Secondary | ICD-10-CM | POA: Diagnosis not present

## 2019-05-03 DIAGNOSIS — K259 Gastric ulcer, unspecified as acute or chronic, without hemorrhage or perforation: Secondary | ICD-10-CM | POA: Diagnosis not present

## 2019-05-03 DIAGNOSIS — D120 Benign neoplasm of cecum: Secondary | ICD-10-CM | POA: Diagnosis not present

## 2019-05-03 DIAGNOSIS — Z86010 Personal history of colonic polyps: Secondary | ICD-10-CM

## 2019-05-03 DIAGNOSIS — D125 Benign neoplasm of sigmoid colon: Secondary | ICD-10-CM

## 2019-05-03 DIAGNOSIS — R195 Other fecal abnormalities: Secondary | ICD-10-CM

## 2019-05-03 MED ORDER — SODIUM CHLORIDE 0.9 % IV SOLN: 500.0000 mL | Freq: Once | INTRAVENOUS | Status: DC

## 2019-05-03 NOTE — Progress Notes (Signed)
Report to PACU, RN, vss, BBS= Clear.  

## 2019-05-03 NOTE — Op Note (Signed)
[facility] Patient Name: Delia Sitar Procedure Date: 05/03/2019 2:17 PM MRN: [id number] Endoscopist: Mauri Pole , MD Age: 74 Referring MD:  Date of Birth: 1945-08-10 Gender: Male Account #: [id number] Procedure:                Upper GI endoscopy Indications:              Gastrointestinal bleeding of unknown origin Medicines:                Monitored Anesthesia Care Procedure:                Pre-Anesthesia Assessment:                           - Prior to the procedure, a History and Physical                            was performed, and patient medications and                            allergies were reviewed. The patient's tolerance of                            previous anesthesia was also reviewed. The risks                            and benefits of the procedure and the sedation                            options and risks were discussed with the patient.                            All questions were answered, and informed consent                            was obtained. Prior Anticoagulants: The patient has                            taken no previous anticoagulant or antiplatelet                            agents. ASA Grade Assessment: III - A patient with                            severe systemic disease. After reviewing the risks                            and benefits, the patient was deemed in                            satisfactory condition to undergo the procedure.                           After obtaining informed consent, the endoscope was  passed under direct vision. Throughout the                            procedure, the patient's blood pressure, pulse, and                            oxygen saturations were monitored continuously. The                            Endoscope was introduced through the mouth, and                            advanced to the afferent and efferent jejunal                            loops.  The upper GI endoscopy was accomplished                            without difficulty. The patient tolerated the                            procedure well. Scope In: Scope Out: Findings:                 The Z-line was regular and was found 40 cm from the                            incisors.                           No gross lesions were noted in the entire esophagus.                           Evidence of a gastric bypass was found. A gastric                            pouch was found. The staple line appeared intact.                            The gastrojejunal anastomosis was characterized by                            congestion, erosion, erythema and friable mucosa.                            The jejunojejunal anastomosis was characterized by                            healthy appearing mucosa. Biopsies were taken with                            a cold forceps for histology.                           The examined jejunum was normal. Complications:  No immediate complications. Estimated Blood Loss:     Estimated blood loss was minimal. Impression:               - Z-line regular, 40 cm from the incisors.                           - No gross lesions in esophagus.                           - Gastric bypass with intact staple line.                            Gastrojejunal anastomosis characterized by                            congestion, erosion, erythema and friable mucosa.                            Biopsied.                           - Normal examined jejunum. Recommendation:           - Resume previous diet.                           - Continue present medications.                           - No aspirin, ibuprofen, naproxen, or other                            non-steroidal anti-inflammatory drugs.                           - Await pathology results.                           - See the other procedure note for documentation of                            additional  recommendations. Mauri Pole, MD 05/03/2019 2:54:06 PM This report has been signed electronically.

## 2019-05-03 NOTE — Progress Notes (Signed)
Called to room to assist during endoscopic procedure.  Patient ID and intended procedure confirmed with present staff. Received instructions for my participation in the procedure from the performing physician.  

## 2019-05-03 NOTE — Patient Instructions (Signed)
No aspirin, ibuprofen, naproxen, or other non-steroidal andit-inflammatory drugs.  Handouts provided on gastritis, polyps, diverticulosis and hemorrhoids.  YOU HAD AN ENDOSCOPIC PROCEDURE TODAY AT [facility]:   Refer to the procedure report that was given to you for any specific questions about what was found during the examination.  If the procedure report does not answer your questions, please call your gastroenterologist to clarify.  If you requested that your care partner not be given the details of your procedure findings, then the procedure report has been included in a sealed envelope for you to review at your convenience later.  YOU SHOULD EXPECT: Some feelings of bloating in the abdomen. Passage of more gas than usual.  Walking can help get rid of the air that was put into your GI tract during the procedure and reduce the bloating. If you had a lower endoscopy (such as a colonoscopy or flexible sigmoidoscopy) you may notice spotting of blood in your stool or on the toilet paper. If you underwent a bowel prep for your procedure, you may not have a normal bowel movement for a few days.  Please Note:  You might notice some irritation and congestion in your nose or some drainage.  This is from the oxygen used during your procedure.  There is no need for concern and it should clear up in a day or so.  SYMPTOMS TO REPORT IMMEDIATELY:   Following lower endoscopy (colonoscopy or flexible sigmoidoscopy):  Excessive amounts of blood in the stool  Significant tenderness or worsening of abdominal pains  Swelling of the abdomen that is new, acute  Fever of 100F or higher   Following upper endoscopy (EGD)  Vomiting of blood or coffee ground material  New chest pain or pain under the shoulder blades  Painful or persistently difficult swallowing  New shortness of breath  Fever of 100F or higher  Black, tarry-looking stools  For urgent or emergent issues, a gastroenterologist  can be reached at any hour by calling [phone]. Do not use MyChart messaging for urgent concerns.    DIET:  We do recommend a small meal at first, but then you may proceed to your regular diet.  Drink plenty of fluids but you should avoid alcoholic beverages for 24 hours.  ACTIVITY:  You should plan to take it easy for the rest of today and you should NOT DRIVE or use heavy machinery until tomorrow (because of the sedation medicines used during the test).    FOLLOW UP: Our staff will call the number listed on your records 48-72 hours following your procedure to check on you and address any questions or concerns that you may have regarding the information given to you following your procedure. If we do not reach you, we will leave a message.  We will attempt to reach you two times.  During this call, we will ask if you have developed any symptoms of COVID 19. If you develop any symptoms (ie: fever, flu-like symptoms, shortness of breath, cough etc.) before then, please call [phone].  If you test positive for Covid 19 in the 2 weeks post procedure, please call and report this information to [place].    If any biopsies were taken you will be contacted by phone or by letter within the next 1-3 weeks.  Please call us at [phone] if you have not heard about the biopsies in 3 weeks.    SIGNATURES/CONFIDENTIALITY: You and/or your care partner have signed paperwork which will be  entered into your electronic medical record.  These signatures attest to the fact that that the information above on your After Visit Summary has been reviewed and is understood.  Full responsibility of the confidentiality of this discharge information lies with you and/or your care-partner.

## 2019-05-03 NOTE — Progress Notes (Signed)
[facility] Temp-[facility]  History reviewed 

## 2019-05-03 NOTE — Op Note (Signed)
[facility] Patient Name: Carlos Moreno Procedure Date: 05/03/2019 2:16 PM MRN: [id number] Endoscopist: Mauri Pole , MD Age: 74 Referring MD:  Date of Birth: 03-Jun-1945 Gender: Male Account #: [id number] Procedure:                Colonoscopy Indications:              High risk colon cancer surveillance: Personal                            history of colonic polyps, High risk colon cancer                            surveillance: Personal history of multiple (3 or                            more) adenomas Medicines:                Monitored Anesthesia Care Procedure:                Pre-Anesthesia Assessment:                           - Prior to the procedure, a History and Physical                            was performed, and patient medications and                            allergies were reviewed. The patient's tolerance of                            previous anesthesia was also reviewed. The risks                            and benefits of the procedure and the sedation                            options and risks were discussed with the patient.                            All questions were answered, and informed consent                            was obtained. Prior Anticoagulants: The patient has                            taken no previous anticoagulant or antiplatelet                            agents. ASA Grade Assessment: III - A patient with                            severe systemic disease. After reviewing the risks  and benefits, the patient was deemed in                            satisfactory condition to undergo the procedure.                           After obtaining informed consent, the colonoscope                            was passed under direct vision. Throughout the                            procedure, the patient's blood pressure, pulse, and                            oxygen saturations were monitored  continuously. The                            Colonoscope was introduced through the anus and                            advanced to the the cecum, identified by                            appendiceal orifice and ileocecal valve. The                            colonoscopy was performed without difficulty. The                            patient tolerated the procedure well. The quality                            of the bowel preparation was excellent. The                            ileocecal valve, appendiceal orifice, and rectum                            were photographed. Scope In: 2:26:28 PM Scope Out: 2:47:59 PM Scope Withdrawal Time: 0 hours 13 minutes 32 seconds  Total Procedure Duration: 0 hours 21 minutes 31 seconds  Findings:                 The perianal and digital rectal examinations were                            normal.                           Two sessile polyps were found in the transverse                            colon and cecum. The polyps were 4 to 6 mm in size.  These polyps were removed with a cold snare.                            Resection and retrieval were complete.                           A less than 1 mm polyp was found in the cecum. The                            polyp was sessile. The polyp was removed with a                            cold biopsy forceps. Resection and retrieval were                            complete.                           Scattered small and large-mouthed diverticula were                            found in the sigmoid colon, descending colon,                            transverse colon and ascending colon. There was                            evidence of an impacted diverticulum.                           External and internal hemorrhoids were found during                            retroflexion. The hemorrhoids were large. Complications:            No immediate complications. Estimated Blood Loss:      Estimated blood loss was minimal. Impression:               - Two 4 to 6 mm polyps in the transverse colon and                            in the cecum, removed with a cold snare. Resected                            and retrieved.                           - One less than 1 mm polyp in the cecum, removed                            with a cold biopsy forceps. Resected and retrieved.                           - Moderate diverticulosis in the sigmoid colon, in  the descending colon, in the transverse colon and                            in the ascending colon. There was evidence of an                            impacted diverticulum.                           - External and internal hemorrhoids. Recommendation:           - Patient has a contact number available for                            emergencies. The signs and symptoms of potential                            delayed complications were discussed with the                            patient. Return to normal activities tomorrow.                            Written discharge instructions were provided to the                            patient.                           - Resume previous diet.                           - Continue present medications.                           - Await pathology results.                           - Repeat colonoscopy in 3 - 5 years for                            surveillance based on pathology results.                           - Return to GI clinic at the next available                            appointment. Mauri Pole, MD 05/03/2019 2:57:01 PM This report has been signed electronically.

## 2019-05-05 ENCOUNTER — Telehealth: Payer: Self-pay

## 2019-05-05 NOTE — Telephone Encounter (Signed)
  Follow up Call-  Call back number 05/03/2019  Post procedure Call Back phone  # [phone]  Permission to leave phone message Yes     Patient questions:  Do you have a fever, pain , or abdominal swelling? No. Pain Score  0 *  Have you tolerated food without any problems? Yes.    Have you been able to return to your normal activities? Yes.    Do you have any questions about your discharge instructions: Diet   No. Medications  No. Follow up visit  No.  Do you have questions or concerns about your Care? No.  Actions: * If pain score is 4 or above: No action needed, pain <4.  1. Have you developed a fever since your procedure? no  2.   Have you had an respiratory symptoms (SOB or cough) since your procedure? no 3.   Have you tested positive for COVID 19 since your procedure no  4.   Have you had any family members/close contacts diagnosed with the COVID 19 since your procedure?  no   If yes to any of these questions please route to Joylene John, RN and Erenest Rasher, RN

## 2019-05-07 ENCOUNTER — Encounter: Payer: Self-pay | Admitting: Gastroenterology

## 2019-05-11 ENCOUNTER — Other Ambulatory Visit: Payer: Self-pay

## 2019-05-18 ENCOUNTER — Ambulatory Visit: Payer: Medicare Other | Attending: Physical Medicine and Rehabilitation | Admitting: Physical Therapy

## 2019-05-18 ENCOUNTER — Encounter: Payer: Self-pay | Admitting: Physical Therapy

## 2019-05-18 ENCOUNTER — Other Ambulatory Visit: Payer: Self-pay

## 2019-05-18 DIAGNOSIS — R29898 Other symptoms and signs involving the musculoskeletal system: Secondary | ICD-10-CM | POA: Insufficient documentation

## 2019-05-18 DIAGNOSIS — M545 Low back pain: Secondary | ICD-10-CM | POA: Insufficient documentation

## 2019-05-18 DIAGNOSIS — R262 Difficulty in walking, not elsewhere classified: Secondary | ICD-10-CM | POA: Insufficient documentation

## 2019-05-18 DIAGNOSIS — M25551 Pain in right hip: Secondary | ICD-10-CM | POA: Insufficient documentation

## 2019-05-18 DIAGNOSIS — G8929 Other chronic pain: Secondary | ICD-10-CM | POA: Diagnosis present

## 2019-05-18 NOTE — Therapy (Signed)
[place], [place], [address] Phone: [phone]   Fax:  [phone]  Physical Therapy Evaluation  Patient Details  Name: Carlos Moreno MRN: [id number] Date of Birth: 10/15/1945 Referring Provider (PT): Dr Laroy Apple   Encounter Date: 05/18/2019  PT End of Session - 05/18/19 0918    Visit Number  1    Number of Visits  8    Date for PT Re-Evaluation  06/15/19   4 wk trail   Authorization Type  MCR/AARP    Authorization Time Period  pnote 10th visist    PT Start Time  0918    PT Stop Time  1004    PT Time Calculation (min)  46 min    Activity Tolerance  Patient tolerated treatment well    Behavior During Therapy  [facility] for tasks assessed/performed       Past Medical History:  Diagnosis Date  . Anemia   . Basal cell carcinoma   . CAD (coronary artery disease)    Stent 2017.  Stent 2005 (Details of both stents pending)  . Cataract   . Curvature of spine   . DM (diabetes mellitus) ([place])   . Gallstones   . Hypertension   . Kidney stones   . Macular degeneration   . Obesity   . OSA (obstructive sleep apnea)    CPAP    Past Surgical History:  Procedure Laterality Date  . ABDOMINOPLASTY  02/2009  . ANGIOPLASTY  06/05 05/17   with Stents  . CATARACT EXTRACTION, BILATERAL  03/2007  . CHOLECYSTECTOMY  2001  . COLONOSCOPY  08/07 11/12 04/16   . HIP SURGERY Left   . INGUINAL HERNIA REPAIR Left 1956  . KNEE ARTHROSCOPY Right 05/2005   x3  . LAMINECTOMY  2009   Lumbar  . REPLACEMENT TOTAL KNEE Left 1994  . REPLACEMENT TOTAL KNEE Right    x 3  . 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 TOTAL HIP ARTHROPLASTY Right 01/2007    There were no vitals filed for this visit.   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 ( arthritis of the spine) the pain intensified about 4  months ago pain ranges 5-9/10    Pertinent History  TDD:UKGURK,YHC,WCB,JSE,GB TKA 94, Rt TKA with 3 revisions last 5/18, lumbar laminectomy 2009.    Patient Stated Goals  pt not sure what PT can do to alievate the pain, MD wants to try this before having an injection    Currently in Pain?  Yes    Pain Score  6     Pain Location  Back    Pain Orientation  Lower    Pain Descriptors / Indicators  Sharp    Pain Type  Chronic pain    Pain Onset  More than a month ago    Pain Frequency  Constant    Aggravating Factors   lying down and trying to sleep, lifting    Pain Relieving Factors  muslce relaxer ( gets 5 hrs of relief) heat ( TENS doesn't seem to help)         [facility] PT Assessment - 05/18/19 0001      Assessment   Medical Diagnosis  LBP    Referring Provider (PT)  Dr Laroy Apple    Onset Date/Surgical Date  01/17/19  Hand Dominance  Right    Next MD Visit  06/10/2019    Prior Therapy  yes      Precautions   Precautions  None      Balance Screen   Has the patient fallen in the past 6 months  No    Has the patient had a decrease in activity level because of a fear of falling?   Yes   more related to his knees   Is the patient reluctant to leave their home because of a fear of falling?   No      Home Film/video editor residence    Living Arrangements  Spouse/significant other    Home Layout  One level      Prior Function   Level of Independence  Independent with basic ADLs    Vocation  Retired    [employer],     Leisure  Scientist, research (medical), scotch collection      Observation/Other Assessments   Focus on Therapeutic Outcomes (FOTO)   46% limited      Posture/Postural Control   Posture/Postural Control  Postural limitations    Postural Limitations  Decreased lumbar lordosis   obesity, thoracic shift to the left      AROM   AROM Assessment Site  Lumbar;Hip;Knee    Right/Left Hip  --   [facility]    Right/Left Knee  --    flex Lt 120, rt 110 full extension   Lumbar Flexion  2" from the floor - pain consistent    Lumbar Extension  motion comes form hips and knees     Lumbar - Right Rotation  WNL pulling in Lt low back    Lumbar - Left Rotation  WNL      Strength   Strength Assessment Site  Hip;Knee;Ankle;Lumbar    Right/Left Hip  Left;Right    Right Hip Flexion  5/5    Right Hip Extension  --   difficult to assess d/t back pain with lifting   Left Hip Flexion  4+/5    Left Hip Extension  --   difficult to asses d/t back  pain   Right/Left Knee  --   5/5   Right/Left Ankle  --   5/5   Lumbar Flexion  --   poor TA    Lumbar Extension  --   lumbar multifidi poor      Flexibility   Soft Tissue Assessment /Muscle Length  yes    Hamstrings  SLR in supine Rt ~ 75, Lt 85      Palpation   Spinal mobility  not formally assessed however no lumbar mobility observed with general motion    Palpation comment  tight and tender in lumbar paraspinals and QL Lt > Rt       Bed Mobility   Bed Mobility  --   painfull               Objective measurements completed on examination: See above findings.      [facility] Adult PT Treatment/Exercise - 05/18/19 0001      Knee/Hip Exercises: Standing   Hip Extension  Stengthening;Both;10 reps    Extension Limitations  hands on wall to promote upright posture     Other Standing Knee Exercises  10 reps trunk extension with hands on low back for safety      Knee/Hip Exercises: Seated   Other Seated Knee/Hip Exercises  10 reps 5 sec  hold isometric hip flexion for core activation             PT Education - 05/18/19 1001    Education Details  HEP and POC    Person(s) Educated  Patient    Methods  Explanation;Demonstration;Handout    Comprehension  Returned demonstration;Verbalized understanding          PT Long Term Goals - 05/18/19 1016      PT LONG TERM GOAL #1   Title  Pt will be I and compliant with HEP to include a formal walking program  ( 06/15/2019)    Time  4    Period  Weeks    Status  New    Target Date  06/15/19      PT LONG TERM GOAL #2   Title  perform bed mobility and transfers with minimal to no pain in his back ( 06/15/2019)    Time  4    Period  Weeks    Status  New    Target Date  06/15/19      PT LONG TERM GOAL #3   Title  pt will report =/> 50% improvement in his sleeping ( 06/15/2019)    Time  4    Period  Weeks    Status  New    Target Date  06/15/19      PT LONG TERM GOAL #4   Title  improve FOTO =/< 43% limited ( 06/15/2019)    Time  4    Period  Weeks    Status  New    Target Date  06/15/19             Plan - 05/18/19 1009    Clinical Impression Statement  74 yo male with multiple medical and orthopedic issues.  He has a reoccurrence of low back pain that began to intensify about 4 months ago.  He is currently not doing any formal exercise.  He has pain with all activities and it is limiting him.  Overall lower body is strong however he is very weak in the core.  All spinal curves are reduced and he has minimal spinal movement with walking and transfers.  He is referred to PT for a trial to see if we can help decrease pain and imporve functional abilitities.    Personal Factors and Comorbidities  Comorbidity 3+;Past/Current Experience    Comorbidities  refer to snap shot    Examination-Activity Limitations  Bed Mobility;Locomotion Level;Transfers;Sit;Sleep    Examination-Participation Restrictions  Community Activity;Other;Yard Work    [employer] officer  Evolving/Moderate complexity    Clinical Decision Making  Moderate    Clinical Presentation due to:  complex medical history with chronic pain    Rehab Potential  Good    Clinical Impairments Affecting Rehab Potential  acute on chronic nature of pain, presence of Rt THA and bilat TKA    PT Frequency  2x / week    PT Duration  4 weeks    PT Treatment/Interventions  Cryotherapy;Electrical Stimulation;Iontophoresis 4mg /ml  Dexamethasone;Moist Heat;Ultrasound;Gait training;Therapeutic exercise;Therapeutic activities;Neuromuscular re-education;Passive range of motion;Dry needling;Taping;Patient/family education    PT Next Visit Plan  core work in positions that are tolerable to him , he has home TENS unit and uses heat there       Patient will benefit from skilled therapeutic intervention in order to improve the following deficits and impairments:  Decreased activity tolerance, Decreased endurance, Decreased range of motion, Decreased strength, Difficulty walking, Increased muscle spasms, Impaired  flexibility, Hypomobility, Obesity, Pain, Postural dysfunction  Visit Diagnosis: Chronic right-sided low back pain without sciatica - Plan: PT plan of care cert/re-cert  Other symptoms and signs involving the musculoskeletal system - Plan: PT plan of care cert/re-cert     Problem List Patient Active Problem List   Diagnosis Date Noted  . Coronary artery disease involving native heart without angina pectoris 06/09/2018  . Essential hypertension 06/09/2018  . Dyslipidemia 06/09/2018  . Educated about COVID-19 virus infection 06/09/2018    Jeral Pinch PT  05/18/2019, 11:45 AM  [place], [place], [address] Phone: [phone]   Fax:  [phone]  Name: Carlos Moreno MRN: [id number] Date of Birth: [birth year]

## 2019-05-18 NOTE — Patient Instructions (Signed)
Access Code: [email or web address] URL: [email or web address] Date: 05/18/2019 Prepared by: Jeral Pinch  Exercises Seated March - 1 x daily - 1 sets - 10 reps - 5 hold Standing Back Extension - 1 x daily - 10 reps - 2 hold Standing Hip Extension at Wall - 1 x daily - 2-3 sets - 10 reps

## 2019-05-21 ENCOUNTER — Ambulatory Visit: Payer: Medicare Other | Admitting: Physical Therapy

## 2019-05-21 ENCOUNTER — Encounter: Payer: Self-pay | Admitting: Physical Therapy

## 2019-05-21 ENCOUNTER — Other Ambulatory Visit: Payer: Self-pay

## 2019-05-21 ENCOUNTER — Telehealth: Payer: Self-pay | Admitting: Cardiology

## 2019-05-21 DIAGNOSIS — M545 Low back pain: Secondary | ICD-10-CM | POA: Diagnosis not present

## 2019-05-21 DIAGNOSIS — M25551 Pain in right hip: Secondary | ICD-10-CM

## 2019-05-21 DIAGNOSIS — R29898 Other symptoms and signs involving the musculoskeletal system: Secondary | ICD-10-CM

## 2019-05-21 DIAGNOSIS — G8929 Other chronic pain: Secondary | ICD-10-CM

## 2019-05-21 DIAGNOSIS — R262 Difficulty in walking, not elsewhere classified: Secondary | ICD-10-CM

## 2019-05-21 NOTE — Therapy (Signed)
[place], [place], [address] Phone: [phone]   Fax:  [phone]  Physical Therapy Treatment  Patient Details  Name: Carlos Moreno MRN: [id number] Date of Birth: 24-Aug-1945 Referring Provider (PT): Dr Laroy Apple   Encounter Date: 05/21/2019  PT End of Session - 05/21/19 1019    Visit Number  2    Number of Visits  8    Date for PT Re-Evaluation  06/15/19    Authorization Type  MCR/AARP    Authorization Time Period  pnote 10th visist    PT Start Time  1019    PT Stop Time  1059    PT Time Calculation (min)  40 min    Activity Tolerance  Patient tolerated treatment well    Behavior During Therapy  [facility] for tasks assessed/performed       Past Medical History:  Diagnosis Date  . Anemia   . Basal cell carcinoma   . CAD (coronary artery disease)    Stent 2017.  Stent 2005 (Details of both stents pending)  . Cataract   . Curvature of spine   . DM (diabetes mellitus) ([place])   . Gallstones   . Hypertension   . Kidney stones   . Macular degeneration   . Obesity   . OSA (obstructive sleep apnea)    CPAP    Past Surgical History:  Procedure Laterality Date  . ABDOMINOPLASTY  02/2009  . ANGIOPLASTY  06/05 05/17   with Stents  . CATARACT EXTRACTION, BILATERAL  03/2007  . CHOLECYSTECTOMY  2001  . COLONOSCOPY  08/07 11/12 04/16   . HIP SURGERY Left   . INGUINAL HERNIA REPAIR Left 1956  . KNEE ARTHROSCOPY Right 05/2005   x3  . LAMINECTOMY  2009   Lumbar  . REPLACEMENT TOTAL KNEE Left 1994  . REPLACEMENT TOTAL KNEE Right    x 3  . 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 TOTAL HIP ARTHROPLASTY Right 01/2007    There were no vitals filed for this visit.  Subjective Assessment - 05/21/19 1019    Subjective  Pt reports he can feel the weather change in his joints,  Doing ok with HEP, does have trouble with the standing hip ext exercise    Pertinent History   PRF:FMBWGY,KZL,DJT,TSV,XB TKA 94, Rt TKA with 3 revisions last 5/18, lumbar laminectomy 2009.    Patient Stated Goals  pt not sure what PT can do to alievate the pain, MD wants to try this before having an injection    Currently in Pain?  Yes    Pain Location  Back    Pain Orientation  Lower    Pain Descriptors / Indicators  Sharp    Pain Type  Chronic pain                       [facility] Adult PT Treatment/Exercise - 05/21/19 0001      Exercises   Exercises  Lumbar      Lumbar Exercises: Stretches   Passive Hamstring Stretch  Left;Right;20 seconds   supine   Single Knee to Chest Stretch  Left;Right;20 seconds      Lumbar Exercises: Standing   Row  Strengthening;Both;15 reps;Theraband    Theraband Level (Row)  Level 3 (Green)   VC for form   Shoulder Extension  Strengthening;Both;15 reps;Theraband    Theraband Level (Shoulder Extension)  Level 3 (Green)    Other Standing Lumbar Exercises  15 reps upper trunk rotations with green band      Lumbar Exercises: Seated   Other Seated Lumbar Exercises  on blue pad, opposite arm and leg lifts with 3 sec holds, then 2x10 FWD reach with 7# 3 sec holds      Lumbar Exercises: Supine   Isometric Hip Flexion  15 reps;5 seconds   each side     Knee/Hip Exercises: Aerobic   Nustep  L5x7'                  PT Long Term Goals - 05/18/19 1016      PT LONG TERM GOAL #1   Title  Pt will be I and compliant with HEP to include a formal walking program ( 06/15/2019)    Time  4    Period  Weeks    Status  New    Target Date  06/15/19      PT LONG TERM GOAL #2   Title  perform bed mobility and transfers with minimal to no pain in his back ( 06/15/2019)    Time  4    Period  Weeks    Status  New    Target Date  06/15/19      PT LONG TERM GOAL #3   Title  pt will report =/> 50% improvement in his sleeping ( 06/15/2019)    Time  4    Period  Weeks    Status  New    Target Date  06/15/19      PT LONG TERM GOAL #4    Title  improve FOTO =/< 43% limited ( 06/15/2019)    Time  4    Period  Weeks    Status  New    Target Date  06/15/19            Plan - 05/21/19 1059    Clinical Impression Statement  Carlos Moreno reported some increased Rt knee pain d/t the weather changes,  Reviewd current HEP, no progression made today.  He was able to perform exercise in clinic with some fatigue and increase in pain.  He will use his home TENs if needed later today.    Rehab Potential  Good    PT Frequency  2x / week    PT Duration  4 weeks    PT Treatment/Interventions  Cryotherapy;Electrical Stimulation;Iontophoresis 4mg /ml Dexamethasone;Moist Heat;Ultrasound;Gait training;Therapeutic exercise;Therapeutic activities;Neuromuscular re-education;Passive range of motion;Dry needling;Taping;Patient/family education    PT Next Visit Plan  core work in positions that are tolerable to him , he has home TENS unit and uses heat there    Consulted and Agree with Plan of Care  Patient       Patient will benefit from skilled therapeutic intervention in order to improve the following deficits and impairments:  Decreased activity tolerance, Decreased endurance, Decreased range of motion, Decreased strength, Difficulty walking, Increased muscle spasms, Impaired flexibility, Hypomobility, Obesity, Pain, Postural dysfunction  Visit Diagnosis: Chronic right-sided low back pain without sciatica  Other symptoms and signs involving the musculoskeletal system  Difficulty in walking, not elsewhere classified  Pain in right hip     Problem List Patient Active Problem List   Diagnosis Date Noted  . Coronary artery disease involving native heart without angina pectoris 06/09/2018  . Essential hypertension 06/09/2018  . Dyslipidemia 06/09/2018  . Educated about COVID-19 virus infection 06/09/2018    Jeral Pinch PT  05/21/2019, 11:01 AM  [facility] [place]  [place], [place], [address] Phone: [phone]   Fax:  [phone]  Name: Carlos Moreno MRN: [id number] Date of Birth: Mar 01, 1945

## 2019-05-21 NOTE — Telephone Encounter (Signed)
LVM RE: F/U Visit-- AF 

## 2019-05-24 NOTE — Progress Notes (Signed)
Cardiology Office Note   Date:  05/25/2019   ID:  Carlos Moreno, DOB [birth year], MRN [id number]  PCP:  Crist Infante, MD  Cardiologist:   Minus Breeding, MD   No chief complaint on file.     History of Present Illness: Carlos Moreno is a 74 y.o. male who presents for follow up of CAD.  He had distant stenting in 2005.  He also had more recent stenting in 2007.  His prior cardiac treatment was in [place].  Of note his last catheterization was 2017.  Since I last saw him he has had significant problems with back pain.  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  He still does some walking around the big box stores and he does his chores.  He has not had any symptoms related to this.  He denies any chest discomfort, neck or arm discomfort.  He has had no palpitations, presyncope or syncope.  He has had no PND or orthopnea.  Past Medical History:  Diagnosis Date  . Anemia   . Basal cell carcinoma   . CAD (coronary artery disease)    Stent 2017.  Stent 2005 (Details of both stents pending)  . Cataract   . Curvature of spine   . DM (diabetes mellitus) ([place])   . Gallstones   . Hypertension   . Kidney stones   . Macular degeneration   . Obesity   . OSA (obstructive sleep apnea)    CPAP    Past Surgical History:  Procedure Laterality Date  . ABDOMINOPLASTY  02/2009  . ANGIOPLASTY  06/05 05/17   with Stents  . CATARACT EXTRACTION, BILATERAL  03/2007  . CHOLECYSTECTOMY  2001  . COLONOSCOPY  08/07 11/12 04/16   . HIP SURGERY Left   . INGUINAL HERNIA REPAIR Left 1956  . KNEE ARTHROSCOPY Right 05/2005   x3  . LAMINECTOMY  2009   Lumbar  . REPLACEMENT TOTAL KNEE Left 1994  . REPLACEMENT TOTAL KNEE Right    x 3  . 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 TOTAL HIP ARTHROPLASTY Right 01/2007     Current Outpatient Medications  Medication Sig Dispense Refill  . Aspirin Buf,CaCarb-MgCarb-MgO, 81 MG TABS Adult Low Dose Aspirin 81 mg tablet   1 tablet every  day by oral route.    Marland Kitchen atorvastatin (LIPITOR) 40 MG tablet Take 40 mg by mouth daily.  3  . b complex vitamins capsule Take 1 capsule by mouth as directed.    Marland Kitchen BYSTOLIC 5 MG tablet Take 5 mg by mouth daily.  3  . calcium citrate (CALCITRATE - DOSED IN MG ELEMENTAL CALCIUM) 950 (200 Ca) MG tablet Take by mouth.    . calcium gluconate 500 MG tablet Take 1 tablet by mouth daily.    . calcium-vitamin D (OSCAL WITH D) 500-200 MG-UNIT TABS tablet Take by mouth.    . cetirizine (ZYRTEC) 10 MG tablet Take 10 mg by mouth daily.    . Cyanocobalamin (B-12) 5000 MCG SUBL Place 1 tablet under the tongue as directed.    . Diphenhydramine-APAP, sleep, (TYLENOL PM EXTRA STRENGTH PO) Take 1 tablet by mouth daily.    . isosorbide mononitrate (IMDUR) 30 MG 24 hr tablet Take 30 mg by mouth daily.  3  . JARDIANCE 25 MG TABS tablet Take 1 tablet by mouth daily.    Marland Kitchen losartan (COZAAR) 100 MG tablet Take 100 mg by mouth daily.    . Multiple Vitamin (CHEW-12) CHEW Chew  by mouth.    . Multiple Vitamin (MULTIVITAMIN) tablet Take 1 tablet by mouth daily.    . Multiple Vitamins-Minerals (VISION FORMULA/LUTEIN) TABS Take by mouth.    . nitroGLYCERIN (NITROSTAT) 0.4 MG SL tablet Place 1 tablet (0.4 mg total) under the tongue every 5 (five) minutes as needed for chest pain. 25 tablet 3  . pantoprazole (PROTONIX) 40 MG tablet Take 1 tablet by mouth daily.    . Polysaccharide Iron Complex (IRON UP PO) Take 120 mg by mouth daily.    Marland Kitchen tiZANidine (ZANAFLEX) 4 MG tablet Take 4 mg by mouth at bedtime.    . TRADJENTA 5 MG TABS tablet Take 1 tablet by mouth daily.    . traMADol (ULTRAM) 50 MG tablet Take 50 mg by mouth every 6 (six) hours as needed.    . vitamin C (ASCORBIC ACID) 500 MG tablet Take 500 mg by mouth daily.    Marland Kitchen zinc gluconate 50 MG tablet Take by mouth.     No current facility-administered medications for this visit.    Allergies:   Patient has no known allergies.    ROS:  Please see the history of present  illness.   Otherwise, review of systems are positive for none.   All other systems are reviewed and negative.    PHYSICAL EXAM: VS:  BP 140/72   Pulse (!) 53   Temp (!) 96.4 F (35.8 C)   Ht 5' 9.5" (1.765 m)   Wt 281 lb 12.8 oz (127.8 kg)   SpO2 97%   BMI 41.02 kg/m  , BMI Body mass index is 41.02 kg/m. GENERAL:  Well appearing NECK:  No jugular venous distention, waveform within normal limits, carotid upstroke brisk and symmetric, no bruits, no thyromegaly LUNGS:  Clear to auscultation bilaterally CHEST:  None  HEART:  PMI not displaced or sustained,S1 and S2 within normal limits, no S3, no S4, no clicks, no rubs, very soft apical systolic murmur nonradiating, no diastolic murmurs ABD:  Flat, positive bowel sounds normal in frequency in pitch, no bruits, no rebound, no guarding, no midline pulsatile mass, no hepatomegaly, no splenomegaly EXT:  2 plus pulses throughout, mild leg edema, no cyanosis no clubbing, chronic venous stasis changes   EKG:  EKG is ordered today. The ekg ordered today demonstrates sinus rhythm, rate 51, axis within normal limits, intervals within normal limits, no acute ST-T wave changes.   Recent Labs: 04/12/2019: BUN 34; Creatinine, Ser 1.90; Potassium 4.8; Sodium 142    Lipid Panel No results found for: CHOL, TRIG, HDL, CHOLHDL, VLDL, LDLCALC, LDLDIRECT    Wt Readings from Last 3 Encounters:  05/25/19 281 lb 12.8 oz (127.8 kg)  05/03/19 264 lb (119.7 kg)  04/12/19 264 lb 6 oz (119.9 kg)      Other studies Reviewed: Additional studies/ records that were reviewed today include: Labs. Review of the above records demonstrates:  Please see elsewhere in the note.     ASSESSMENT AND PLAN:  CAD: The patient has had no new cardiovascular symptoms.  He will continue with aggressive risk reduction.  No further testing is indicated.   HTN: His BP is  slightly elevated today and he brings an extensive blood pressure diary.  His blood pressures are  typically in the 130/80 range or lower although it does fluctuate.  No change in therapy.   OBESITY:  He continues to work on this.    SLEEP APNEA: He wears CPAP.  DYSLIPIDEMIA:LDL was excellent.  This was 68  with an HDL of 61 in January.  No change in therapy.   COVID EDUCATION: He has been vaccinated.  He did see   The following changes have been made:  no change  Labs/ tests ordered today include:  No orders of the defined types were placed in this encounter.    Disposition:   FU with me in one year.     Signed, Minus Breeding, MD  05/25/2019 3:34 PM    [facility]

## 2019-05-25 ENCOUNTER — Encounter: Payer: Self-pay | Admitting: Cardiology

## 2019-05-25 ENCOUNTER — Ambulatory Visit: Payer: Medicare Other | Admitting: Cardiology

## 2019-05-25 ENCOUNTER — Encounter: Payer: Self-pay | Admitting: Physical Therapy

## 2019-05-25 ENCOUNTER — Other Ambulatory Visit: Payer: Self-pay

## 2019-05-25 ENCOUNTER — Ambulatory Visit: Payer: Medicare Other | Admitting: Physical Therapy

## 2019-05-25 VITALS — BP 140/72 | HR 53 | Temp 96.4°F | Ht 69.5 in | Wt 281.8 lb

## 2019-05-25 DIAGNOSIS — I10 Essential (primary) hypertension: Secondary | ICD-10-CM | POA: Diagnosis not present

## 2019-05-25 DIAGNOSIS — E785 Hyperlipidemia, unspecified: Secondary | ICD-10-CM

## 2019-05-25 DIAGNOSIS — Z7189 Other specified counseling: Secondary | ICD-10-CM

## 2019-05-25 DIAGNOSIS — R29898 Other symptoms and signs involving the musculoskeletal system: Secondary | ICD-10-CM

## 2019-05-25 DIAGNOSIS — M545 Low back pain: Secondary | ICD-10-CM | POA: Diagnosis not present

## 2019-05-25 DIAGNOSIS — G8929 Other chronic pain: Secondary | ICD-10-CM

## 2019-05-25 DIAGNOSIS — I2510 Atherosclerotic heart disease of native coronary artery without angina pectoris: Secondary | ICD-10-CM

## 2019-05-25 MED ORDER — NITROGLYCERIN 0.4 MG SL SUBL: 0.4000 mg | SUBLINGUAL_TABLET | SUBLINGUAL | 3 refills | Status: DC | PRN

## 2019-05-25 NOTE — Therapy (Signed)
[place], [place], [address] Phone: [phone]   Fax:  [phone]  Physical Therapy Treatment  Patient Details  Name: Carlos Moreno MRN: [id number] Date of Birth: 1945-07-04 Referring Provider (PT): Dr Laroy Apple   Encounter Date: 05/25/2019  PT End of Session - 05/25/19 1008    Visit Number  3    Date for PT Re-Evaluation  06/15/19    Authorization Type  MCR/AARP    Authorization Time Period  pnote 10th visist    PT Start Time  0925    PT Stop Time  1008    PT Time Calculation (min)  43 min    Activity Tolerance  Patient tolerated treatment well    Behavior During Therapy  [facility] for tasks assessed/performed       Past Medical History:  Diagnosis Date  . Anemia   . Basal cell carcinoma   . CAD (coronary artery disease)    Stent 2017.  Stent 2005 (Details of both stents pending)  . Cataract   . Curvature of spine   . DM (diabetes mellitus) ([place])   . Gallstones   . Hypertension   . Kidney stones   . Macular degeneration   . Obesity   . OSA (obstructive sleep apnea)    CPAP    Past Surgical History:  Procedure Laterality Date  . ABDOMINOPLASTY  02/2009  . ANGIOPLASTY  06/05 05/17   with Stents  . CATARACT EXTRACTION, BILATERAL  03/2007  . CHOLECYSTECTOMY  2001  . COLONOSCOPY  08/07 11/12 04/16   . HIP SURGERY Left   . INGUINAL HERNIA REPAIR Left 1956  . KNEE ARTHROSCOPY Right 05/2005   x3  . LAMINECTOMY  2009   Lumbar  . REPLACEMENT TOTAL KNEE Left 1994  . REPLACEMENT TOTAL KNEE Right    x 3  . 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 TOTAL HIP ARTHROPLASTY Right 01/2007    There were no vitals filed for this visit.  Subjective Assessment - 05/25/19 0926    Subjective  Pt reports no change overall.    Pertinent History  QZR:AQTMAU,QJF,HLK,TGY,BW TKA 94, Rt TKA with 3 revisions last 5/18, lumbar laminectomy 2009.    Limitations  Lifting;Standing;Walking    Currently in Pain?  Yes    Pain Score  7     Pain Location  Back    Pain Orientation  Lower                       [facility] Adult PT Treatment/Exercise - 05/25/19 0001      Lumbar Exercises: Aerobic   UBE (Upper Arm Bike)  L2 3 min each way      Lumbar Exercises: Machines for Strengthening   Cybex Lumbar Extension  black band 3x15    Other Lumbar Machine Exercise  Rows & Lats 35lb 2x10      Lumbar Exercises: Standing   Row  Theraband;20 reps;Both;Strengthening    Theraband Level (Row)  Level 3 (Green)    Shoulder Extension  Strengthening;Both;Theraband;20 reps    Theraband Level (Shoulder Extension)  Level 3 (Green)    Other Standing Lumbar Exercises  min squats with pball    Other Standing Lumbar Exercises  Straight arm pull downs 35lb 2x15      Knee/Hip Exercises: Machines for Strengthening   Cybex Knee Extension  15lb2x15    Cybex Knee Flexion  35lb 2x15  PT Long Term Goals - 05/18/19 1016      PT LONG TERM GOAL #1   Title  Pt will be I and compliant with HEP to include a formal walking program ( 06/15/2019)    Time  4    Period  Weeks    Status  New    Target Date  06/15/19      PT LONG TERM GOAL #2   Title  perform bed mobility and transfers with minimal to no pain in his back ( 06/15/2019)    Time  4    Period  Weeks    Status  New    Target Date  06/15/19      PT LONG TERM GOAL #3   Title  pt will report =/> 50% improvement in his sleeping ( 06/15/2019)    Time  4    Period  Weeks    Status  New    Target Date  06/15/19      PT LONG TERM GOAL #4   Title  improve FOTO =/< 43% limited ( 06/15/2019)    Time  4    Period  Weeks    Status  New    Target Date  06/15/19            Plan - 05/25/19 1009    Clinical Impression Statement  Pt did well with a progressed treatment session. Cues throughout needed to engage core and to maintain good posture. No issues with machine level interventions. He did ask for increase  weight on several occasions. He revealed he went to the gym with a trainer pre covid. He has not returned to the gym, pain started 4 months ago.    Personal Factors and Comorbidities  Comorbidity 3+;Past/Current Experience    Comorbidities  refer to snap shot    Examination-Activity Limitations  Bed Mobility;Locomotion Level;Transfers;Sit;Sleep    Examination-Participation Restrictions  Community Activity;Other;Yard Work    Stability/Clinical Decision Making  Evolving/Moderate complexity    Rehab Potential  Good    Clinical Impairments Affecting Rehab Potential  acute on chronic nature of pain, presence of Rt THA and bilat TKA    PT Frequency  2x / week    PT Duration  4 weeks    PT Treatment/Interventions  Cryotherapy;Electrical Stimulation;Iontophoresis 4mg /ml Dexamethasone;Moist Heat;Ultrasound;Gait training;Therapeutic exercise;Therapeutic activities;Neuromuscular re-education;Passive range of motion;Dry needling;Taping;Patient/family education    PT Next Visit Plan  core work in positions that are tolerable to him , he has home TENS unit and uses heat there       Patient will benefit from skilled therapeutic intervention in order to improve the following deficits and impairments:  Decreased activity tolerance, Decreased endurance, Decreased range of motion, Decreased strength, Difficulty walking, Increased muscle spasms, Impaired flexibility, Hypomobility, Obesity, Pain, Postural dysfunction  Visit Diagnosis: Chronic right-sided low back pain without sciatica  Other symptoms and signs involving the musculoskeletal system     Problem List Patient Active Problem List   Diagnosis Date Noted  . Coronary artery disease involving native heart without angina pectoris 06/09/2018  . Essential hypertension 06/09/2018  . Dyslipidemia 06/09/2018  . Educated about COVID-19 virus infection 06/09/2018    Scot Jun, PTA 05/25/2019, 10:11 AM  [place], [place], [address] Phone: [phone]   Fax:  [phone]  Name: Kamel Haven MRN: [id number] Date of Birth: 1945/12/29

## 2019-05-25 NOTE — Patient Instructions (Addendum)
Medication Instructions:  SUBLINGUAL NITROGLYCERIN CALLED IN *If you need a refill on your cardiac medications before your next appointment, please call your pharmacy*  Lab Work: NONE ORDERED THIS VISIT  Testing/Procedures: NONE ORDERED THIS VISIT  Follow-Up: At [facility], you and your health needs are our priority.  As part of our continuing mission to provide you with exceptional heart care, we have created designated Provider Care Teams.  These Care Teams include your primary Cardiologist (physician) and Advanced Practice Providers (APPs -  Physician Assistants and Nurse Practitioners) who all work together to provide you with the care you need, when you need it.  Your next appointment:   12 month(s)  You will receive a reminder letter in the mail two months in advance. If you don't receive a letter, please call our office to schedule the follow-up appointment.  The format for your next appointment:   In Person  Provider:   Minus Breeding, MD

## 2019-05-28 ENCOUNTER — Ambulatory Visit: Payer: Medicare Other | Admitting: Physical Therapy

## 2019-05-28 ENCOUNTER — Other Ambulatory Visit: Payer: Self-pay

## 2019-05-28 ENCOUNTER — Encounter: Payer: Self-pay | Admitting: Physical Therapy

## 2019-05-28 DIAGNOSIS — R29898 Other symptoms and signs involving the musculoskeletal system: Secondary | ICD-10-CM

## 2019-05-28 DIAGNOSIS — M25551 Pain in right hip: Secondary | ICD-10-CM

## 2019-05-28 DIAGNOSIS — G8929 Other chronic pain: Secondary | ICD-10-CM

## 2019-05-28 DIAGNOSIS — R262 Difficulty in walking, not elsewhere classified: Secondary | ICD-10-CM

## 2019-05-28 DIAGNOSIS — M545 Low back pain: Secondary | ICD-10-CM | POA: Diagnosis not present

## 2019-05-28 NOTE — Therapy (Signed)
[place], [place], [address] Phone: [phone]   Fax:  [phone]  Physical Therapy Treatment  Patient Details  Name: Carlos Moreno MRN: [id number] Date of Birth: 10-May-1945 Referring Provider (PT): Dr Laroy Apple   Encounter Date: 05/28/2019  PT End of Session - 05/28/19 1101    Visit Number  4    Number of Visits  8    Date for PT Re-Evaluation  06/15/19    Authorization Time Period  pnote 10th visist    PT Start Time  1015    PT Stop Time  1100    PT Time Calculation (min)  45 min    Activity Tolerance  Patient tolerated treatment well    Behavior During Therapy  [facility] for tasks assessed/performed       Past Medical History:  Diagnosis Date  . Anemia   . Basal cell carcinoma   . CAD (coronary artery disease)    Stent 2017.  Stent 2005 (Details of both stents pending)  . Cataract   . Curvature of spine   . DM (diabetes mellitus) ([place])   . Gallstones   . Hypertension   . Kidney stones   . Macular degeneration   . Obesity   . OSA (obstructive sleep apnea)    CPAP    Past Surgical History:  Procedure Laterality Date  . ABDOMINOPLASTY  02/2009  . ANGIOPLASTY  06/05 05/17   with Stents  . CATARACT EXTRACTION, BILATERAL  03/2007  . CHOLECYSTECTOMY  2001  . COLONOSCOPY  08/07 11/12 04/16   . HIP SURGERY Left   . INGUINAL HERNIA REPAIR Left 1956  . KNEE ARTHROSCOPY Right 05/2005   x3  . LAMINECTOMY  2009   Lumbar  . REPLACEMENT TOTAL KNEE Left 1994  . REPLACEMENT TOTAL KNEE Right    x 3  . 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 TOTAL HIP ARTHROPLASTY Right 01/2007    There were no vitals filed for this visit.  Subjective Assessment - 05/28/19 1017    Subjective  "I am here" "A little hitch in the L hip"    Pertinent History  EZM:OQHUTM,LYY,TKP,TWS,FK TKA 94, Rt TKA with 3 revisions last 5/18, lumbar laminectomy 2009.    Limitations  Lifting;Standing;Walking     Currently in Pain?  Yes    Pain Score  7     Pain Location  Back    Pain Orientation  Left;Right                       [facility] Adult PT Treatment/Exercise - 05/28/19 0001      Lumbar Exercises: Aerobic   UBE (Upper Arm Bike)  L3.5 3 min each way    Nustep  L4 x 4 min      Lumbar Exercises: Machines for Strengthening   Cybex Lumbar Extension  black band 3x15    Leg Press  40lb 2x15    Other Lumbar Machine Exercise  Rows & Lats 35lb 2x10      Lumbar Exercises: Standing   Other Standing Lumbar Exercises  Standing overhead Ext green ball 2x10      Lumbar Exercises: Seated   Other Seated Lumbar Exercises  ab sets pball 2x10      Knee/Hip Exercises: Standing   Walking with Sports Cord  20lb side step x5 each     Other Standing Knee Exercises  4 inch step ups x 10 each  PT Long Term Goals - 05/18/19 1016      PT LONG TERM GOAL #1   Title  Pt will be I and compliant with HEP to include a formal walking program ( 06/15/2019)    Time  4    Period  Weeks    Status  New    Target Date  06/15/19      PT LONG TERM GOAL #2   Title  perform bed mobility and transfers with minimal to no pain in his back ( 06/15/2019)    Time  4    Period  Weeks    Status  New    Target Date  06/15/19      PT LONG TERM GOAL #3   Title  pt will report =/> 50% improvement in his sleeping ( 06/15/2019)    Time  4    Period  Weeks    Status  New    Target Date  06/15/19      PT LONG TERM GOAL #4   Title  improve FOTO =/< 43% limited ( 06/15/2019)    Time  4    Period  Weeks    Status  New    Target Date  06/15/19            Plan - 05/28/19 1102    Clinical Impression Statement  Pt was able to complete all of the activities. Added resisted side step without issue. 4 inch step up did pose a challenge due to reported weakness. Seated ab sets did cause pain, with instructed pt to stop on the second set he stressed that's he wanted to complete the exercise.  Tactile cues given to lower back to emphasize lumbar ext with over head reaches.    Personal Factors and Comorbidities  Comorbidity 3+;Past/Current Experience    Examination-Activity Limitations  Bed Mobility;Locomotion Level;Transfers;Sit;Sleep    Examination-Participation Restrictions  Community Activity;Other;Yard Work    Stability/Clinical Decision Making  Evolving/Moderate complexity    Clinical Impairments Affecting Rehab Potential  acute on chronic nature of pain, presence of Rt THA and bilat TKA    PT Frequency  2x / week    PT Duration  4 weeks    PT Treatment/Interventions  Cryotherapy;Electrical Stimulation;Iontophoresis 4mg /ml Dexamethasone;Moist Heat;Ultrasound;Gait training;Therapeutic exercise;Therapeutic activities;Neuromuscular re-education;Passive range of motion;Dry needling;Taping;Patient/family education    PT Next Visit Plan  core work in positions that are tolerable to him , he has home TENS unit and uses heat there       Patient will benefit from skilled therapeutic intervention in order to improve the following deficits and impairments:  Decreased activity tolerance, Decreased endurance, Decreased range of motion, Decreased strength, Difficulty walking, Increased muscle spasms, Impaired flexibility, Hypomobility, Obesity, Pain, Postural dysfunction  Visit Diagnosis: Pain in right hip  Difficulty in walking, not elsewhere classified  Other symptoms and signs involving the musculoskeletal system  Chronic right-sided low back pain without sciatica     Problem List Patient Active Problem List   Diagnosis Date Noted  . Coronary artery disease involving native heart without angina pectoris 06/09/2018  . Essential hypertension 06/09/2018  . Dyslipidemia 06/09/2018  . Educated about COVID-19 virus infection 06/09/2018    Scot Jun, PTA 05/28/2019, 11:06 AM  [place]  [place], [place], [address] Phone: [phone]   Fax:  [phone]  Name: Carlos Moreno MRN: [id number] Date of Birth: 1945-07-04

## 2019-06-01 ENCOUNTER — Other Ambulatory Visit: Payer: Self-pay

## 2019-06-01 ENCOUNTER — Ambulatory Visit: Payer: Medicare Other | Admitting: Physical Therapy

## 2019-06-01 DIAGNOSIS — M25551 Pain in right hip: Secondary | ICD-10-CM

## 2019-06-01 DIAGNOSIS — R262 Difficulty in walking, not elsewhere classified: Secondary | ICD-10-CM

## 2019-06-01 DIAGNOSIS — R29898 Other symptoms and signs involving the musculoskeletal system: Secondary | ICD-10-CM

## 2019-06-01 DIAGNOSIS — M545 Low back pain: Secondary | ICD-10-CM | POA: Diagnosis not present

## 2019-06-01 NOTE — Therapy (Signed)
[place], [place], [address] Phone: [phone]   Fax:  [phone]  Physical Therapy Treatment  Patient Details  Name: Carlos Moreno MRN: [id number] Date of Birth: 03/30/1945 Referring Provider (PT): Dr Laroy Apple   Encounter Date: 06/01/2019  PT End of Session - 06/01/19 1001    Visit Number  5    Date for PT Re-Evaluation  06/15/19    Authorization Type  MCR/AARP    Authorization Time Period  pnote 10th visist    PT Start Time  0930    PT Stop Time  1002    PT Time Calculation (min)  32 min    Activity Tolerance  Patient tolerated treatment well    Behavior During Therapy  [facility] for tasks assessed/performed       Past Medical History:  Diagnosis Date  . Anemia   . Basal cell carcinoma   . CAD (coronary artery disease)    Stent 2017.  Stent 2005 (Details of both stents pending)  . Cataract   . Curvature of spine   . DM (diabetes mellitus) ([place])   . Gallstones   . Hypertension   . Kidney stones   . Macular degeneration   . Obesity   . OSA (obstructive sleep apnea)    CPAP    Past Surgical History:  Procedure Laterality Date  . ABDOMINOPLASTY  02/2009  . ANGIOPLASTY  06/05 05/17   with Stents  . CATARACT EXTRACTION, BILATERAL  03/2007  . CHOLECYSTECTOMY  2001  . COLONOSCOPY  08/07 11/12 04/16   . HIP SURGERY Left   . INGUINAL HERNIA REPAIR Left 1956  . KNEE ARTHROSCOPY Right 05/2005   x3  . LAMINECTOMY  2009   Lumbar  . REPLACEMENT TOTAL KNEE Left 1994  . REPLACEMENT TOTAL KNEE Right    x 3  . 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 TOTAL HIP ARTHROPLASTY Right 01/2007    There were no vitals filed for this visit.  Subjective Assessment - 06/01/19 0933    Subjective  "no much better" "Picked up a catch in her L hip about 4 days ago" Feels like th legs are getting stronger bu back pain is the same.    Pertinent History  ZDG:UYQIHK,VQQ,VZD,GLO,VF TKA 94, Rt TKA  with 3 revisions last 5/18, lumbar laminectomy 2009.    Limitations  Lifting;Standing;Walking    Currently in Pain?  Yes    Pain Score  6     Pain Location  Back                       [facility] Adult PT Treatment/Exercise - 06/01/19 0001      Lumbar Exercises: Stretches   Passive Hamstring Stretch  Right;Left;4 reps;10 seconds;20 seconds    Single Knee to Chest Stretch  Right;Left;4 reps;10 seconds    Other Lumbar Stretch Exercise  trunk rotations x5 each 10 sec      Lumbar Exercises: Aerobic   UBE (Upper Arm Bike)  L3.5 2 min each way    Nustep  L4 x 6 min      Lumbar Exercises: Supine   Bridge  Compliant;10 reps;2 seconds                  PT Long Term Goals - 06/01/19 1005      PT LONG TERM GOAL #1   Title  Pt will be I and compliant with HEP to include  a formal walking program ( 06/15/2019)    Status  On-going      PT LONG TERM GOAL #2   Title  perform bed mobility and transfers with minimal to no pain in his back ( 06/15/2019)    Status  On-going            Plan - 06/01/19 1002    Clinical Impression Statement  No progress towards goals. Pt enters clinic reporting no change in his back pain. He stated he feels stronger but back pain pres ist. Today's session focused on stretching. He is very tight in the HS, lower trunk rotations to Pt's R caused pain on L side. Advised pt to call MD soon    Personal Factors and Comorbidities  Comorbidity 3+;Past/Current Experience    Examination-Activity Limitations  Bed Mobility;Locomotion Level;Transfers;Sit;Sleep    Examination-Participation Restrictions  Community Activity;Other;Yard Work    Stability/Clinical Decision Making  Evolving/Moderate complexity    Rehab Potential  Good    Clinical Impairments Affecting Rehab Potential  acute on chronic nature of pain, presence of Rt THA and bilat TKA    PT Frequency  2x / week    PT Duration  4 weeks    PT Treatment/Interventions  Cryotherapy;Electrical  Stimulation;Iontophoresis 4mg /ml Dexamethasone;Moist Heat;Ultrasound;Gait training;Therapeutic exercise;Therapeutic activities;Neuromuscular re-education;Passive range of motion;Dry needling;Taping;Patient/family education    PT Next Visit Plan  core work in positions that are tolerable to him , he has home TENS unit and uses heat there, advise earlier MD appointment       Patient will benefit from skilled therapeutic intervention in order to improve the following deficits and impairments:  Decreased activity tolerance, Decreased endurance, Decreased range of motion, Decreased strength, Difficulty walking, Increased muscle spasms, Impaired flexibility, Hypomobility, Obesity, Pain, Postural dysfunction  Visit Diagnosis: Difficulty in walking, not elsewhere classified  Pain in right hip  Other symptoms and signs involving the musculoskeletal system     Problem List Patient Active Problem List   Diagnosis Date Noted  . Coronary artery disease involving native heart without angina pectoris 06/09/2018  . Essential hypertension 06/09/2018  . Dyslipidemia 06/09/2018  . Educated about COVID-19 virus infection 06/09/2018    Scot Jun 06/01/2019, 10:05 AM  [place], [place], [address] Phone: [phone]   Fax:  [phone]  Name: Carlos Moreno MRN: [id number] Date of Birth: [birth year]

## 2019-06-03 ENCOUNTER — Ambulatory Visit: Payer: Medicare Other | Admitting: Physical Therapy

## 2019-06-08 ENCOUNTER — Other Ambulatory Visit: Payer: Self-pay

## 2019-06-08 ENCOUNTER — Encounter: Payer: Self-pay | Admitting: Physical Therapy

## 2019-06-08 ENCOUNTER — Ambulatory Visit: Payer: Medicare Other | Admitting: Physical Therapy

## 2019-06-08 DIAGNOSIS — R29898 Other symptoms and signs involving the musculoskeletal system: Secondary | ICD-10-CM

## 2019-06-08 DIAGNOSIS — G8929 Other chronic pain: Secondary | ICD-10-CM

## 2019-06-08 DIAGNOSIS — M545 Low back pain: Secondary | ICD-10-CM | POA: Diagnosis not present

## 2019-06-08 DIAGNOSIS — R262 Difficulty in walking, not elsewhere classified: Secondary | ICD-10-CM

## 2019-06-08 DIAGNOSIS — M25551 Pain in right hip: Secondary | ICD-10-CM

## 2019-06-08 NOTE — Therapy (Signed)
[place], [place], [address] Phone: [phone]   Fax:  [phone]  Physical Therapy Treatment  Patient Details  Name: Carlos Moreno MRN: [id number] Date of Birth: Jan 10, 1946 Referring Provider (PT): Dr Laroy Apple   Encounter Date: 06/08/2019  PT End of Session - 06/08/19 0915    Visit Number  6    Date for PT Re-Evaluation  06/15/19    Authorization Type  MCR/AARP    Authorization Time Period  pnote 10th visist    PT Start Time  0840    PT Stop Time  0915    PT Time Calculation (min)  35 min       Past Medical History:  Diagnosis Date  . Anemia   . Basal cell carcinoma   . CAD (coronary artery disease)    Stent 2017.  Stent 2005 (Details of both stents pending)  . Cataract   . Curvature of spine   . DM (diabetes mellitus) ([place])   . Gallstones   . Hypertension   . Kidney stones   . Macular degeneration   . Obesity   . OSA (obstructive sleep apnea)    CPAP    Past Surgical History:  Procedure Laterality Date  . ABDOMINOPLASTY  02/2009  . ANGIOPLASTY  06/05 05/17   with Stents  . CATARACT EXTRACTION, BILATERAL  03/2007  . CHOLECYSTECTOMY  2001  . COLONOSCOPY  08/07 11/12 04/16   . HIP SURGERY Left   . INGUINAL HERNIA REPAIR Left 1956  . KNEE ARTHROSCOPY Right 05/2005   x3  . LAMINECTOMY  2009   Lumbar  . REPLACEMENT TOTAL KNEE Left 1994  . REPLACEMENT TOTAL KNEE Right    x 3  . 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 TOTAL HIP ARTHROPLASTY Right 01/2007    There were no vitals filed for this visit.  Subjective Assessment - 06/08/19 0842    Subjective  "I am better today" Pt reports that's he feels better today, but did not know why. Drove 8 hours yesterday with heated seated on and off.    Pertinent History  TML:YYTKPT,WSF,KCL,EXN,TZ TKA 94, Rt TKA with 3 revisions last 5/18, lumbar laminectomy 2009.    Currently in Pain?  Yes    Pain Score  4     Pain  Location  Back                       [facility] Adult PT Treatment/Exercise - 06/08/19 0001      Lumbar Exercises: Stretches   Passive Hamstring Stretch  Right;Left;4 reps;10 seconds;20 seconds    Single Knee to Chest Stretch  Right;Left;4 reps;10 seconds    Piriformis Stretch  Left;Right;3 reps;20 seconds    Other Lumbar Stretch Exercise  trunk rotations x5 each 10 sec      Lumbar Exercises: Aerobic   UBE (Upper Arm Bike)  L4 2 min each way    Nustep  L4 x 6 min      Lumbar Exercises: Machines for Strengthening   Cybex Lumbar Extension  black band 3x15      Lumbar Exercises: Supine   Bridge  Compliant;10 reps;2 seconds                  PT Long Term Goals - 06/01/19 1005      PT LONG TERM GOAL #1   Title  Pt will be I and compliant with HEP to include a  formal walking program ( 06/15/2019)    Status  On-going      PT LONG TERM GOAL #2   Title  perform bed mobility and transfers with minimal to no pain in his back ( 06/15/2019)    Status  On-going            Plan - 06/08/19 0916    Clinical Impression Statement  Improvement reported today's but unsure why. Added seated lumbar extensions to the session today without issue. He remains very tight in the hips and with trunk rotation.    Personal Factors and Comorbidities  Comorbidity 3+;Past/Current Experience    Comorbidities  refer to snap shot    Examination-Activity Limitations  Bed Mobility;Locomotion Level;Transfers;Sit;Sleep    Examination-Participation Restrictions  Community Activity;Other;Yard Work    Stability/Clinical Decision Making  Evolving/Moderate complexity    Clinical Impairments Affecting Rehab Potential  acute on chronic nature of pain, presence of Rt THA and bilat TKA    PT Frequency  2x / week    PT Duration  4 weeks    PT Treatment/Interventions  Cryotherapy;Electrical Stimulation;Iontophoresis 4mg /ml Dexamethasone;Moist Heat;Ultrasound;Gait training;Therapeutic  exercise;Therapeutic activities;Neuromuscular re-education;Passive range of motion;Dry needling;Taping;Patient/family education    PT Next Visit Plan  assess Tx. core work in positions that are tolerable to him , he has home TENS unit and uses heat there       Patient will benefit from skilled therapeutic intervention in order to improve the following deficits and impairments:  Decreased activity tolerance, Decreased endurance, Decreased range of motion, Decreased strength, Difficulty walking, Increased muscle spasms, Impaired flexibility, Hypomobility, Obesity, Pain, Postural dysfunction  Visit Diagnosis: Pain in right hip  Difficulty in walking, not elsewhere classified  Other symptoms and signs involving the musculoskeletal system  Chronic right-sided low back pain without sciatica     Problem List Patient Active Problem List   Diagnosis Date Noted  . Coronary artery disease involving native heart without angina pectoris 06/09/2018  . Essential hypertension 06/09/2018  . Dyslipidemia 06/09/2018  . Educated about COVID-19 virus infection 06/09/2018    Scot Jun, PTA 06/08/2019, 9:18 AM  [place], [place], [address] Phone: [phone]   Fax:  [phone]  Name: Carlos Moreno MRN: [id number] Date of Birth: [birth year]

## 2019-06-18 ENCOUNTER — Ambulatory Visit: Payer: Medicare Other | Admitting: Gastroenterology

## 2019-06-22 ENCOUNTER — Ambulatory Visit: Payer: Medicare Other | Admitting: Gastroenterology

## 2019-06-22 ENCOUNTER — Encounter: Payer: Self-pay | Admitting: Gastroenterology

## 2019-06-22 VITALS — BP 118/58 | HR 60 | Temp 97.0°F | Ht 68.0 in | Wt 259.4 lb

## 2019-06-22 DIAGNOSIS — K2970 Gastritis, unspecified, without bleeding: Secondary | ICD-10-CM

## 2019-06-22 DIAGNOSIS — K219 Gastro-esophageal reflux disease without esophagitis: Secondary | ICD-10-CM | POA: Diagnosis not present

## 2019-06-22 DIAGNOSIS — K2990 Gastroduodenitis, unspecified, without bleeding: Secondary | ICD-10-CM

## 2019-06-22 MED ORDER — SUCRALFATE 1 G PO TABS: 1.0000 g | ORAL_TABLET | Freq: Two times a day (BID) | ORAL | 11 refills | Status: DC | PRN

## 2019-06-22 MED ORDER — FAMOTIDINE 20 MG PO TABS: 20.0000 mg | ORAL_TABLET | Freq: Two times a day (BID) | ORAL | 11 refills | Status: DC

## 2019-06-22 NOTE — Patient Instructions (Signed)
Stop Pantoprazole.   Start Pepcid 20mg  - twice daily.   Start Carafate 1gram - twice daily as needed.  We have sent the following medications to your pharmacy for you to pick up at your convenience: Pepcid and  Carafate   Please anti reflux measures-    Gastroesophageal Reflux Disease, Adult Gastroesophageal reflux (GER) happens when acid from the stomach flows up into the tube that connects the mouth and the stomach (esophagus). Normally, food travels down the esophagus and stays in the stomach to be digested. However, when a person has GER, food and stomach acid sometimes move back up into the esophagus. If this becomes a more serious problem, the person may be diagnosed with a disease called gastroesophageal reflux disease (GERD). GERD occurs when the reflux:  Happens often.  Causes frequent or severe symptoms.  Causes problems such as damage to the esophagus. When stomach acid comes in contact with the esophagus, the acid may cause soreness (inflammation) in the esophagus. Over time, GERD may create small holes (ulcers) in the lining of the esophagus. What are the causes? This condition is caused by a problem with the muscle between the esophagus and the stomach (lower esophageal sphincter, or LES). Normally, the LES muscle closes after food passes through the esophagus to the stomach. When the LES is weakened or abnormal, it does not close properly, and that allows food and stomach acid to go back up into the esophagus. The LES can be weakened by certain dietary substances, medicines, and medical conditions, including:  Tobacco use.  Pregnancy.  Having a hiatal hernia.  Alcohol use.  Certain foods and beverages, such as coffee, chocolate, onions, and peppermint. What increases the risk? You are more likely to develop this condition if you:  Have an increased body weight.  Have a connective tissue disorder.  Use NSAID medicines. What are the signs or symptoms? Symptoms  of this condition include:  Heartburn.  Difficult or painful swallowing.  The feeling of having a lump in the throat.  Abitter taste in the mouth.  Bad breath.  Having a large amount of saliva.  Having an upset or bloated stomach.  Belching.  Chest pain. Different conditions can cause chest pain. Make sure you see your health care provider if you experience chest pain.  Shortness of breath or wheezing.  Ongoing (chronic) cough or a night-time cough.  Wearing away of tooth enamel.  Weight loss. How is this diagnosed? Your health care provider will take a medical history and perform a physical exam. To determine if you have mild or severe GERD, your health care provider may also monitor how you respond to treatment. You may also have tests, including:  A test to examine your stomach and esophagus with a small camera (endoscopy).  A test thatmeasures the acidity level in your esophagus.  A test thatmeasures how much pressure is on your esophagus.  A barium swallow or modified barium swallow test to show the shape, size, and functioning of your esophagus. How is this treated? The goal of treatment is to help relieve your symptoms and to prevent complications. Treatment for this condition may vary depending on how severe your symptoms are. Your health care provider may recommend:  Changes to your diet.  Medicine.  Surgery. Follow these instructions at home: Eating and drinking   Follow a diet as recommended by your health care provider. This may involve avoiding foods and drinks such as: ? Coffee and tea (with or without caffeine). ? Drinks  that containalcohol. ? Energy drinks and sports drinks. ? Carbonated drinks or sodas. ? Chocolate and cocoa. ? Peppermint and mint flavorings. ? Garlic and onions. ? Horseradish. ? Spicy and acidic foods, including peppers, chili powder, curry powder, vinegar, hot sauces, and barbecue sauce. ? Citrus fruit juices and  citrus fruits, such as oranges, lemons, and limes. ? Tomato-based foods, such as red sauce, chili, salsa, and pizza with red sauce. ? Fried and fatty foods, such as donuts, french fries, potato chips, and high-fat dressings. ? High-fat meats, such as hot dogs and fatty cuts of red and white meats, such as rib eye steak, sausage, ham, and bacon. ? High-fat dairy items, such as whole milk, butter, and cream cheese.  Eat small, frequent meals instead of large meals.  Avoid drinking large amounts of liquid with your meals.  Avoid eating meals during the 2-3 hours before bedtime.  Avoid lying down right after you eat.  Do not exercise right after you eat. Lifestyle   Do not use any products that contain nicotine or tobacco, such as cigarettes, e-cigarettes, and chewing tobacco. If you need help quitting, ask your health care provider.  Try to reduce your stress by using methods such as yoga or meditation. If you need help reducing stress, ask your health care provider.  If you are overweight, reduce your weight to an amount that is healthy for you. Ask your health care provider for guidance about a safe weight loss goal. General instructions  Pay attention to any changes in your symptoms.  Take over-the-counter and prescription medicines only as told by your health care provider. Do not take aspirin, ibuprofen, or other NSAIDs unless your health care provider told you to do so.  Wear loose-fitting clothing. Do not wear anything tight around your waist that causes pressure on your abdomen.  Raise (elevate) the head of your bed about 6 inches (15 cm).  Avoid bending over if this makes your symptoms worse.  Keep all follow-up visits as told by your health care provider. This is important. Contact a health care provider if:  You have: ? New symptoms. ? Unexplained weight loss. ? Difficulty swallowing or it hurts to swallow. ? Wheezing or a persistent cough. ? A hoarse voice.  Your  symptoms do not improve with treatment. Get help right away if you:  Have pain in your arms, neck, jaw, teeth, or back.  Feel sweaty, dizzy, or light-headed.  Have chest pain or shortness of breath.  Vomit and your vomit looks like blood or coffee grounds.  Faint.  Have stool that is bloody or black.  Cannot swallow, drink, or eat. Summary  Gastroesophageal reflux happens when acid from the stomach flows up into the esophagus. GERD is a disease in which the reflux happens often, causes frequent or severe symptoms, or causes problems such as damage to the esophagus.  Treatment for this condition may vary depending on how severe your symptoms are. Your health care provider may recommend diet and lifestyle changes, medicine, or surgery.  Contact a health care provider if you have new or worsening symptoms.  Take over-the-counter and prescription medicines only as told by your health care provider. Do not take aspirin, ibuprofen, or other NSAIDs unless your health care provider told you to do so.  Keep all follow-up visits as told by your health care provider. This is important. This information is not intended to replace advice given to you by your health care provider. Make sure you discuss any  questions you have with your health care provider. Document Revised: 08/06/2017 Document Reviewed: 08/06/2017 Elsevier Patient Education  [employer].   We will see you back in 1-2 years.  Thank you for choosing me and [facility].  Dr.Nandigam

## 2019-06-22 NOTE — Progress Notes (Signed)
Carlos Moreno    [id number]    Aug 16, 1945  Primary Care Physician:Perini, Elta Guadel[place], MD  Referring Physician: Crist Infante, [place],   [id number]   Chief complaint: Gastritis, back pain  HPI:  74 year old male here for follow-up visit for gastritis  He has intermittent back pain, has been taking ibuprofen intermittently  He was started on Protonix after he had a GI bleed in [place], no clear source of the bleed but he has been taking Protonix since then  EGD May 03, 2019: S/p gastric bypass.  Gastric pouch and gastrojejunal anastomosis with congestion, erythema and linear erosions.  Otherwise rest of exam was unremarkable.  Gastric biopsies negative for H. pylori  Colonoscopy May 03, 2019: 3 sessile adenomatous polyps removed, diverticulosis and hemorrhoids.  Recommended recall colonoscopy in 3 years  Denies any nausea, vomiting, abdominal pain, melena or bright red blood per rectum   Outpatient Encounter Medications as of 06/22/2019  Medication Sig  . Aspirin Buf,CaCarb-MgCarb-MgO, 81 MG TABS Adult Low Dose Aspirin 81 mg tablet   1 tablet every day by oral route.  Marland Kitchen atorvastatin (LIPITOR) 40 MG tablet Take 40 mg by mouth daily.  Marland Kitchen b complex vitamins capsule Take 1 capsule by mouth as directed.  Marland Kitchen BYSTOLIC 5 MG tablet Take 5 mg by mouth daily.  . calcium citrate (CALCITRATE - DOSED IN MG ELEMENTAL CALCIUM) 950 (200 Ca) MG tablet Take by mouth.  . calcium gluconate 500 MG tablet Take 1 tablet by mouth daily.  . calcium-vitamin D (OSCAL WITH D) 500-200 MG-UNIT TABS tablet Take by mouth.  . cetirizine (ZYRTEC) 10 MG tablet Take 10 mg by mouth daily.  . Cyanocobalamin (B-12) 5000 MCG SUBL Place 1 tablet under the tongue as directed.  . Diphenhydramine-APAP, sleep, (TYLENOL PM EXTRA STRENGTH PO) Take 1 tablet by mouth daily.  . isosorbide mononitrate (IMDUR) 30 MG 24 hr tablet Take 30 mg by mouth daily.  Marland Kitchen JARDIANCE 25 MG TABS tablet Take 1  tablet by mouth daily.  Marland Kitchen losartan (COZAAR) 100 MG tablet Take 100 mg by mouth daily.  . Multiple Vitamin (CHEW-12) CHEW Chew by mouth.  . Multiple Vitamin (MULTIVITAMIN) tablet Take 1 tablet by mouth daily.  . Multiple Vitamins-Minerals (VISION FORMULA/LUTEIN) TABS Take by mouth.  . nitroGLYCERIN (NITROSTAT) 0.4 MG SL tablet Place 1 tablet (0.4 mg total) under the tongue every 5 (five) minutes as needed for chest pain.  . pantoprazole (PROTONIX) 40 MG tablet Take 1 tablet by mouth daily.  . Polysaccharide Iron Complex (IRON UP PO) Take 120 mg by mouth daily.  Marland Kitchen tiZANidine (ZANAFLEX) 4 MG tablet Take 4 mg by mouth at bedtime.  . TRADJENTA 5 MG TABS tablet Take 1 tablet by mouth daily.  . traMADol (ULTRAM) 50 MG tablet Take 50 mg by mouth every 6 (six) hours as needed.  . vitamin C (ASCORBIC ACID) 500 MG tablet Take 500 mg by mouth daily.  Marland Kitchen zinc gluconate 50 MG tablet Take by mouth.   No facility-administered encounter medications on file as of 06/22/2019.    Allergies as of 06/22/2019  . (No Known Allergies)    Past Medical History:  Diagnosis Date  . Anemia   . Basal cell carcinoma   . CAD (coronary artery disease)    Stent 2017.  Stent 2005 (Details of both stents pending)  . Cataract   . Curvature of spine   . DM (diabetes mellitus) ([place])   .  Gallstones   . Hypertension   . Kidney stones   . Macular degeneration   . Obesity   . OSA (obstructive sleep apnea)    CPAP    Past Surgical History:  Procedure Laterality Date  . ABDOMINOPLASTY  02/2009  . ANGIOPLASTY  06/05 05/17   with Stents  . CATARACT EXTRACTION, BILATERAL  03/2007  . CHOLECYSTECTOMY  2001  . COLONOSCOPY  08/07 11/12 04/16   . HIP SURGERY Left   . INGUINAL HERNIA REPAIR Left 1956  . KNEE ARTHROSCOPY Right 05/2005   x3  . LAMINECTOMY  2009   Lumbar  . REPLACEMENT TOTAL KNEE Left 1994  . REPLACEMENT TOTAL KNEE Right    x 3  . 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 TOTAL HIP ARTHROPLASTY Right  01/2007    Family History  Problem Relation Age of Onset  . Bladder Cancer Mother   . Rheum arthritis Mother   . Colon cancer Father 24  . Diabetes Paternal Grandmother   . Tuberculosis Paternal Grandfather   . Esophageal cancer Neg Hx   . Rectal cancer Neg Hx     Social History   Socioeconomic History  . Marital status: Married    Spouse name: Not on file  . Number of children: 3  . Years of education: Not on file  . Highest education level: Not on file  Occupational History  . Occupation: Retired English as a second language teacher  Tobacco Use  . Smoking status: Former Smoker    Types: Pipe  . Smokeless tobacco: Never Used  Substance and Sexual Activity  . Alcohol use: Yes    Comment: highball and wine daily  . Drug use: Never  . Sexual activity: Not on file  Other Topics Concern  . Not on file  Social History Narrative  . Not on file   Social Determinants of Health   Financial Resource Strain:   . Difficulty of Paying Living Expenses:   Food Insecurity:   . Worried About Charity fundraiser in the Last Year:   . Arboriculturist in the Last Year:   Transportation Needs:   . Film/video editor (Medical):   Marland Kitchen Lack of Transportation (Non-Medical):   Physical Activity:   . Days of Exercise per Week:   . Minutes of Exercise per Session:   Stress:   . Feeling of Stress :   Social Connections:   . Frequency of Communication with Friends and Family:   . Frequency of Social Gatherings with Friends and Family:   . Attends Religious Services:   . Active Member of Clubs or Organizations:   . Attends Archivist Meetings:   Marland Kitchen Marital Status:   Intimate Partner Violence:   . Fear of Current or Ex-Partner:   . Emotionally Abused:   Marland Kitchen Physically Abused:   . Sexually Abused:       Review of systems: All other review of systems negative except as mentioned in the HPI.   Physical Exam: Vitals:   06/22/19 1031  BP: (!) 118/58  Pulse: 60  Temp: (!) 97 F (36.1 C)    Body mass index is 39.44 kg/m. 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 The patient was provided an opportunity to ask questions and all were answered. The patient agreed with the plan and demonstrated an understanding of the instructions.  Damaris Hippo , MD    CC: Crist Infante, MD

## 2019-06-23 ENCOUNTER — Encounter: Payer: Self-pay | Admitting: Gastroenterology

## 2019-07-27 ENCOUNTER — Inpatient Hospital Stay (HOSPITAL_COMMUNITY)
Admission: AD | Admit: 2019-07-27 | Discharge: 2019-07-29 | DRG: 309 | Disposition: A | Discharge status: Home / Self Care | Payer: Medicare Other | Attending: Cardiology | Admitting: Cardiology

## 2019-07-27 ENCOUNTER — Other Ambulatory Visit: Payer: Self-pay

## 2019-07-27 ENCOUNTER — Emergency Department (HOSPITAL_COMMUNITY): Payer: Medicare Other

## 2019-07-27 ENCOUNTER — Encounter (HOSPITAL_COMMUNITY): Payer: Self-pay | Admitting: Emergency Medicine

## 2019-07-27 DIAGNOSIS — Z9989 Dependence on other enabling machines and devices: Secondary | ICD-10-CM

## 2019-07-27 DIAGNOSIS — R001 Bradycardia, unspecified: Secondary | ICD-10-CM | POA: Diagnosis present

## 2019-07-27 DIAGNOSIS — I4892 Unspecified atrial flutter: Secondary | ICD-10-CM | POA: Diagnosis not present

## 2019-07-27 DIAGNOSIS — Z96641 Presence of right artificial hip joint: Secondary | ICD-10-CM | POA: Diagnosis present

## 2019-07-27 DIAGNOSIS — Z8052 Family history of malignant neoplasm of bladder: Secondary | ICD-10-CM

## 2019-07-27 DIAGNOSIS — N183 Chronic kidney disease, stage 3 unspecified: Secondary | ICD-10-CM

## 2019-07-27 DIAGNOSIS — E669 Obesity, unspecified: Secondary | ICD-10-CM | POA: Diagnosis present

## 2019-07-27 DIAGNOSIS — D7589 Other specified diseases of blood and blood-forming organs: Secondary | ICD-10-CM

## 2019-07-27 DIAGNOSIS — H3530 Unspecified macular degeneration: Secondary | ICD-10-CM | POA: Diagnosis present

## 2019-07-27 DIAGNOSIS — Z79899 Other long term (current) drug therapy: Secondary | ICD-10-CM

## 2019-07-27 DIAGNOSIS — Z955 Presence of coronary angioplasty implant and graft: Secondary | ICD-10-CM

## 2019-07-27 DIAGNOSIS — I10 Essential (primary) hypertension: Secondary | ICD-10-CM | POA: Diagnosis not present

## 2019-07-27 DIAGNOSIS — Z6837 Body mass index (BMI) 37.0-37.9, adult: Secondary | ICD-10-CM

## 2019-07-27 DIAGNOSIS — Z87442 Personal history of urinary calculi: Secondary | ICD-10-CM

## 2019-07-27 DIAGNOSIS — Z85828 Personal history of other malignant neoplasm of skin: Secondary | ICD-10-CM

## 2019-07-27 DIAGNOSIS — E119 Type 2 diabetes mellitus without complications: Secondary | ICD-10-CM

## 2019-07-27 DIAGNOSIS — I483 Typical atrial flutter: Secondary | ICD-10-CM | POA: Diagnosis not present

## 2019-07-27 DIAGNOSIS — Z7982 Long term (current) use of aspirin: Secondary | ICD-10-CM

## 2019-07-27 DIAGNOSIS — R7989 Other specified abnormal findings of blood chemistry: Secondary | ICD-10-CM | POA: Diagnosis present

## 2019-07-27 DIAGNOSIS — Z96653 Presence of artificial knee joint, bilateral: Secondary | ICD-10-CM | POA: Diagnosis present

## 2019-07-27 DIAGNOSIS — Z800 Family history of malignant neoplasm of digestive organs: Secondary | ICD-10-CM

## 2019-07-27 DIAGNOSIS — Z833 Family history of diabetes mellitus: Secondary | ICD-10-CM

## 2019-07-27 DIAGNOSIS — I129 Hypertensive chronic kidney disease with stage 1 through stage 4 chronic kidney disease, or unspecified chronic kidney disease: Secondary | ICD-10-CM | POA: Diagnosis present

## 2019-07-27 DIAGNOSIS — Z7984 Long term (current) use of oral hypoglycemic drugs: Secondary | ICD-10-CM

## 2019-07-27 DIAGNOSIS — Z9049 Acquired absence of other specified parts of digestive tract: Secondary | ICD-10-CM

## 2019-07-27 DIAGNOSIS — Z20822 Contact with and (suspected) exposure to covid-19: Secondary | ICD-10-CM | POA: Diagnosis present

## 2019-07-27 DIAGNOSIS — N184 Chronic kidney disease, stage 4 (severe): Secondary | ICD-10-CM | POA: Diagnosis not present

## 2019-07-27 DIAGNOSIS — Z8261 Family history of arthritis: Secondary | ICD-10-CM

## 2019-07-27 DIAGNOSIS — I4891 Unspecified atrial fibrillation: Secondary | ICD-10-CM | POA: Diagnosis present

## 2019-07-27 DIAGNOSIS — I2510 Atherosclerotic heart disease of native coronary artery without angina pectoris: Secondary | ICD-10-CM | POA: Diagnosis present

## 2019-07-27 DIAGNOSIS — E785 Hyperlipidemia, unspecified: Secondary | ICD-10-CM | POA: Diagnosis present

## 2019-07-27 DIAGNOSIS — Z87891 Personal history of nicotine dependence: Secondary | ICD-10-CM

## 2019-07-27 DIAGNOSIS — Z9884 Bariatric surgery status: Secondary | ICD-10-CM

## 2019-07-27 DIAGNOSIS — Z8719 Personal history of other diseases of the digestive system: Secondary | ICD-10-CM

## 2019-07-27 DIAGNOSIS — E1122 Type 2 diabetes mellitus with diabetic chronic kidney disease: Secondary | ICD-10-CM

## 2019-07-27 DIAGNOSIS — M1990 Unspecified osteoarthritis, unspecified site: Secondary | ICD-10-CM | POA: Diagnosis present

## 2019-07-27 DIAGNOSIS — G4733 Obstructive sleep apnea (adult) (pediatric): Secondary | ICD-10-CM | POA: Diagnosis present

## 2019-07-27 HISTORY — DX: Unspecified atrial flutter: I48.92

## 2019-07-27 HISTORY — DX: Chronic kidney disease, stage 3 unspecified: N18.30

## 2019-07-27 HISTORY — DX: Type 2 diabetes mellitus without complications: E11.9

## 2019-07-27 HISTORY — DX: Type 2 diabetes mellitus with diabetic chronic kidney disease: E11.22

## 2019-07-27 LAB — GLUCOSE, CAPILLARY: Glucose-Capillary: 186 mg/dL — ABNORMAL HIGH (ref 70–99)

## 2019-07-27 LAB — TSH: TSH: 0.826 u[IU]/mL (ref 0.350–4.500)

## 2019-07-27 LAB — TROPONIN I (HIGH SENSITIVITY)
Troponin I (High Sensitivity): 18 ng/L — ABNORMAL HIGH (ref <18)
Troponin I (High Sensitivity): 18 ng/L — ABNORMAL HIGH (ref <18)
Troponin I (High Sensitivity): 16 ng/L (ref <18)
Troponin I (High Sensitivity): 17 ng/L (ref <18)

## 2019-07-27 LAB — CBG MONITORING, ED: Glucose-Capillary: 134 mg/dL — ABNORMAL HIGH (ref 70–99)

## 2019-07-27 LAB — CBC WITH DIFFERENTIAL/PLATELET
Abs Immature Granulocytes: 0.06 10*3/uL (ref 0.00–0.07)
Basophils Absolute: 0 10*3/uL (ref 0.0–0.1)
Basophils Relative: 0 %
Eosinophils Absolute: 0.1 10*3/uL (ref 0.0–0.5)
Eosinophils Relative: 0 %
HCT: 40.9 % (ref 39.0–52.0)
Hemoglobin: 13.2 g/dL (ref 13.0–17.0)
Immature Granulocytes: 1 %
Lymphocytes Relative: 7 %
Lymphs Abs: 0.8 10*3/uL (ref 0.7–4.0)
MCH: 33.3 pg (ref 26.0–34.0)
MCHC: 32.3 g/dL (ref 30.0–36.0)
MCV: 103.3 fL — ABNORMAL HIGH (ref 80.0–100.0)
Monocytes Absolute: 1.2 10*3/uL — ABNORMAL HIGH (ref 0.1–1.0)
Monocytes Relative: 10 %
Neutro Abs: 10.1 10*3/uL — ABNORMAL HIGH (ref 1.7–7.7)
Neutrophils Relative %: 82 %
Platelets: 179 10*3/uL (ref 150–400)
RBC: 3.96 MIL/uL — ABNORMAL LOW (ref 4.22–5.81)
RDW: 13.1 % (ref 11.5–15.5)
WBC: 12.3 10*3/uL — ABNORMAL HIGH (ref 4.0–10.5)
nRBC: 0 % (ref 0.0–0.2)

## 2019-07-27 LAB — BASIC METABOLIC PANEL
Anion gap: 9 (ref 5–15)
BUN: 61 mg/dL — ABNORMAL HIGH (ref 8–23)
CO2: 17 mmol/L — ABNORMAL LOW (ref 22–32)
Calcium: 8.9 mg/dL (ref 8.9–10.3)
Chloride: 112 mmol/L — ABNORMAL HIGH (ref 98–111)
Creatinine, Ser: 2.18 mg/dL — ABNORMAL HIGH (ref 0.61–1.24)
GFR calc Af Amer: 33 mL/min — ABNORMAL LOW (ref >60)
GFR calc non Af Amer: 29 mL/min — ABNORMAL LOW (ref >60)
Glucose, Bld: 194 mg/dL — ABNORMAL HIGH (ref 70–99)
Potassium: 4.5 mmol/L (ref 3.5–5.1)
Sodium: 138 mmol/L (ref 135–145)

## 2019-07-27 LAB — MAGNESIUM: Magnesium: 2.5 mg/dL — ABNORMAL HIGH (ref 1.7–2.4)

## 2019-07-27 LAB — SARS CORONAVIRUS 2 BY RT PCR (HOSPITAL ORDER, PERFORMED IN ~~LOC~~ HOSPITAL LAB): SARS Coronavirus 2: NEGATIVE

## 2019-07-27 IMAGING — DX DG CHEST 2V
2 series · 2 of 2 positions shown · non-contrast
Comparison: Thoracic spine x-rays from [DATE]

CLINICAL DATA: New onset a flutter

EXAM:
CHEST - 2 VIEW

[chest pa]
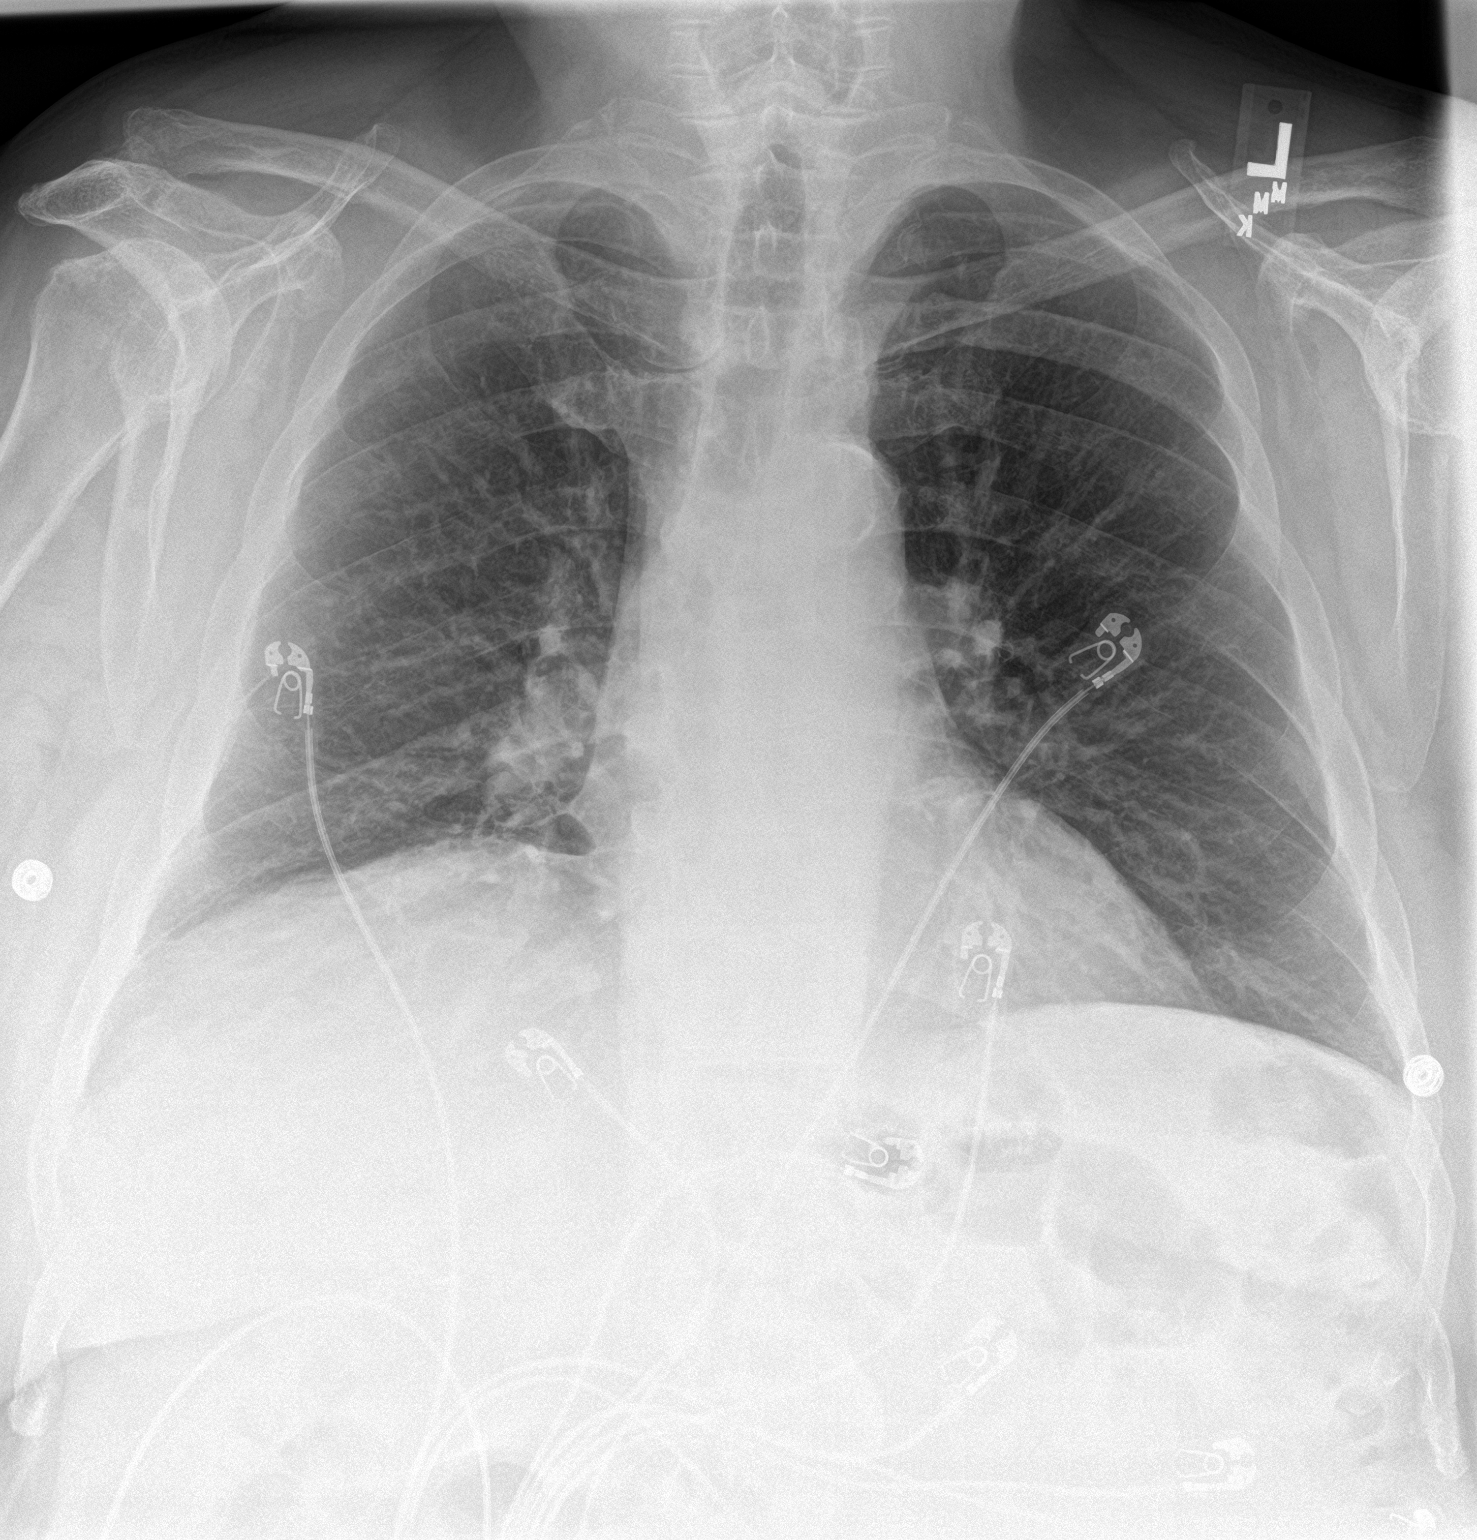

[chest lat]
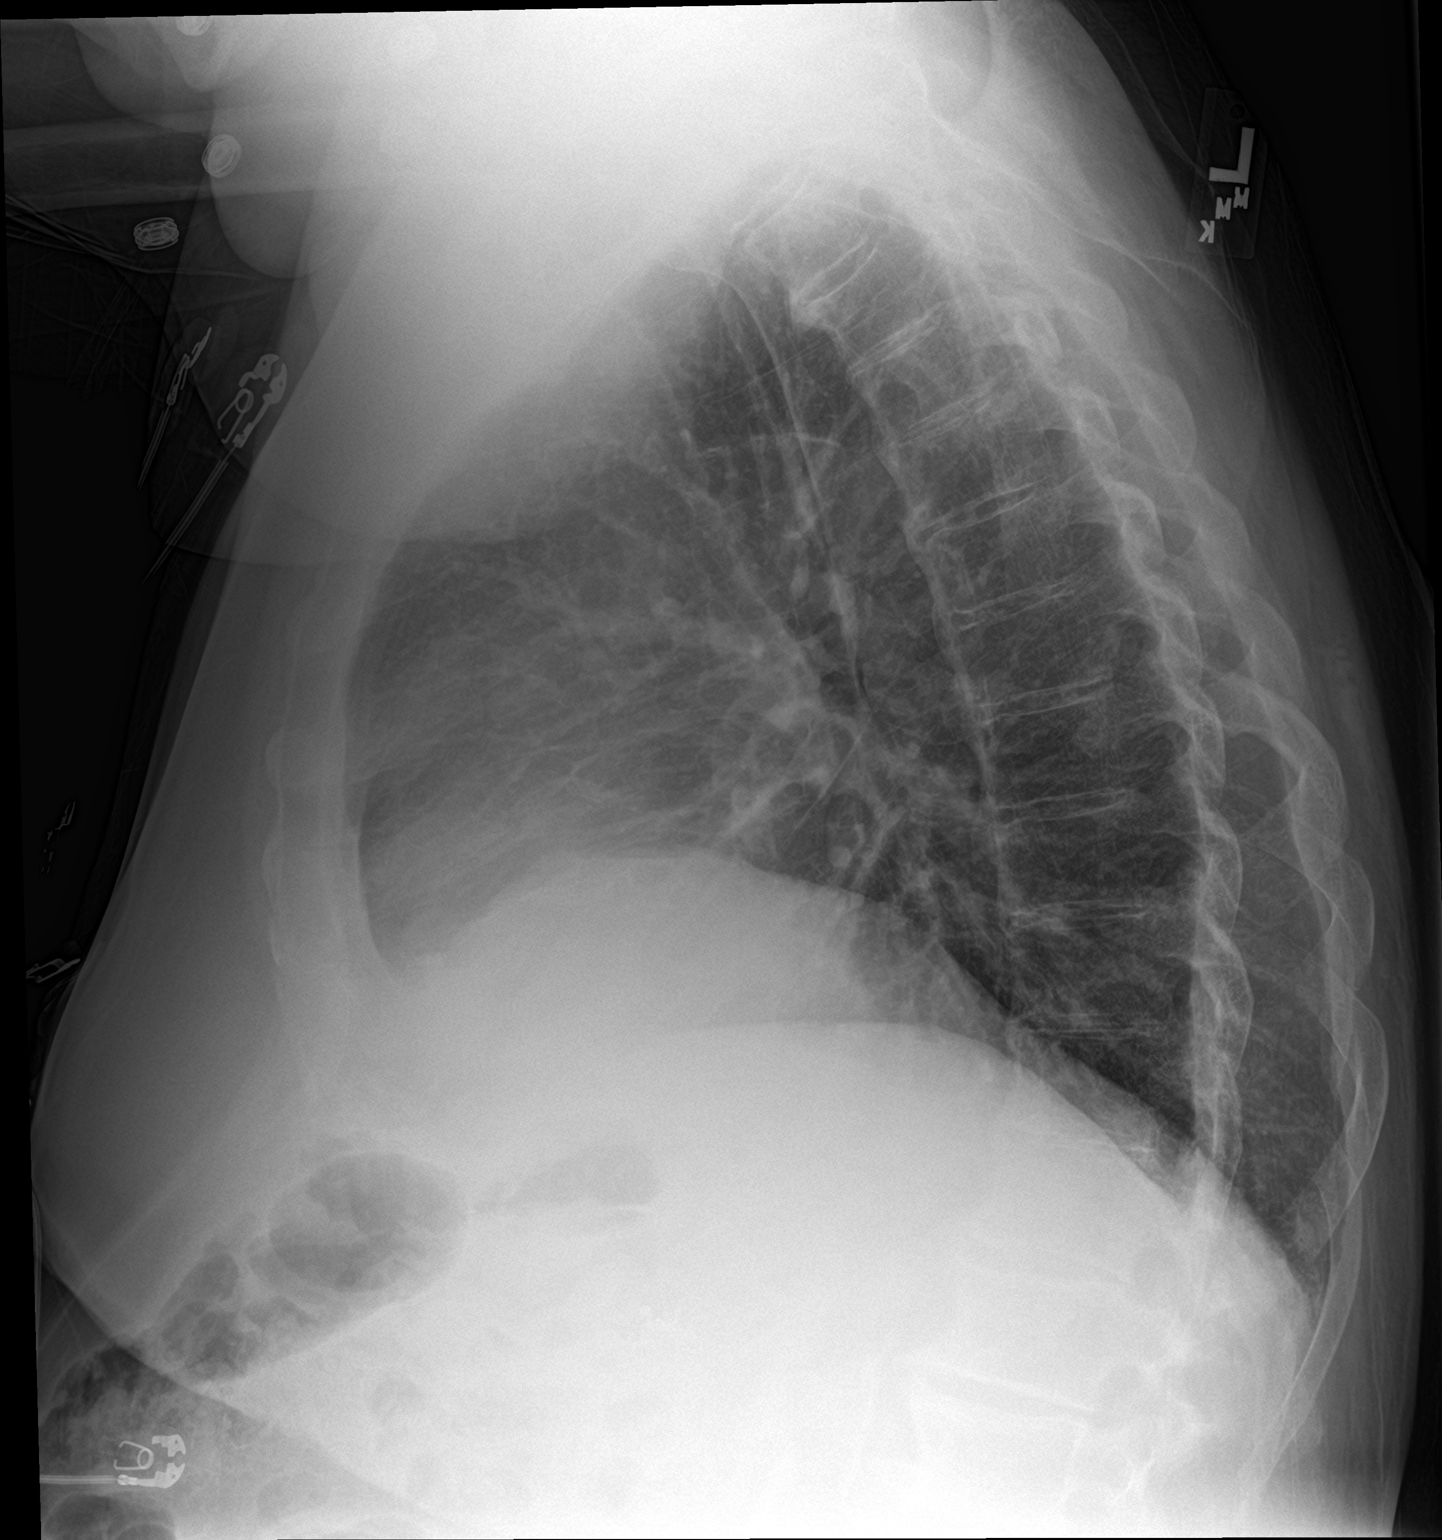

[2 of 2 positions shown; findings below may reference images not displayed]

FINDINGS: Cardiomediastinal contours are normal. The signs of aortic
atherosclerosis in the aortic arch. Hilar structures are normal.

Lungs are clear.

No sign of pleural effusion. On limited assessment skeletal
structures are unremarkable aside from degenerative changes in the
shoulders and spine.
IMPRESSION: No acute cardiopulmonary disease.

## 2019-07-27 MED ORDER — TRAMADOL HCL 50 MG PO TABS
50.0000 mg | ORAL_TABLET | Freq: Four times a day (QID) | ORAL | Status: DC | PRN
  Administered 2019-07-27 – 2019-07-28 (×2): 50 mg via ORAL
  Filled 2019-07-27 (×2): qty 1

## 2019-07-27 MED ORDER — CALCIUM CARBONATE-VITAMIN D 500-200 MG-UNIT PO TABS
2.0000 | ORAL_TABLET | Freq: Every day | ORAL | Status: DC
  Administered 2019-07-27 – 2019-07-29 (×3): 2 via ORAL
  Filled 2019-07-27 (×3): qty 2

## 2019-07-27 MED ORDER — NITROGLYCERIN 0.4 MG SL SUBL: 0.4000 mg | SUBLINGUAL_TABLET | SUBLINGUAL | Status: DC | PRN

## 2019-07-27 MED ORDER — ACETAMINOPHEN 325 MG PO TABS: 650.0000 mg | ORAL_TABLET | ORAL | Status: DC | PRN

## 2019-07-27 MED ORDER — ZOLPIDEM TARTRATE 5 MG PO TABS: 5.0000 mg | ORAL_TABLET | Freq: Every evening | ORAL | Status: DC | PRN

## 2019-07-27 MED ORDER — ASCORBIC ACID 500 MG PO TABS
500.0000 mg | ORAL_TABLET | Freq: Every day | ORAL | Status: DC
  Administered 2019-07-27 – 2019-07-29 (×3): 500 mg via ORAL
  Filled 2019-07-27 (×3): qty 1

## 2019-07-27 MED ORDER — ATORVASTATIN CALCIUM 40 MG PO TABS
40.0000 mg | ORAL_TABLET | Freq: Every day | ORAL | Status: DC
  Administered 2019-07-27 – 2019-07-29 (×3): 40 mg via ORAL
  Filled 2019-07-27 (×3): qty 1

## 2019-07-27 MED ORDER — ASPIRIN EC 81 MG PO TBEC
81.0000 mg | DELAYED_RELEASE_TABLET | Freq: Every day | ORAL | Status: DC
  Administered 2019-07-27 – 2019-07-28 (×2): 81 mg via ORAL
  Filled 2019-07-27 (×2): qty 1

## 2019-07-27 MED ORDER — LINAGLIPTIN 5 MG PO TABS
5.0000 mg | ORAL_TABLET | Freq: Every day | ORAL | Status: DC
  Administered 2019-07-28 – 2019-07-29 (×2): 5 mg via ORAL
  Filled 2019-07-27 (×2): qty 1

## 2019-07-27 MED ORDER — NEBIVOLOL HCL 10 MG PO TABS
10.0000 mg | ORAL_TABLET | Freq: Every day | ORAL | Status: DC
  Administered 2019-07-27 – 2019-07-29 (×3): 10 mg via ORAL
  Filled 2019-07-27 (×3): qty 1

## 2019-07-27 MED ORDER — VISION FORMULA/LUTEIN PO TABS: 1.0000 | ORAL_TABLET | Freq: Every day | ORAL | Status: DC

## 2019-07-27 MED ORDER — SODIUM CHLORIDE 0.9% FLUSH
3.0000 mL | Freq: Two times a day (BID) | INTRAVENOUS | Status: DC
  Administered 2019-07-27: 3 mL via INTRAVENOUS

## 2019-07-27 MED ORDER — TIZANIDINE HCL 4 MG PO TABS
4.0000 mg | ORAL_TABLET | Freq: Every day | ORAL | Status: DC
  Administered 2019-07-27 – 2019-07-28 (×2): 4 mg via ORAL
  Filled 2019-07-27 (×2): qty 1

## 2019-07-27 MED ORDER — LORATADINE 10 MG PO TABS
10.0000 mg | ORAL_TABLET | Freq: Every day | ORAL | Status: DC
  Administered 2019-07-27: 10 mg via ORAL
  Filled 2019-07-27 (×3): qty 1

## 2019-07-27 MED ORDER — MELATONIN 5 MG PO TABS
5.0000 mg | ORAL_TABLET | Freq: Every evening | ORAL | Status: DC
  Administered 2019-07-27 – 2019-07-28 (×2): 5 mg via ORAL
  Filled 2019-07-27 (×3): qty 1

## 2019-07-27 MED ORDER — INSULIN ASPART 100 UNIT/ML ~~LOC~~ SOLN: 0.0000 [IU] | Freq: Every day | SUBCUTANEOUS | Status: DC

## 2019-07-27 MED ORDER — SODIUM CHLORIDE 0.9% FLUSH: 3.0000 mL | INTRAVENOUS | Status: DC | PRN

## 2019-07-27 MED ORDER — SODIUM CHLORIDE 0.9 % IV SOLN: INTRAVENOUS | Status: DC

## 2019-07-27 MED ORDER — B COMPLEX VITAMINS PO CAPS: 1.0000 | ORAL_CAPSULE | ORAL | Status: DC

## 2019-07-27 MED ORDER — NEBIVOLOL HCL 5 MG PO TABS: 5.0000 mg | ORAL_TABLET | Freq: Every day | ORAL | Status: DC

## 2019-07-27 MED ORDER — INSULIN ASPART 100 UNIT/ML ~~LOC~~ SOLN
0.0000 [IU] | Freq: Three times a day (TID) | SUBCUTANEOUS | Status: DC
  Administered 2019-07-27: 1 [IU] via SUBCUTANEOUS
  Administered 2019-07-28: 3 [IU] via SUBCUTANEOUS
  Administered 2019-07-28 – 2019-07-29 (×2): 2 [IU] via SUBCUTANEOUS
  Administered 2019-07-29: 1 [IU] via SUBCUTANEOUS
  Administered 2019-07-29: 2 [IU] via SUBCUTANEOUS

## 2019-07-27 MED ORDER — ALPRAZOLAM 0.25 MG PO TABS: 0.2500 mg | ORAL_TABLET | Freq: Two times a day (BID) | ORAL | Status: DC | PRN

## 2019-07-27 MED ORDER — ONDANSETRON HCL 4 MG/2ML IJ SOLN: 4.0000 mg | Freq: Four times a day (QID) | INTRAMUSCULAR | Status: DC | PRN

## 2019-07-27 MED ORDER — VITAMIN B-12 1000 MCG PO TABS
5000.0000 ug | ORAL_TABLET | ORAL | Status: DC
  Administered 2019-07-28 – 2019-07-29 (×2): 5000 ug via ORAL
  Filled 2019-07-27 (×2): qty 5

## 2019-07-27 MED ORDER — ISOSORBIDE MONONITRATE ER 30 MG PO TB24
30.0000 mg | ORAL_TABLET | Freq: Every day | ORAL | Status: DC
  Administered 2019-07-28 – 2019-07-29 (×2): 30 mg via ORAL
  Filled 2019-07-27 (×2): qty 1

## 2019-07-27 MED ORDER — FAMOTIDINE 20 MG PO TABS
20.0000 mg | ORAL_TABLET | Freq: Every day | ORAL | Status: DC
  Administered 2019-07-27 – 2019-07-29 (×3): 20 mg via ORAL
  Filled 2019-07-27 (×3): qty 1

## 2019-07-27 MED ORDER — SODIUM CHLORIDE 0.9 % IV SOLN: 250.0000 mL | INTRAVENOUS | Status: DC | PRN

## 2019-07-27 MED ORDER — ZINC GLUCONATE 50 MG PO TABS: 50.0000 mg | ORAL_TABLET | Freq: Every day | ORAL | Status: DC

## 2019-07-27 MED ORDER — SUCRALFATE 1 G PO TABS
1.0000 g | ORAL_TABLET | Freq: Two times a day (BID) | ORAL | Status: DC
  Administered 2019-07-28 – 2019-07-29 (×2): 1 g via ORAL
  Filled 2019-07-27 (×3): qty 1

## 2019-07-27 NOTE — ED Provider Notes (Signed)
  Physical Exam  BP 103/62   Pulse (!) 47   Resp 16   Ht 5' 9.5" (1.765 m)   Wt 115.3 kg   SpO2 100%   BMI 37.00 kg/m   Physical Exam  ED Course/Procedures     Procedures  MDM  Signout pending cardiology consult.  Cardiology saw patient and will admit patient to their service.      Drenda Freeze, MD 07/27/19 [phone]

## 2019-07-27 NOTE — ED Triage Notes (Signed)
Pt. Stated, I had a steroid inj last week for my back and my hip. Last Wednesday I started in A-Fib and my pulse has been all over the place.

## 2019-07-27 NOTE — ED Provider Notes (Signed)
[facility] EMERGENCY DEPARTMENT Provider Note   CSN: [id number] Arrival date & time: 07/27/19  1005     History Chief Complaint  Patient presents with  . Atrial Fibrillation    Carlos Moreno is a 74 y.o. male.  Presents to ER with concern for A. fib.  Patient states since the weekend he has had decreased energy than normal.  Has some chest discomfort but no chest pain.  No palpitations.  States that his smart watch reported on Saturday or Sunday that he was in atrial fibrillation.  Currently denies any chest pain or difficulty breathing.  Denies prior history of DVT/PE, no prior history of A. fib.  Not on anticoagulation.  Has history of CAD, last stent 2017, stent 2005.  Followed by [facility] cardiology currently.  HPI     Past Medical History:  Diagnosis Date  . Anemia   . Basal cell carcinoma   . CAD (coronary artery disease)    Stent 2017.  Stent 2005 (Details of both stents pending)  . Cataract   . Curvature of spine   . DM (diabetes mellitus) ([place])   . Gallstones   . Hypertension   . Kidney stones   . Macular degeneration   . Obesity   . OSA (obstructive sleep apnea)    CPAP    Patient Active Problem List   Diagnosis Date Noted  . Coronary artery disease involving native heart without angina pectoris 06/09/2018  . Essential hypertension 06/09/2018  . Dyslipidemia 06/09/2018  . Educated about COVID-19 virus infection 06/09/2018    Past Surgical History:  Procedure Laterality Date  . ABDOMINOPLASTY  02/2009  . ANGIOPLASTY  06/05 05/17   with Stents  . CATARACT EXTRACTION, BILATERAL  03/2007  . CHOLECYSTECTOMY  2001  . COLONOSCOPY  08/07 11/12 04/16   . HIP SURGERY Left   . INGUINAL HERNIA REPAIR Left 1956  . KNEE ARTHROSCOPY Right 05/2005   x3  . LAMINECTOMY  2009   Lumbar  . REPLACEMENT TOTAL KNEE Left 1994  . REPLACEMENT TOTAL KNEE Right    x 3  . 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 TOTAL HIP ARTHROPLASTY Right 01/2007        Family History  Problem Relation Age of Onset  . Bladder Cancer Mother   . Rheum arthritis Mother   . Colon cancer Father 79  . Diabetes Paternal Grandmother   . Tuberculosis Paternal Grandfather   . Esophageal cancer Neg Hx   . Rectal cancer Neg Hx     Social History   Tobacco Use  . Smoking status: Former Smoker    Types: Pipe  . Smokeless tobacco: Never Used  Vaping Use  . Vaping Use: Never used  Substance Use Topics  . Alcohol use: Yes    Comment: highball and wine daily  . Drug use: Never    Home Medications Prior to Admission medications   Medication Sig Start Date End Date Taking? Authorizing Provider  aspirin EC 81 MG tablet Take 81 mg by mouth daily. Swallow whole.   Yes [provider]  atorvastatin (LIPITOR) 40 MG tablet Take 40 mg by mouth daily. 12/19/16  Yes [provider]  b complex vitamins capsule Take 1 capsule by mouth every other day.    Yes [provider]  BYSTOLIC 5 MG tablet Take 5 mg by mouth daily. 12/19/16  Yes [provider]  Calcium-Phosphorus-Vitamin D (CALCIUM GUMMIES PO) Take 2 tablets by mouth daily. Takes two daily  Yes [provider]  cetirizine (ZYRTEC) 10 MG tablet Take 10 mg by mouth daily.   Yes [provider]  Cyanocobalamin (B-12) 5000 MCG SUBL Place 1 tablet under the tongue every other day.    Yes [provider]  famotidine (PEPCID) 20 MG tablet Take 1 tablet (20 mg total) by mouth 2 (two) times daily. 06/22/19  Yes Nandigam, Venia Minks, MD  isosorbide mononitrate (IMDUR) 30 MG 24 hr tablet Take 30 mg by mouth daily. 12/19/16  Yes [provider]  JARDIANCE 25 MG TABS tablet Take 25 mg by mouth daily.  06/08/18  Yes [provider]  losartan (COZAAR) 100 MG tablet Take 100 mg by mouth daily.   Yes [provider]  Melatonin ER 5 MG TBCR Take 5 mg by mouth every evening.    Yes [provider]  Multiple Vitamins-Minerals  (VISION FORMULA/LUTEIN) TABS Take 1 tablet by mouth daily.  10/10/14  Yes [provider]  nitroGLYCERIN (NITROSTAT) 0.4 MG SL tablet Place 1 tablet (0.4 mg total) under the tongue every 5 (five) minutes as needed for chest pain. 05/25/19 08/23/19 Yes HochreinJeneen Rinks, MD  Polysaccharide Iron Complex (IRON UP PO) Take 120 mg by mouth daily.   Yes [provider]  sucralfate (CARAFATE) 1 g tablet Take 1 tablet (1 g total) by mouth 2 (two) times daily as needed. 06/22/19  Yes Nandigam, Venia Minks, MD  tiZANidine (ZANAFLEX) 4 MG tablet Take 4 mg by mouth at bedtime. 03/30/19  Yes [provider]  TRADJENTA 5 MG TABS tablet Take 5 mg by mouth daily.  06/08/18  Yes [provider]  traMADol (ULTRAM) 50 MG tablet Take 50 mg by mouth every 6 (six) hours as needed for moderate pain.    Yes [provider]  vitamin C (ASCORBIC ACID) 500 MG tablet Take 500 mg by mouth daily.   Yes [provider]  zinc gluconate 50 MG tablet Take 50 mg by mouth daily.  03/17/14  Yes [provider]    Allergies    Patient has no known allergies.  Review of Systems   Review of Systems  Constitutional: Positive for fatigue. Negative for chills and fever.  HENT: Negative for ear pain and sore throat.   Eyes: Negative for pain and visual disturbance.  Respiratory: Negative for cough and shortness of breath.   Cardiovascular: Positive for chest pain and palpitations.  Gastrointestinal: Negative for abdominal pain and vomiting.  Genitourinary: Negative for dysuria and hematuria.  Musculoskeletal: Negative for arthralgias and back pain.  Skin: Negative for color change and rash.  Neurological: Negative for seizures and syncope.  All other systems reviewed and are negative.   Physical Exam Updated Vital Signs BP (!) 91/53   Pulse (!) 59   Resp 17   Ht 5' 9.5" (1.765 m)   Wt 115.3 kg   SpO2 97%   BMI 37.00 kg/m   Physical Exam Vitals and nursing note  reviewed.  Constitutional:      Appearance: He is well-developed.  HENT:     Head: Normocephalic and atraumatic.  Eyes:     Conjunctiva/sclera: Conjunctivae normal.  Cardiovascular:     Rate and Rhythm: Tachycardia present. Rhythm irregular.     Pulses: Normal pulses.     Heart sounds: No murmur heard.   Pulmonary:     Effort: Pulmonary effort is normal. No respiratory distress.     Breath sounds: Normal breath sounds.  Abdominal:  Palpations: Abdomen is soft.     Tenderness: There is no abdominal tenderness.  Musculoskeletal:        General: No deformity or signs of injury.     Cervical back: Neck supple.  Skin:    General: Skin is warm and dry.     Capillary Refill: Capillary refill takes less than 2 seconds.  Neurological:     General: No focal deficit present.     Mental Status: He is alert and oriented to person, place, and time.  Psychiatric:        Mood and Affect: Mood normal.        Behavior: Behavior normal.     ED Results / Procedures / Treatments   Labs (all labs ordered are listed, but only abnormal results are displayed) Labs Reviewed  CBC WITH DIFFERENTIAL/PLATELET - Abnormal; Notable for the following components:      Result Value   WBC 12.3 (*)    RBC 3.96 (*)    MCV 103.3 (*)    Neutro Abs 10.1 (*)    Monocytes Absolute 1.2 (*)    All other components within normal limits  BASIC METABOLIC PANEL  MAGNESIUM  TROPONIN I (HIGH SENSITIVITY)    EKG EKG Interpretation  Date/Time:  Tuesday July 27 2019 10:01:48 EDT Ventricular Rate:  108 PR Interval:    QRS Duration: 84 QT Interval:  276 QTC Calculation: 369 R Axis:   -4 Text Interpretation: Atrial flutter with variable A-V block Cannot rule out Anterior infarct , age undetermined Abnormal ECG Confirmed by Madalyn Rob [phone]) on 07/27/2019 10:31:03 AM   Radiology DG Chest 2 View  Result Date: 07/27/2019 CLINICAL DATA:  New onset a flutter EXAM: CHEST - 2 VIEW COMPARISON:  Thoracic  spine x-rays from July of 2020 FINDINGS: Cardiomediastinal contours are normal. The signs of aortic atherosclerosis in the aortic arch. Hilar structures are normal. Lungs are clear. No sign of pleural effusion. On limited assessment skeletal structures are unremarkable aside from degenerative changes in the shoulders and spine. IMPRESSION: No acute cardiopulmonary disease. Electronically Signed   By: Zetta Bills M.D.   On: 07/27/2019 11:01    Procedures Procedures (including critical care time)  Medications Ordered in ED Medications - No data to display  ED Course  I have reviewed the triage vital signs and the nursing notes.  Pertinent labs & imaging results that were available during my care of the patient were reviewed by me and considered in my medical decision making (see chart for details).    MDM Rules/Calculators/A&P                          74 year old male presenting to ER with concern for new onset A. Fib.  Patient here well-appearing with stable vital signs.  EKG demonstrating atrial flutter with variable conduction.  Given symptoms have been going on for the last few days, feel patient is outside of window to safely cardiovert in ER without being on anticoagulation.  Consulted cardiology for their recommendations.  While awaiting cardiology consultation, patient signed out to Dr. Darl Householder.  Final Clinical Impression(s) / ED Diagnoses Final diagnoses:  Atrial flutter, unspecified type [facility])    Rx / DC Orders ED Discharge Orders    None       Lucrezia Starch, MD 07/28/19 [phone]

## 2019-07-27 NOTE — H&P (Addendum)
Cardiology Admission History and Physical:   Patient ID: Carlos Moreno; MRN: [id number]; DOB: [birth year]   Admission date: 07/27/2019  Primary Care Provider: Crist Infante, MD Primary Cardiologist: Minus Breeding, MD 05/25/2019 Primary Electrophysiologist: None    Chief Complaint:  Atrial flutter  Patient Profile:   Carlos Moreno is a 74 y.o. 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  Carlos Moreno was in his usual state of health until Saturday.  He had onset of tachypalpitations during the day.  He felt it some, but was mostly aware when his watch told him he was in atrial fibrillation.  His heart rate was elevated.  Since that time, he has not felt well.  He denies shortness of breath or chest pain, but has been extremely fatigued.  He has not felt like doing anything.  He has not had lower extremity edema, no orthopnea or PND.  He has not been lightheaded or dizzy, no presyncope or syncope.  He does not have ongoing palpitations, but will be aware of his heart rate when it is very high.  He has never had this before.  He is very sure that it started on Saturday, this was confirmed by the fact that his heart rate went from normal to very fast and irregular according to his watch.  He checks his blood pressure 2 times a day and records, following this closely.  His blood pressure is a little bit lower than normal since being in atrial fibrillation.  Associated with the atrial fibrillation, his chest pressure.  It is mild, approximately a 2/10.  It is not severe pain.  He has not taken any nitroglycerin for it.  Prior to developing atrial fibrillation, he was not having this.  He does  not remember if chest pressure is the symptom that he was having prior to his last PCI.  He had a GI bleed in 2018, prior to moving to [place].  Because of his gastric bypass, they were not able to see a source of bleeding.  It did not show up on EGD or colonoscopy, but they could not get into the lower part of the stomach.  At that time, he was placed on Protonix twice daily and the bleeding eventually resolved.  He was on Protonix daily, but this was changed to Pepcid and Carafate.  He has no problems with indigestion or heartburn.  He has not seen any bleeding per rectum or dark tarry stools.    He has been on aspirin since 2005.  He was on Brilinta right after his last stent, but this was changed to Plavix and then later discontinued.  He was only on baby aspirin when he had the GI bleed.  Prior to the onset of the atrial fibrillation, he was doing well.  His last hemoglobin A1c was 6.2, it has been as high as 7.3.  He is not on a diabetic diet.  Dr. Joylene Draft put him on one of the SGLT2 inhibitors, but he wonders if he really needs it.   Past Medical History:  Diagnosis Date  . Anemia   . Atrial flutter with rapid ventricular response ([place]) 07/27/2019  . Basal cell carcinoma   .  CAD (coronary artery disease) 2005    PCI to mid LAD with a 3.0 x 15 mm Resolute Integrity DES May 2017. LAD Stent 2005 (Details not available)  . Cataract   . CKD stage 3 due to type 2 diabetes mellitus ([place]) 07/27/2019  . Curvature of spine   . Diabetes mellitus type 2, diet-controlled ([place]) 07/27/2019  . Gallstones   . Hypertension   . Kidney stones   . Macular degeneration   . Obesity   . OSA (obstructive sleep apnea)    CPAP    Past Surgical History:  Procedure Laterality Date  . ABDOMINOPLASTY  02/2009  . CATARACT EXTRACTION, BILATERAL  03/2007  . CHOLECYSTECTOMY  2001  . COLONOSCOPY  08/07 11/12 04/16   . CORONARY ANGIOPLASTY WITH STENT PLACEMENT  07/2003   Stent LAD, 06/2015 DES LAD  . HIP  SURGERY Left   . INGUINAL HERNIA REPAIR Left 1956  . KNEE ARTHROSCOPY Right 05/2005   x3  . LAMINECTOMY  2009   Lumbar  . REPLACEMENT TOTAL KNEE Left 1994  . REPLACEMENT TOTAL KNEE Right    x 3  . 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 TOTAL HIP ARTHROPLASTY Right 01/2007     Medications Prior to Admission: Prior to Admission medications   Medication Sig Start Date End Date Taking? Authorizing Provider  aspirin EC 81 MG tablet Take 81 mg by mouth daily. Swallow whole.   Yes [provider]  atorvastatin (LIPITOR) 40 MG tablet Take 40 mg by mouth daily. 12/19/16  Yes [provider]  b complex vitamins capsule Take 1 capsule by mouth every other day.    Yes [provider]  BYSTOLIC 5 MG tablet Take 5 mg by mouth daily. 12/19/16  Yes [provider]  Calcium-Phosphorus-Vitamin D (CALCIUM GUMMIES PO) Take 2 tablets by mouth daily. Takes two daily    Yes [provider]  cetirizine (ZYRTEC) 10 MG tablet Take 10 mg by mouth daily.   Yes [provider]  Cyanocobalamin (B-12) 5000 MCG SUBL Place 1 tablet under the tongue every other day.    Yes [provider]  famotidine (PEPCID) 20 MG tablet Take 1 tablet (20 mg total) by mouth 2 (two) times daily. 06/22/19  Yes Nandigam, Venia Minks, MD  isosorbide mononitrate (IMDUR) 30 MG 24 hr tablet Take 30 mg by mouth daily. 12/19/16  Yes [provider]  JARDIANCE 25 MG TABS tablet Take 25 mg by mouth daily.  06/08/18  Yes [provider]  losartan (COZAAR) 100 MG tablet Take 100 mg by mouth daily.   Yes [provider]  Melatonin ER 5 MG TBCR Take 5 mg by mouth every evening.    Yes [provider]  Multiple Vitamins-Minerals (VISION FORMULA/LUTEIN) TABS Take 1 tablet by mouth daily.  10/10/14  Yes [provider]  nitroGLYCERIN (NITROSTAT) 0.4 MG SL tablet Place 1 tablet (0.4 mg total) under the tongue every 5 (five) minutes as needed for chest  pain. 05/25/19 08/23/19 Yes HochreinJeneen Rinks, MD  Polysaccharide Iron Complex (IRON UP PO) Take 120 mg by mouth daily.   Yes [provider]  sucralfate (CARAFATE) 1 g tablet Take 1 tablet (1 g total) by mouth 2 (two) times daily as needed. 06/22/19  Yes Nandigam, Venia Minks, MD  tiZANidine (ZANAFLEX) 4 MG tablet Take 4 mg by mouth at bedtime. 03/30/19  Yes [provider]  TRADJENTA 5 MG TABS tablet Take 5 mg by mouth daily.  06/08/18  Yes [provider]  traMADol (ULTRAM) 50 MG tablet Take 50 mg by mouth every 6 (six) hours as needed for moderate pain.    Yes [provider]  vitamin C (ASCORBIC ACID) 500 MG tablet Take 500 mg by mouth daily.   Yes [provider]  zinc gluconate 50 MG tablet Take 50 mg by mouth daily.  03/17/14  Yes [provider]     Allergies:   No Known Allergies  Social History:   Social History   Socioeconomic History  . Marital status: Married    Spouse name: Not on file  . Number of children: 3  . Years of education: Not on file  . Highest education level: Not on file  Occupational History  . Occupation: Retired English as a second language teacher  Tobacco Use  . Smoking status: Former Smoker    Types: Pipe  . Smokeless tobacco: Never Used  Vaping Use  . Vaping Use: Never used  Substance and Sexual Activity  . Alcohol use: Yes    Comment: highball and wine daily  . Drug use: Never  . Sexual activity: Not on file  Other Topics Concern  . Not on file  Social History Narrative  . Not on file   Social Determinants of Health   Financial Resource Strain:   . Difficulty of Paying Living Expenses:   Food Insecurity:   . Worried About Charity fundraiser in the Last Year:   . Arboriculturist in the Last Year:   Transportation Needs:   . Film/video editor (Medical):   Marland Kitchen Lack of Transportation (Non-Medical):   Physical Activity:   . Days of Exercise per Week:   . Minutes of Exercise per Session:   Stress:   . Feeling of  Stress :   Social Connections:   . Frequency of Communication with Friends and Family:   . Frequency of Social Gatherings with Friends and Family:   . Attends Religious Services:   . Active Member of Clubs or Organizations:   . Attends Archivist Meetings:   Marland Kitchen Marital Status:   Intimate Partner Violence:   . Fear of Current or Ex-Partner:   . Emotionally Abused:   Marland Kitchen Physically Abused:   . Sexually Abused:     Family History:  The patient's family history includes Bladder Cancer in his mother; Colon cancer (age of onset: 25) in his father; Diabetes in his paternal grandmother; Rheum arthritis in his mother; Tuberculosis in his paternal grandfather. There is no history of Esophageal cancer or Rectal cancer.   The patient He indicated that his mother is deceased. He indicated that his father is deceased. He indicated that his sister is alive. He indicated that his maternal grandmother is deceased. He indicated that his maternal grandfather is deceased. He indicated that his paternal grandmother is deceased. He indicated that his paternal grandfather is deceased. He indicated that all of his three sons are alive. He indicated that the status of his neg hx is unknown.  ROS:  Please see the history of present illness.  All other ROS reviewed and negative.     Physical Exam/Data:   Vitals:   07/27/19 1300 07/27/19 1330 07/27/19 1400 07/27/19 1430  BP: 114/66 101/75 (!) 124/52 103/62  Pulse: 98 (!) 54 72 (!) 47  Resp: 20 (!) 23 16 16   SpO2: 94% 98% 98% 100%  Weight:      Height:       No intake or output data  in the 24 hours ending 07/27/19 1537 Filed Weights   07/27/19 1011  Weight: 115.3 kg   Wt Readings from Last 3 Encounters:  07/27/19 115.3 kg  06/22/19 117.7 kg  05/25/19 127.8 kg   Last Weight  Most recent update: 07/27/2019 10:13 AM   Weight  115.3 kg (254 lb 3.2 oz)           Body mass index is 37 kg/m.  General:  Well nourished, well developed, male in no  acute distress HEENT: normal Lymph: no adenopathy Neck:  JVD not elevated Endocrine:  No thryomegaly Vascular: No carotid bruits; 4/4 extremity pulses 2+ bilaterally  Cardiac:  normal S1, S2; irregular rate and rhythm, no murmur, no rub or gallop  Lungs:  clear to auscultation bilaterally, no wheezing, rhonchi or rales  Abd: soft, nontender, no hepatomegaly  Ext: No edema Musculoskeletal:  No deformities, BUE and BLE strength normal and equal Skin: warm and dry, chronic venous changes on both lower extremities Neuro:  CNs 2-12 intact, no focal abnormalities noted Psych:  Normal affect    EKG:  The ECG was personally reviewed: 6/15 ECG is typical atrial flutter, RVR, heart rate 105 Telemetry: Atrial flutter, variable rate, generally greater than 100  Relevant CV Studies: None in our system  Laboratory Data:  Chemistry Recent Labs  Lab 07/27/19 1102  NA 138  K 4.5  CL 112*  CO2 17*  GLUCOSE 194*  BUN 61*  CREATININE 2.18*  CALCIUM 8.9  GFRNONAA 29*  GFRAA 33*  ANIONGAP 9    No results for input(s): PROT, ALBUMIN, AST, ALT, ALKPHOS, BILITOT in the last 168 hours. Hematology Recent Labs  Lab 07/27/19 1102  WBC 12.3*  RBC 3.96*  HGB 13.2  HCT 40.9  MCV 103.3*  MCH 33.3  MCHC 32.3  RDW 13.1  PLT 179   Cardiac Enzymes  High Sensitivity Troponin:   Recent Labs  Lab 07/27/19 1102 07/27/19 1247  TROPONINIHS 18* 18*     BNPNo results for input(s): BNP, PROBNP in the last 168 hours.  DDimer No results for input(s): DDIMER in the last 168 hours. Lipids: No results found for: CHOL, HDL, LDLCALC, LDLDIRECT, TRIG, CHOLHDL INR: No results found for: INR, PROTIME A1c: No results found for: HGBA1C Thyroid: No results found for: TSH, T3TOTAL, T4TOTAL, THYROIDAB  Radiology/Studies:  DG Chest 2 View  Result Date: 07/27/2019 CLINICAL DATA:  New onset a flutter EXAM: CHEST - 2 VIEW COMPARISON:  Thoracic spine x-rays from July of 2020 FINDINGS: Cardiomediastinal  contours are normal. The signs of aortic atherosclerosis in the aortic arch. Hilar structures are normal. Lungs are clear. No sign of pleural effusion. On limited assessment skeletal structures are unremarkable aside from degenerative changes in the shoulders and spine. IMPRESSION: No acute cardiopulmonary disease. Electronically Signed   By: Zetta Bills M.D.   On: 07/27/2019 11:01    Assessment and Plan:   1.  Typical atrial flutter, RVR -He takes Bystolic 5 mg daily, the dose was reduced by Dr. Percival Spanish secondary to bradycardia -History of sinus bradycardia will limit any rate lowering medication we have he is to use, because of his history of coronary artery disease, would prefer beta-blocker -Explained to the patient and his wife that since he is symptomatic from the atrial fibrillation, we would want to try to get him back in atrial fibrillation. -I discussed TEE cardioversion, versus waiting 3 weeks and then doing cardioversion -I explained the CHA2DS2-VASc score and why he would need to  be on blood thinners in order to get cardioversion -He would have to continue the GI medications and be monitored carefully for bleeding when on blood thinners. - After careful review of history and examination, the risks and benefits of transesophageal echocardiogram have been explained including risks of esophageal damage, perforation (1:10,000 risk), bleeding, pharyngeal hematoma as well as other potential complications associated with conscious sedation including aspiration, arrhythmia, respiratory failure and death. Alternatives to treatment were discussed, questions were answered.  -Check echo, check TSH  2.  CAD: -He is compliant with medications, continue ASA, beta-blocker, Imdur, losartan, statin -Monitor for symptoms  3.  Diabetes: -He wants to know if he is a diabetic, I explained that at this time, he is a diet-controlled diabetic -Encouraged him to stick to a low sugar, low carbohydrate  diet -Explained that an SGLT2 inhibitor is an appropriate medication for him, agree with Dr. Silvestre Mesi recommendations -hold   4.  CKD III due to DM 2 -01/2017, BUN was 10 with a creatinine 1.21 -04/12/2019, BUN 34, creatinine 1.90 -07/27/2019, BUN 61 creatinine 2.18 - hold Jardiance at home dose, hold losartan  Rosaria Ferries, PA-C 07/27/2019 3:37 PM   Principal Problem:   Atrial flutter with rapid ventricular response (HCC) Active Problems:   Coronary artery disease involving native heart without angina pectoris   Essential hypertension   Diabetes mellitus type 2, diet-controlled ([place])   CKD stage 3 due to type 2 diabetes mellitus ([place])   For questions or updates, please contact [facility] Please consult [email or web address] for contact info under Cardiology/STEMI.    Signed, Rosaria Ferries, PA-C  07/27/2019 3:37 PM   Patient seen and examined. Agree with assessment and plan.  Carlos Moreno is a 74 year old gentleman who has a history of morbid obesity and is status post gastric bypass Roux-en-Y surgery in 2008.  Prior to surgery he was diabetic subsequently resolved following the Roux-en-Y procedure.  He has known CAD and has undergone prior stenting x2 to his LAD in 2005 in 2017.  The time he had 50% ostial stenosis in his circumflex marginal vessel.  He has a history of obstructive sleep apnea since 1994 and apparently is on a very small travel CPAP unit he believes his pressure set at 9 cm.  He has not had any sleep apnea follow-up or subsequent AHI determination to see if he is an optimal pressure.  Is had multiple musculoskeletal issues with bilateral knee replacements and hip replacement.  He recently had undergone 2 weeks in a row of steroid injections.  Last Friday evening he had an episode of protracted hiccups.  On Saturday, his watch identified that he was no longer in a normal rhythm and suggested that he was in atrial fibrillation.  He admits to now awareness of heart rate  irregularity since being identified over the weekend.  He has been on chronic aspirin therapy since 2005 and remotely had been on DAPT.  Presently he denies chest pain.  He denies PND orthopnea.  He had a history of significant lower extremity edema but denies recent edema but admits to chronic venous stasis changes.  He was started on Jardiance per Dr. Haynes Kerns with a hemoglobin A1c of 6.2.  He also is on Tradjenta.  On exam he is obese with a BMI presently at 38.  HEENT is unremarkable.  He has very thick neck.  He was wearing a mask and Mallampati scale was not assessed.  Nose were clear.  Rhythm was regular.  There was  a very faint systolic murmur no S3 or S4 gallop.  There was central adiposity.  Abdomen was nontender.  Lower extremity was notable for bilateral venous stasis changes without edema.  Neurologically he was nonfocal.  He had normal affect and mood.  His ECG confirms atrial flutter with variable block ventricular rate at 108.  There is LVH by voltage.  QTc interval 369 ms..  Auditory is notable for a glucose of 194, BUN 61 creatinine 2.18 magnesium 2.5.  Estimated GFR 29 consistent with borderline stage IV chronic kidney disease.  Hemoglobin 13.2, hematocrit 40.9, MCV 103 consistent with macrocytic indices.  High-sensitivity troponin 18.  Will admit the and initiate Eliquis anticoagulation.  The patient's Bystolic had recently been decreased from 10 mg to 5 mg at his last visit with Dr. Percival Spanish.  Will increase beta-blocker therapy and resume 10 mg daily commencing this evening.  Plan to check B12 and folate levels with macrocytosis.  Will obtain 2D echo Doppler study.  With worsening renal function will discontinue Jardiance since they may have contributed to his prerenal azotemia.  Will hold losartan presently with his creatinine at 2.18.  He denies any anginal symptoms.  We will cycle troponin.  After 48 hours of Eliquis consider TEE guided cardioversion if he is still in atrial flutter.  I  discussed at some point he should have assessment of sleep apnea to make certain his AHI is well treated on his current low 9 cm water pressure setting.    Troy Sine, MD, [facility] 07/27/2019 4:23 PM

## 2019-07-28 ENCOUNTER — Observation Stay (HOSPITAL_COMMUNITY): Payer: Medicare Other

## 2019-07-28 DIAGNOSIS — I483 Typical atrial flutter: Principal | ICD-10-CM

## 2019-07-28 DIAGNOSIS — E785 Hyperlipidemia, unspecified: Secondary | ICD-10-CM | POA: Diagnosis not present

## 2019-07-28 DIAGNOSIS — I4892 Unspecified atrial flutter: Secondary | ICD-10-CM | POA: Diagnosis present

## 2019-07-28 DIAGNOSIS — Z955 Presence of coronary angioplasty implant and graft: Secondary | ICD-10-CM | POA: Diagnosis not present

## 2019-07-28 DIAGNOSIS — Z96653 Presence of artificial knee joint, bilateral: Secondary | ICD-10-CM | POA: Diagnosis present

## 2019-07-28 DIAGNOSIS — Z6837 Body mass index (BMI) 37.0-37.9, adult: Secondary | ICD-10-CM | POA: Diagnosis not present

## 2019-07-28 DIAGNOSIS — Z800 Family history of malignant neoplasm of digestive organs: Secondary | ICD-10-CM | POA: Diagnosis not present

## 2019-07-28 DIAGNOSIS — E1122 Type 2 diabetes mellitus with diabetic chronic kidney disease: Secondary | ICD-10-CM | POA: Diagnosis not present

## 2019-07-28 DIAGNOSIS — Z9884 Bariatric surgery status: Secondary | ICD-10-CM | POA: Diagnosis not present

## 2019-07-28 DIAGNOSIS — H3530 Unspecified macular degeneration: Secondary | ICD-10-CM | POA: Diagnosis present

## 2019-07-28 DIAGNOSIS — Z20822 Contact with and (suspected) exposure to covid-19: Secondary | ICD-10-CM | POA: Diagnosis not present

## 2019-07-28 DIAGNOSIS — Z7982 Long term (current) use of aspirin: Secondary | ICD-10-CM | POA: Diagnosis not present

## 2019-07-28 DIAGNOSIS — I340 Nonrheumatic mitral (valve) insufficiency: Secondary | ICD-10-CM | POA: Diagnosis not present

## 2019-07-28 DIAGNOSIS — Z7984 Long term (current) use of oral hypoglycemic drugs: Secondary | ICD-10-CM | POA: Diagnosis not present

## 2019-07-28 DIAGNOSIS — E119 Type 2 diabetes mellitus without complications: Secondary | ICD-10-CM | POA: Diagnosis not present

## 2019-07-28 DIAGNOSIS — G4733 Obstructive sleep apnea (adult) (pediatric): Secondary | ICD-10-CM | POA: Diagnosis not present

## 2019-07-28 DIAGNOSIS — R001 Bradycardia, unspecified: Secondary | ICD-10-CM | POA: Diagnosis not present

## 2019-07-28 DIAGNOSIS — I2510 Atherosclerotic heart disease of native coronary artery without angina pectoris: Secondary | ICD-10-CM | POA: Diagnosis not present

## 2019-07-28 DIAGNOSIS — Z96641 Presence of right artificial hip joint: Secondary | ICD-10-CM | POA: Diagnosis present

## 2019-07-28 DIAGNOSIS — N184 Chronic kidney disease, stage 4 (severe): Secondary | ICD-10-CM | POA: Diagnosis not present

## 2019-07-28 DIAGNOSIS — Z8719 Personal history of other diseases of the digestive system: Secondary | ICD-10-CM | POA: Diagnosis not present

## 2019-07-28 DIAGNOSIS — E669 Obesity, unspecified: Secondary | ICD-10-CM | POA: Diagnosis present

## 2019-07-28 DIAGNOSIS — I4891 Unspecified atrial fibrillation: Secondary | ICD-10-CM | POA: Diagnosis present

## 2019-07-28 DIAGNOSIS — Z79899 Other long term (current) drug therapy: Secondary | ICD-10-CM | POA: Diagnosis not present

## 2019-07-28 DIAGNOSIS — M1990 Unspecified osteoarthritis, unspecified site: Secondary | ICD-10-CM | POA: Diagnosis not present

## 2019-07-28 DIAGNOSIS — Z9049 Acquired absence of other specified parts of digestive tract: Secondary | ICD-10-CM | POA: Diagnosis not present

## 2019-07-28 DIAGNOSIS — I10 Essential (primary) hypertension: Secondary | ICD-10-CM | POA: Diagnosis not present

## 2019-07-28 DIAGNOSIS — N183 Chronic kidney disease, stage 3 unspecified: Secondary | ICD-10-CM

## 2019-07-28 DIAGNOSIS — I129 Hypertensive chronic kidney disease with stage 1 through stage 4 chronic kidney disease, or unspecified chronic kidney disease: Secondary | ICD-10-CM | POA: Diagnosis not present

## 2019-07-28 LAB — COMPREHENSIVE METABOLIC PANEL
ALT: 39 U/L (ref 0–44)
AST: 16 U/L (ref 15–41)
Albumin: 3.4 g/dL — ABNORMAL LOW (ref 3.5–5.0)
Alkaline Phosphatase: 64 U/L (ref 38–126)
Anion gap: 10 (ref 5–15)
BUN: 51 mg/dL — ABNORMAL HIGH (ref 8–23)
CO2: 20 mmol/L — ABNORMAL LOW (ref 22–32)
Calcium: 9.2 mg/dL (ref 8.9–10.3)
Chloride: 110 mmol/L (ref 98–111)
Creatinine, Ser: 1.84 mg/dL — ABNORMAL HIGH (ref 0.61–1.24)
GFR calc Af Amer: 41 mL/min — ABNORMAL LOW (ref >60)
GFR calc non Af Amer: 35 mL/min — ABNORMAL LOW (ref >60)
Glucose, Bld: 154 mg/dL — ABNORMAL HIGH (ref 70–99)
Potassium: 5.3 mmol/L — ABNORMAL HIGH (ref 3.5–5.1)
Sodium: 140 mmol/L (ref 135–145)
Total Bilirubin: 1.8 mg/dL — ABNORMAL HIGH (ref 0.3–1.2)
Total Protein: 6.2 g/dL — ABNORMAL LOW (ref 6.5–8.1)

## 2019-07-28 LAB — GLUCOSE, CAPILLARY
Glucose-Capillary: 143 mg/dL — ABNORMAL HIGH (ref 70–99)
Glucose-Capillary: 160 mg/dL — ABNORMAL HIGH (ref 70–99)
Glucose-Capillary: 205 mg/dL — ABNORMAL HIGH (ref 70–99)
Glucose-Capillary: 126 mg/dL — ABNORMAL HIGH (ref 70–99)

## 2019-07-28 LAB — MAGNESIUM: Magnesium: 2.3 mg/dL (ref 1.7–2.4)

## 2019-07-28 MED ORDER — APIXABAN 5 MG PO TABS
5.0000 mg | ORAL_TABLET | Freq: Two times a day (BID) | ORAL | Status: DC
  Administered 2019-07-28 – 2019-07-29 (×4): 5 mg via ORAL
  Filled 2019-07-28 (×4): qty 1

## 2019-07-28 MED ORDER — APIXABAN 5 MG PO TABS: 5.0000 mg | ORAL_TABLET | Freq: Two times a day (BID) | ORAL | Status: DC

## 2019-07-28 NOTE — TOC Benefit Eligibility Note (Signed)
Transition of Care [facility]) Benefit Eligibility Note    Patient Details  Name: Carlos Moreno MRN: [id number] Date of Birth: [birth year]   Medication/Dose: Alveda Reasons  20 MG BID     RIVAROXABAN: NON-FORMULARY  Covered?: Yes  Tier: 3 Drug  Prescription Coverage Preferred Pharmacy: [facility]  Spoke with Person/Company/Phone Number:: MOURA  @ [employer] # [phone]  Co-Pay: $131.74  Prior Approval: No  Deductible: Unmet (OUT-OF-POCKET: UNMET)  Additional Notes: ELIQUIS  5 MG BID   COVER- YES  CO-PAY- $133.50   TIER- 3 DRUG  P/A NO    APIXABAN : NON-FORMULARY    Memory Argue Phone Number: 07/28/2019, 1:23 PM

## 2019-07-28 NOTE — Discharge Instructions (Signed)

## 2019-07-28 NOTE — Progress Notes (Signed)
Progress Note  Patient Name: Carlos Moreno Date of Encounter: 07/28/2019  [facility] Cardiologist: Minus Breeding, MD   Subjective   Feels short of breath, fatigued. No chest pain. Notes that his watch has been alerting him to irregular rhythms.  We discussed anticoagulation, TEE-CV today.  History of bleeds, one GI, one unclear etiology. Has required transfusions in the past. Most severe episode 01/2017, required high volume blood transfusion. Reports that he had endoscopy at that time, no clear cause, resolved with protonix. Recent upper and lower GI endoscopy reviewed in chart. Does has prior history of gastric bypass surgery. No melena or hematochezia. Recent Hgb has been stable, no current GI symptoms.  Inpatient Medications    Scheduled Meds: . apixaban  5 mg Oral BID  . vitamin C  500 mg Oral Daily  . aspirin EC  81 mg Oral Daily  . atorvastatin  40 mg Oral Daily  . calcium-vitamin D  2 tablet Oral Daily  . famotidine  20 mg Oral Daily  . insulin aspart  0-5 Units Subcutaneous QHS  . insulin aspart  0-9 Units Subcutaneous TID WC  . isosorbide mononitrate  30 mg Oral Daily  . linagliptin  5 mg Oral Daily  . loratadine  10 mg Oral Daily  . melatonin  5 mg Oral QPM  . nebivolol  10 mg Oral Daily  . sodium chloride flush  3 mL Intravenous Q12H  . sucralfate  1 g Oral BID  . tiZANidine  4 mg Oral QHS  . vitamin B-12  5,000 mcg Oral QODAY   Continuous Infusions: . sodium chloride    . sodium chloride 20 mL/hr at 07/28/19 0628   PRN Meds: sodium chloride, acetaminophen, ALPRAZolam, nitroGLYCERIN, ondansetron (ZOFRAN) IV, sodium chloride flush, traMADol, zolpidem   Vital Signs    Vitals:   07/27/19 2053 07/28/19 0025 07/28/19 0512 07/28/19 1112  BP: 122/72 93/64 112/71 135/86  Pulse: 83 70 69 81  Resp: 17 15 14 20   Temp: 98.1 F (36.7 C) 97.7 F (36.5 C) 97.8 F (36.6 C) 97.9 F (36.6 C)  TempSrc: Oral Oral Oral Oral  SpO2: 98% 97% 98% 100%  Weight:    111.7 kg   Height:        Intake/Output Summary (Last 24 hours) at 07/28/2019 1202 Last data filed at 07/28/2019 1026 Gross per 24 hour  Intake 360 ml  Output 1700 ml  Net -1340 ml   Last 3 Weights 07/28/2019 07/27/2019 06/22/2019  Weight (lbs) 246 lb 3.2 oz 254 lb 3.2 oz 259 lb 6 oz  Weight (kg) 111.676 kg 115.304 kg 117.652 kg      Telemetry    Atrial flutter - Personally Reviewed  ECG    Rate controlled atrial flutter - Personally Reviewed  Physical Exam   GEN: No acute distress.   Neck: No JVD Cardiac: largely regular S1 and S2, no murmurs, rubs, or gallops.  Respiratory: Clear to auscultation bilaterally. GI: Soft, nontender, non-distended  MS: No edema; No deformity. Chronic skin discoloration lower extremities Neuro:  Nonfocal  Psych: Normal affect   Labs    High Sensitivity Troponin:   Recent Labs  Lab 07/27/19 1102 07/27/19 1247 07/27/19 1648 07/27/19 1855  TROPONINIHS 18* 18* 16 17      Chemistry Recent Labs  Lab 07/27/19 1102 07/28/19 0424  NA 138 140  K 4.5 5.3*  CL 112* 110  CO2 17* 20*  GLUCOSE 194* 154*  BUN 61* 51*  CREATININE 2.18* 1.84*  CALCIUM 8.9 9.2  PROT  --  6.2*  ALBUMIN  --  3.4*  AST  --  16  ALT  --  39  ALKPHOS  --  64  BILITOT  --  1.8*  GFRNONAA 29* 35*  GFRAA 33* 41*  ANIONGAP 9 10     Hematology Recent Labs  Lab 07/27/19 1102  WBC 12.3*  RBC 3.96*  HGB 13.2  HCT 40.9  MCV 103.3*  MCH 33.3  MCHC 32.3  RDW 13.1  PLT 179    BNPNo results for input(s): BNP, PROBNP in the last 168 hours.   DDimer No results for input(s): DDIMER in the last 168 hours.   Radiology    DG Chest 2 View  Result Date: 07/27/2019 CLINICAL DATA:  New onset a flutter EXAM: CHEST - 2 VIEW COMPARISON:  Thoracic spine x-rays from July of 2020 FINDINGS: Cardiomediastinal contours are normal. The signs of aortic atherosclerosis in the aortic arch. Hilar structures are normal. Lungs are clear. No sign of pleural effusion. On limited  assessment skeletal structures are unremarkable aside from degenerative changes in the shoulders and spine. IMPRESSION: No acute cardiopulmonary disease. Electronically Signed   By: Zetta Bills M.D.   On: 07/27/2019 11:01    Cardiac Studies   Echo pending  Patient Profile     74 y.o. 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  Assessment & Plan    Typical atrial flutter -rate controlled, history of bradycardia makes further titration difficult -CHA2DS2/VAS Stroke Risk Points=4 -we discussed his bleeding history. Nothing recent, had recent upper and lower GI endoscopy without issues -will plan for 3 doses of apixaban, then TEE-CV tomorrow. All questions answered, consented yesterday for this. -echo pending   CAD: no symptoms -continue medical management with atorvastatin, beta blocker, imdur -last stent 2017, given prior GI bleed will stop aspirin since starting DOAC  Type II diabetes, diet controlled -agree with recommendations re: SGLT2i given CAD and chronic kidney disease, though on hold with recent increase in Cr. -tradjenta continued -SSI and glucose checks  OSA on CPAP: -wife bringing CPAP from home  For questions or updates, please contact [facility] Please consult [email or web address] for contact info under     Signed, Buford Dresser, MD  07/28/2019, 12:02 PM

## 2019-07-28 NOTE — Progress Notes (Signed)
Patient on home CPAP.  RT assistance not needed at this time.

## 2019-07-28 NOTE — Care Management (Signed)
Benefit check sent for Eliquis and Carlos Moreno

## 2019-07-28 NOTE — H&P (View-Only) (Signed)
Progress Note  Patient Name: Carlos Moreno Date of Encounter: 07/28/2019  [facility] Cardiologist: Minus Breeding, MD   Subjective   Feels short of breath, fatigued. No chest pain. Notes that his watch has been alerting him to irregular rhythms.  We discussed anticoagulation, TEE-CV today.  History of bleeds, one GI, one unclear etiology. Has required transfusions in the past. Most severe episode 01/2017, required high volume blood transfusion. Reports that he had endoscopy at that time, no clear cause, resolved with protonix. Recent upper and lower GI endoscopy reviewed in chart. Does has prior history of gastric bypass surgery. No melena or hematochezia. Recent Hgb has been stable, no current GI symptoms.  Inpatient Medications    Scheduled Meds: . apixaban  5 mg Oral BID  . vitamin C  500 mg Oral Daily  . aspirin EC  81 mg Oral Daily  . atorvastatin  40 mg Oral Daily  . calcium-vitamin D  2 tablet Oral Daily  . famotidine  20 mg Oral Daily  . insulin aspart  0-5 Units Subcutaneous QHS  . insulin aspart  0-9 Units Subcutaneous TID WC  . isosorbide mononitrate  30 mg Oral Daily  . linagliptin  5 mg Oral Daily  . loratadine  10 mg Oral Daily  . melatonin  5 mg Oral QPM  . nebivolol  10 mg Oral Daily  . sodium chloride flush  3 mL Intravenous Q12H  . sucralfate  1 g Oral BID  . tiZANidine  4 mg Oral QHS  . vitamin B-12  5,000 mcg Oral QODAY   Continuous Infusions: . sodium chloride    . sodium chloride 20 mL/hr at 07/28/19 0628   PRN Meds: sodium chloride, acetaminophen, ALPRAZolam, nitroGLYCERIN, ondansetron (ZOFRAN) IV, sodium chloride flush, traMADol, zolpidem   Vital Signs    Vitals:   07/27/19 2053 07/28/19 0025 07/28/19 0512 07/28/19 1112  BP: 122/72 93/64 112/71 135/86  Pulse: 83 70 69 81  Resp: 17 15 14 20   Temp: 98.1 F (36.7 C) 97.7 F (36.5 C) 97.8 F (36.6 C) 97.9 F (36.6 C)  TempSrc: Oral Oral Oral Oral  SpO2: 98% 97% 98% 100%  Weight:    111.7 kg   Height:        Intake/Output Summary (Last 24 hours) at 07/28/2019 1202 Last data filed at 07/28/2019 1026 Gross per 24 hour  Intake 360 ml  Output 1700 ml  Net -1340 ml   Last 3 Weights 07/28/2019 07/27/2019 06/22/2019  Weight (lbs) 246 lb 3.2 oz 254 lb 3.2 oz 259 lb 6 oz  Weight (kg) 111.676 kg 115.304 kg 117.652 kg      Telemetry    Atrial flutter - Personally Reviewed  ECG    Rate controlled atrial flutter - Personally Reviewed  Physical Exam   GEN: No acute distress.   Neck: No JVD Cardiac: largely regular S1 and S2, no murmurs, rubs, or gallops.  Respiratory: Clear to auscultation bilaterally. GI: Soft, nontender, non-distended  MS: No edema; No deformity. Chronic skin discoloration lower extremities Neuro:  Nonfocal  Psych: Normal affect   Labs    High Sensitivity Troponin:   Recent Labs  Lab 07/27/19 1102 07/27/19 1247 07/27/19 1648 07/27/19 1855  TROPONINIHS 18* 18* 16 17      Chemistry Recent Labs  Lab 07/27/19 1102 07/28/19 0424  NA 138 140  K 4.5 5.3*  CL 112* 110  CO2 17* 20*  GLUCOSE 194* 154*  BUN 61* 51*  CREATININE 2.18* 1.84*  CALCIUM 8.9 9.2  PROT  --  6.2*  ALBUMIN  --  3.4*  AST  --  16  ALT  --  39  ALKPHOS  --  64  BILITOT  --  1.8*  GFRNONAA 29* 35*  GFRAA 33* 41*  ANIONGAP 9 10     Hematology Recent Labs  Lab 07/27/19 1102  WBC 12.3*  RBC 3.96*  HGB 13.2  HCT 40.9  MCV 103.3*  MCH 33.3  MCHC 32.3  RDW 13.1  PLT 179    BNPNo results for input(s): BNP, PROBNP in the last 168 hours.   DDimer No results for input(s): DDIMER in the last 168 hours.   Radiology    DG Chest 2 View  Result Date: 07/27/2019 CLINICAL DATA:  New onset a flutter EXAM: CHEST - 2 VIEW COMPARISON:  Thoracic spine x-rays from July of 2020 FINDINGS: Cardiomediastinal contours are normal. The signs of aortic atherosclerosis in the aortic arch. Hilar structures are normal. Lungs are clear. No sign of pleural effusion. On limited  assessment skeletal structures are unremarkable aside from degenerative changes in the shoulders and spine. IMPRESSION: No acute cardiopulmonary disease. Electronically Signed   By: Zetta Bills M.D.   On: 07/27/2019 11:01    Cardiac Studies   Echo pending  Patient Profile     74 y.o. 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  Assessment & Plan    Typical atrial flutter -rate controlled, history of bradycardia makes further titration difficult -CHA2DS2/VAS Stroke Risk Points=4 -we discussed his bleeding history. Nothing recent, had recent upper and lower GI endoscopy without issues -will plan for 3 doses of apixaban, then TEE-CV tomorrow. All questions answered, consented yesterday for this. -echo pending   CAD: no symptoms -continue medical management with atorvastatin, beta blocker, imdur -last stent 2017, given prior GI bleed will stop aspirin since starting DOAC  Type II diabetes, diet controlled -agree with recommendations re: SGLT2i given CAD and chronic kidney disease, though on hold with recent increase in Cr. -tradjenta continued -SSI and glucose checks  OSA on CPAP: -wife bringing CPAP from home  For questions or updates, please contact [facility] Please consult [email or web address] for contact info under     Signed, Buford Dresser, MD  07/28/2019, 12:02 PM

## 2019-07-29 ENCOUNTER — Encounter (HOSPITAL_COMMUNITY)
Admission: AD | Disposition: A | Discharge status: Home / Self Care | Payer: Self-pay | Source: Home / Self Care | Attending: Cardiology

## 2019-07-29 ENCOUNTER — Encounter (HOSPITAL_COMMUNITY): Payer: Self-pay | Admitting: Cardiovascular Disease

## 2019-07-29 ENCOUNTER — Inpatient Hospital Stay (HOSPITAL_COMMUNITY): Payer: Medicare Other | Admitting: Certified Registered Nurse Anesthetist

## 2019-07-29 ENCOUNTER — Inpatient Hospital Stay (HOSPITAL_COMMUNITY): Payer: Medicare Other

## 2019-07-29 DIAGNOSIS — I340 Nonrheumatic mitral (valve) insufficiency: Secondary | ICD-10-CM

## 2019-07-29 DIAGNOSIS — I4892 Unspecified atrial flutter: Secondary | ICD-10-CM

## 2019-07-29 HISTORY — PX: CARDIOVERSION: SHX1299

## 2019-07-29 HISTORY — PX: TEE WITHOUT CARDIOVERSION: SHX5443

## 2019-07-29 LAB — BASIC METABOLIC PANEL
Anion gap: 9 (ref 5–15)
BUN: 42 mg/dL — ABNORMAL HIGH (ref 8–23)
CO2: 22 mmol/L (ref 22–32)
Calcium: 9.7 mg/dL (ref 8.9–10.3)
Chloride: 109 mmol/L (ref 98–111)
Creatinine, Ser: 1.76 mg/dL — ABNORMAL HIGH (ref 0.61–1.24)
GFR calc Af Amer: 43 mL/min — ABNORMAL LOW (ref >60)
GFR calc non Af Amer: 37 mL/min — ABNORMAL LOW (ref >60)
Glucose, Bld: 149 mg/dL — ABNORMAL HIGH (ref 70–99)
Potassium: 5.9 mmol/L — ABNORMAL HIGH (ref 3.5–5.1)
Sodium: 140 mmol/L (ref 135–145)

## 2019-07-29 LAB — POCT I-STAT, CHEM 8
BUN: 53 mg/dL — ABNORMAL HIGH (ref 8–23)
Calcium, Ion: 1.18 mmol/L (ref 1.15–1.40)
Chloride: 110 mmol/L (ref 98–111)
Creatinine, Ser: 1.7 mg/dL — ABNORMAL HIGH (ref 0.61–1.24)
Glucose, Bld: 157 mg/dL — ABNORMAL HIGH (ref 70–99)
HCT: 42 % (ref 39.0–52.0)
Hemoglobin: 14.3 g/dL (ref 13.0–17.0)
Potassium: 4.8 mmol/L (ref 3.5–5.1)
Sodium: 140 mmol/L (ref 135–145)
TCO2: 22 mmol/L (ref 22–32)

## 2019-07-29 LAB — ECHOCARDIOGRAM COMPLETE
Height: 69.5 in
Weight: 3929.48 oz

## 2019-07-29 LAB — CBC
HCT: 42.4 % (ref 39.0–52.0)
Hemoglobin: 14.1 g/dL (ref 13.0–17.0)
MCH: 34.1 pg — ABNORMAL HIGH (ref 26.0–34.0)
MCHC: 33.3 g/dL (ref 30.0–36.0)
MCV: 102.7 fL — ABNORMAL HIGH (ref 80.0–100.0)
Platelets: 212 10*3/uL (ref 150–400)
RBC: 4.13 MIL/uL — ABNORMAL LOW (ref 4.22–5.81)
RDW: 13.2 % (ref 11.5–15.5)
WBC: 7.6 10*3/uL (ref 4.0–10.5)
nRBC: 0 % (ref 0.0–0.2)

## 2019-07-29 LAB — GLUCOSE, CAPILLARY
Glucose-Capillary: 162 mg/dL — ABNORMAL HIGH (ref 70–99)
Glucose-Capillary: 128 mg/dL — ABNORMAL HIGH (ref 70–99)
Glucose-Capillary: 195 mg/dL — ABNORMAL HIGH (ref 70–99)

## 2019-07-29 SURGERY — ECHOCARDIOGRAM, TRANSESOPHAGEAL
Anesthesia: General

## 2019-07-29 MED ORDER — APIXABAN 5 MG PO TABS: 5.0000 mg | ORAL_TABLET | Freq: Two times a day (BID) | ORAL | 11 refills | Status: DC

## 2019-07-29 MED ORDER — PROPOFOL 500 MG/50ML IV EMUL
INTRAVENOUS | Status: DC | PRN
  Administered 2019-07-29: 100 ug/kg/min via INTRAVENOUS

## 2019-07-29 MED ORDER — ONDANSETRON HCL 4 MG/2ML IJ SOLN
INTRAMUSCULAR | Status: DC | PRN
  Administered 2019-07-29: 4 mg via INTRAVENOUS

## 2019-07-29 MED ORDER — PHENYLEPHRINE HCL (PRESSORS) 10 MG/ML IV SOLN
INTRAVENOUS | Status: DC | PRN
  Administered 2019-07-29 (×2): 80 ug via INTRAVENOUS
  Administered 2019-07-29: 120 ug via INTRAVENOUS

## 2019-07-29 MED ORDER — BYSTOLIC 5 MG PO TABS: 10.0000 mg | ORAL_TABLET | Freq: Every day | ORAL | 3 refills | Status: DC

## 2019-07-29 MED ORDER — EPHEDRINE SULFATE 50 MG/ML IJ SOLN
INTRAMUSCULAR | Status: DC | PRN
  Administered 2019-07-29: 5 mg via INTRAVENOUS

## 2019-07-29 MED ORDER — PROPOFOL 10 MG/ML IV BOLUS
INTRAVENOUS | Status: DC | PRN
  Administered 2019-07-29: 20 mg via INTRAVENOUS

## 2019-07-29 MED ORDER — JARDIANCE 25 MG PO TABS: 25.0000 mg | ORAL_TABLET | Freq: Every day | ORAL | Status: AC

## 2019-07-29 NOTE — Progress Notes (Signed)
Progress Note  Patient Name: Carlos Moreno Date of Encounter: 07/29/2019  [facility] Cardiologist: Minus Breeding, MD   Subjective   Seen post cardioversion. Doing well. Remains in sinus rhythm.   Inpatient Medications    Scheduled Meds: . apixaban  5 mg Oral BID  . vitamin C  500 mg Oral Daily  . atorvastatin  40 mg Oral Daily  . calcium-vitamin D  2 tablet Oral Daily  . famotidine  20 mg Oral Daily  . insulin aspart  0-5 Units Subcutaneous QHS  . insulin aspart  0-9 Units Subcutaneous TID WC  . isosorbide mononitrate  30 mg Oral Daily  . linagliptin  5 mg Oral Daily  . loratadine  10 mg Oral Daily  . melatonin  5 mg Oral QPM  . nebivolol  10 mg Oral Daily  . sodium chloride flush  3 mL Intravenous Q12H  . sucralfate  1 g Oral BID  . tiZANidine  4 mg Oral QHS  . vitamin B-12  5,000 mcg Oral QODAY   Continuous Infusions: . sodium chloride     PRN Meds: sodium chloride, acetaminophen, ALPRAZolam, nitroGLYCERIN, ondansetron (ZOFRAN) IV, sodium chloride flush, traMADol, zolpidem   Vital Signs    Vitals:   07/29/19 1240 07/29/19 1250 07/29/19 1300 07/29/19 1316  BP: (!) 91/34 (!) 90/46 (!) 103/53 112/67  Pulse: (!) 55 (!) 51 (!) 57 (!) 51  Resp: 15 20 19 20   Temp:    97.7 F (36.5 C)  TempSrc:    Oral  SpO2: 100% 100% 97% 100%  Weight:      Height:        Intake/Output Summary (Last 24 hours) at 07/29/2019 1636 Last data filed at 07/29/2019 1500 Gross per 24 hour  Intake 1040 ml  Output 750 ml  Net 290 ml   Last 3 Weights 07/29/2019 07/29/2019 07/28/2019  Weight (lbs) 245 lb 9.5 oz 245 lb 9.6 oz 246 lb 3.2 oz  Weight (kg) 111.4 kg 111.403 kg 111.676 kg      Telemetry    Atrial flutter, then sinus bradycardia post cardioversion - Personally Reviewed  ECG    Post cardioversion, sinus bradycardia at 53 bpm - Personally Reviewed  Physical Exam   GEN: Well nourished, well developed in no acute distress HEENT: Normal, moist mucous membranes NECK:  No JVD CARDIAC: regular rhythm, normal S1 and S2, no rubs or gallops. No murmur. VASCULAR: Radial and DP pulses 2+ bilaterally. No carotid bruits RESPIRATORY:  Clear to auscultation without rales, wheezing or rhonchi  ABDOMEN: Soft, non-tender, non-distended MUSCULOSKELETAL:  Moves all 4 limbs independently SKIN: Warm and dry, no edema NEUROLOGIC:  Alert and oriented x 3. No focal neuro deficits noted. PSYCHIATRIC:  Normal affect   Labs    High Sensitivity Troponin:   Recent Labs  Lab 07/27/19 1102 07/27/19 1247 07/27/19 1648 07/27/19 1855  TROPONINIHS 18* 18* 16 17      Chemistry Recent Labs  Lab 07/27/19 1102 07/27/19 1102 07/28/19 0424 07/29/19 0850 07/29/19 1130  NA 138   < > 140 140 140  K 4.5   < > 5.3* 5.9* 4.8  CL 112*   < > 110 109 110  CO2 17*  --  20* 22  --   GLUCOSE 194*   < > 154* 149* 157*  BUN 61*   < > 51* 42* 53*  CREATININE 2.18*   < > 1.84* 1.76* 1.70*  CALCIUM 8.9  --  9.2 9.7  --  PROT  --   --  6.2*  --   --   ALBUMIN  --   --  3.4*  --   --   AST  --   --  16  --   --   ALT  --   --  39  --   --   ALKPHOS  --   --  64  --   --   BILITOT  --   --  1.8*  --   --   GFRNONAA 29*  --  35* 37*  --   GFRAA 33*  --  41* 43*  --   ANIONGAP 9  --  10 9  --    < > = values in this interval not displayed.     Hematology Recent Labs  Lab 07/27/19 1102 07/29/19 0850 07/29/19 1130  WBC 12.3* 7.6  --   RBC 3.96* 4.13*  --   HGB 13.2 14.1 14.3  HCT 40.9 42.4 42.0  MCV 103.3* 102.7*  --   MCH 33.3 34.1*  --   MCHC 32.3 33.3  --   RDW 13.1 13.2  --   PLT 179 212  --     BNPNo results for input(s): BNP, PROBNP in the last 168 hours.   DDimer No results for input(s): DDIMER in the last 168 hours.   Radiology    ECHO TEE  Result Date: 07/29/2019    TRANSESOPHOGEAL ECHO REPORT   Patient Name:   Carlos Moreno Date of Exam: 07/29/2019 Medical Rec #:  [id number]       Height:       69.5 in Accession #:    [id number]      Weight:       245.6 lb  Date of Birth:  July 18, 1945       BSA:          2.266 m Patient Age:    53 years        BP:           124/77 mmHg Patient Gender: M               HR:           85 bpm. Exam Location:  Inpatient Procedure: Transesophageal Echo, Color Doppler and Cardiac Doppler Indications:     I48.92* Unspecified atrial flutter  History:         Patient has prior history of Echocardiogram examinations, most                  recent 07/29/2019. CAD, Arrythmias:Atrial Flutter; Risk                  Factors:Hypertension, Diabetes, Dyslipidemia and Sleep Apnea.  Sonographer:     Raquel Sarna Senior RDCS Referring Phys:  [place] G BARRETT Diagnosing Phys: Oswaldo Milian MD PROCEDURE: After discussion of the risks and benefits of a TEE, an informed consent was obtained from the patient. The transesophogeal probe was passed without difficulty through the esophogus of the patient. Sedation performed by different physician. The patient was monitored while under deep sedation. Anesthestetic sedation was provided intravenously by Anesthesiology: 332mg  of Propofol. The patient developed no complications during the procedure. A successful direct current cardioversion was performed at 200 joules with 1 attempt. IMPRESSIONS  1. Left ventricular ejection fraction, by estimation, is 55 to 60%. The left ventricle has normal function. The left ventricle has no regional wall motion abnormalities. There is moderate left ventricular  hypertrophy.  2. Right ventricular systolic function is normal. The right ventricular size is mildly enlarged.  3. Left atrial size was mildly dilated. No left atrial/left atrial appendage thrombus was detected.  4. The mitral valve is normal in structure. Mild mitral valve regurgitation.  5. Tricuspid valve regurgitation is mild to moderate.  6. The aortic valve is tricuspid. Aortic valve regurgitation is trivial.  7. Transgastric views were not attempted given history of bariatric surgery with gastric pouch  8. No LAA  appendage thrombus seen. Following TEE, patient underwent DCCV with 200J x1, with conversion to normal sinus rhythm FINDINGS  Left Ventricle: Left ventricular ejection fraction, by estimation, is 55 to 60%. The left ventricle has normal function. The left ventricle has no regional wall motion abnormalities. The left ventricular internal cavity size was normal in size. There is  moderate left ventricular hypertrophy. Right Ventricle: The right ventricular size is mildly enlarged. Right vetricular wall thickness was not assessed. Right ventricular systolic function is normal. Left Atrium: Left atrial size was mildly dilated. No left atrial/left atrial appendage thrombus was detected. Right Atrium: Right atrial size was normal in size. Pericardium: There is no evidence of pericardial effusion. Mitral Valve: The mitral valve is normal in structure. Mild mitral valve regurgitation. Tricuspid Valve: The tricuspid valve is normal in structure. Tricuspid valve regurgitation is mild to moderate. Aortic Valve: The aortic valve is tricuspid. Aortic valve regurgitation is trivial. Pulmonic Valve: The pulmonic valve was grossly normal. Pulmonic valve regurgitation is trivial. Aorta: The aortic root is normal in size and structure. IAS/Shunts: No atrial level shunt detected by color flow Doppler. Oswaldo Milian MD Electronically signed by Oswaldo Milian MD Signature Date/Time: 07/29/2019/1:12:06 PM    Final     Cardiac Studies   Echo pending read TEE today: 1. Left ventricular ejection fraction, by estimation, is 55 to 60%. The  left ventricle has normal function. The left ventricle has no regional  wall motion abnormalities. There is moderate left ventricular hypertrophy.  2. Right ventricular systolic function is normal. The right ventricular  size is mildly enlarged.  3. Left atrial size was mildly dilated. No left atrial/left atrial  appendage thrombus was detected.  4. The mitral valve is normal  in structure. Mild mitral valve  regurgitation.  5. Tricuspid valve regurgitation is mild to moderate.  6. The aortic valve is tricuspid. Aortic valve regurgitation is trivial.  7. Transgastric views were not attempted given history of bariatric  surgery with gastric pouch  8. No LAA appendage thrombus seen. Following TEE, patient underwent DCCV  with 200J x1, with conversion to normal sinus rhythm   Patient Profile     74 y.o. 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  Assessment & Plan    Typical atrial flutter -rate controlled, history of bradycardia makes further titration difficult -CHA2DS2/VAS Stroke Risk Points=4 -s/p TEE-CV and now in sinus bradycardia -continue anticoagulation for at least 4 weeks, ideally long term, but monitor for bleeding   CAD: no symptoms -continue medical management with atorvastatin, beta blocker, imdur -last stent 2017, given prior GI bleed stopped aspirin since starting DOAC  Type II diabetes, diet controlled -agree with recommendations re: SGLT2i given CAD and chronic kidney disease, though on hold with recent increase in Cr. -tradjenta continued -SSI and glucose checks -blood sugars  have been within acceptable ranges  OSA on CPAP: -using home CPAP  Will plan for discharge later today.  For questions or updates, please contact [facility] Please consult [email or web address] for contact info under     Signed, Buford Dresser, MD  07/29/2019, 4:36 PM

## 2019-07-29 NOTE — Anesthesia Preprocedure Evaluation (Signed)
Anesthesia Evaluation  Patient identified by MRN, date of birth, ID band Patient awake    Reviewed: Allergy & Precautions, NPO status , Patient's Chart, lab work & pertinent test results  Airway Mallampati: II  TM Distance: >3 FB     Dental   Pulmonary former smoker,    breath sounds clear to auscultation       Cardiovascular hypertension, + CAD   Rhythm:Regular Rate:Normal     Neuro/Psych    GI/Hepatic negative GI ROS, Neg liver ROS,   Endo/Other  diabetes  Renal/GU Renal disease     Musculoskeletal   Abdominal   Peds  Hematology   Anesthesia Other Findings   Reproductive/Obstetrics                             Anesthesia Physical Anesthesia Plan  ASA: III  Anesthesia Plan: General   Post-op Pain Management:    Induction: Intravenous  PONV Risk Score and Plan: 2 and Propofol infusion  Airway Management Planned: Nasal Cannula and Simple Face Mask  Additional Equipment:   Intra-op Plan:   Post-operative Plan:   Informed Consent: I have reviewed the patients History and Physical, chart, labs and discussed the procedure including the risks, benefits and alternatives for the proposed anesthesia with the patient or authorized representative who has indicated his/her understanding and acceptance.     Dental advisory given  Plan Discussed with: CRNA and Anesthesiologist  Anesthesia Plan Comments:         Anesthesia Quick Evaluation

## 2019-07-29 NOTE — Progress Notes (Signed)
Echocardiogram 2D Echocardiogram has been performed.  Oneal Deputy Kaydie Petsch 07/29/2019, 11:49 AM

## 2019-07-29 NOTE — Anesthesia Postprocedure Evaluation (Signed)
Anesthesia Post Note  Patient: Carlos Moreno  Procedure(s) Performed: TRANSESOPHAGEAL ECHOCARDIOGRAM (TEE) (N/A ) CARDIOVERSION (N/A )     Patient location during evaluation: Endoscopy Anesthesia Type: General Level of consciousness: awake Pain management: pain level controlled Vital Signs Assessment: post-procedure vital signs reviewed and stable Respiratory status: spontaneous breathing Cardiovascular status: stable Postop Assessment: no apparent nausea or vomiting Anesthetic complications: no   No complications documented.  Last Vitals:  Vitals:   07/29/19 1300 07/29/19 1316  BP: (!) 103/53 112/67  Pulse: (!) 57 (!) 51  Resp: 19 20  Temp:  36.5 C  SpO2: 97% 100%    Last Pain:  Vitals:   07/29/19 1316  TempSrc: Oral  PainSc:                  Shanzay Hepworth

## 2019-07-29 NOTE — Interval H&P Note (Signed)
History and Physical Interval Note:  07/29/2019 10:50 AM  Carlos Moreno  has presented today for surgery, with the diagnosis of A flutter.  The various methods of treatment have been discussed with the patient and family. After consideration of risks, benefits and other options for treatment, the patient has consented to  Procedure(s): TRANSESOPHAGEAL ECHOCARDIOGRAM (TEE) (N/A) CARDIOVERSION (N/A) as a surgical intervention.  The patient's history has been reviewed, patient examined, no change in status, stable for surgery.  I have reviewed the patient's chart and labs.  Questions were answered to the patient's satisfaction.     Donato Heinz

## 2019-07-29 NOTE — TOC Transition Note (Signed)
Transition of Care [facility]) - CM/SW Discharge Note   Patient Details  Name: Carlos Moreno MRN: [id number] Date of Birth: 04/01/1945  [facility] CM/SW Contact:  Verdell Carmine, RN Phone Number: 07/29/2019, 5:11 PM   Clinical Narrative:     Patient aware of co-pay for eliquis. Gave patient eliquis 30 day free card. No further needs identified. Spoke to patient about good Rx. Patient is already utilizing this.   Final next level of care: Home/Self Care Barriers to Discharge: No Barriers Identified   Patient Goals and CMS Choice        Discharge Placement        Home self care               Discharge Plan and Services                                     Social Determinants of Health (SDOH) Interventions     Readmission Risk Interventions No flowsheet data found.

## 2019-07-29 NOTE — CV Procedure (Signed)
   TRANSESOPHAGEAL ECHOCARDIOGRAM GUIDED DIRECT CURRENT CARDIOVERSION  NAME:  Carlos Moreno   MRN: [id number] DOB:  1945/12/27   ADMIT DATE: 07/27/2019  INDICATIONS: Symptomatic atrial flutter  PROCEDURE:   Informed consent was obtained prior to the procedure. The risks, benefits and alternatives for the procedure were discussed and the patient comprehended these risks.  Risks include, but are not limited to, cough, sore throat, vomiting, nausea, somnolence, esophageal and stomach trauma or perforation, bleeding, low blood pressure, aspiration, pneumonia, infection, trauma to the teeth and death.    After a procedural time-out, the oropharynx was anesthetized and the patient was sedated by the anesthesia service. The transesophageal probe was inserted in the esophagus and stomach without difficulty and multiple views were obtained. Anesthesia was monitored by Dr. Nyoka Cowden.   COMPLICATIONS:    Complications: No complications Patient tolerated procedure well.  FINDINGS:  No LAA thrombus  CARDIOVERSION:     Indications:  Symptomatic Atrial Flutter  Procedure Details:  Once the TEE was complete, the patient had the defibrillator pads placed in the anterior and posterior position. Once an appropriate level of sedation was confirmed, the patient was cardioverted x 1 with 200J of biphasic synchronized energy.  The patient converted to NSR.  There were no apparent complications.  The patient had normal neuro status and respiratory status post procedure with vitals stable as recorded elsewhere.  Adequate airway was maintained throughout and vital signs monitored per protocol.  Oswaldo Milian MD [place] [facility]  [address], [place], [place] [address] [phone]   12:38 PM

## 2019-07-29 NOTE — Transfer of Care (Signed)
Immediate Anesthesia Transfer of Care Note  Patient: Carlos Moreno  Procedure(s) Performed: TRANSESOPHAGEAL ECHOCARDIOGRAM (TEE) (N/A ) CARDIOVERSION (N/A )  Patient Location: PACU and Endoscopy Unit  Anesthesia Type:General  Level of Consciousness: awake and drowsy  Airway & Oxygen Therapy: Patient Spontanous Breathing  Post-op Assessment: Report given to RN and Post -op Vital signs reviewed and stable  Post vital signs: Reviewed and stable  Last Vitals:  Vitals Value Taken Time  BP 91/34 07/29/19 1237  Temp    Pulse 58 07/29/19 1238  Resp 22 07/29/19 1238  SpO2 96 % 07/29/19 1238  Vitals shown include unvalidated device data.  Last Pain:  Vitals:   07/29/19 1031  TempSrc: Oral  PainSc: 5       Patients Stated Pain Goal: 2 (26/94/85 4627)  Complications: No complications documented.

## 2019-07-29 NOTE — Progress Notes (Signed)
Echocardiogram Echocardiogram Transesophageal has been performed.  Oneal Deputy Leaha Cuervo 07/29/2019, 12:38 PM

## 2019-07-29 NOTE — Anesthesia Procedure Notes (Signed)
Procedure Name: MAC Date/Time: 07/29/2019 11:55 AM Performed by: Inda Coke, CRNA Pre-anesthesia Checklist: Patient identified, Emergency Drugs available, Suction available, Timeout performed and Patient being monitored Patient Re-evaluated:Patient Re-evaluated prior to induction Oxygen Delivery Method: Nasal cannula Induction Type: IV induction Dental Injury: Teeth and Oropharynx as per pre-operative assessment

## 2019-07-29 NOTE — Discharge Summary (Signed)
Discharge Summary    Patient ID: Carlos Moreno MRN: [id number]; DOB: 1945/09/02  Admit date: 07/27/2019 Discharge date: 07/29/2019  Primary Care Provider: Crist Infante, MD  Primary Cardiologist: Minus Breeding, MD  Primary Electrophysiologist:  None   Discharge Diagnoses    Principal Problem:   Atrial flutter [facility]) Active Problems:   Coronary artery disease involving native heart without angina pectoris   Essential hypertension   Diabetes mellitus type 2, diet-controlled ([place])   CKD stage 3 due to type 2 diabetes mellitus ([place])    Diagnostic Studies/Procedures    Echocardiogram, see below TEE-cardioversion, see below _____________   History of Present Illness     [name] is a 74 y.o. male with PMH CAD with prior PCI, type II diabetes, hypertension, hyperlipidemia, OSA on CPAP, prior gastric bypass surgery who presented with tachycardia and palpitations on 07/27/19.  Hospital Course     Consultants: none  Patient found to be in atrial flutter on presentation. He was rate controlled, but due to history of bradycardia, options were limited for additional management. Given new onset and chadsvasc score of 4, he was started on oral anticoagulation. He underwent successful TEE-cardioversion on 07/29/19 and was discharged to home afterward.   Typical atrial flutter -was rate controlled, history of bradycardia makes further titration difficult. Continued to have symptoms while in atrial flutter as well. -CHA2DS2/VAS Stroke Risk Points=4 -s/p TEE-CV and now in sinus bradycardia -continue anticoagulation for at least 4 weeks, ideally long term, but monitor for bleeding. Has history of severe remote GI bleeding, will monitor for melena/hematochezia.   CAD: no symptoms -continue medical management with atorvastatin, beta blocker, imdur -last stent 2017, given prior GI bleed stopped aspirin since starting DOAC  Type II diabetes, diet controlled -agree with  recommendations re: SGLT2i given CAD and chronic kidney disease, though on hold with recent increase in Cr. -tradjenta continued -follow up with PCP for further titration  OSA on CPAP: -using home CPAP  Did the patient have an acute coronary syndrome (MI, NSTEMI, STEMI, etc) this admission?:  No                               Did the patient have a percutaneous coronary intervention (stent / angioplasty)?:  No.   _____________  Discharge Vitals Blood pressure 112/67, pulse (!) 51, temperature 97.7 F (36.5 C), temperature source Oral, resp. rate 20, height 5' 9.5" (1.765 m), weight 111.4 kg, SpO2 100 %.  Filed Weights   07/28/19 0512 07/29/19 0449 07/29/19 1031  Weight: 111.7 kg 111.4 kg 111.4 kg    Labs & Radiologic Studies    CBC Recent Labs    07/27/19 1102 07/27/19 1102 07/29/19 0850 07/29/19 1130  WBC 12.3*  --  7.6  --   NEUTROABS 10.1*  --   --   --   HGB 13.2   < > 14.1 14.3  HCT 40.9   < > 42.4 42.0  MCV 103.3*  --  102.7*  --   PLT 179  --  212  --    < > = values in this interval not displayed.   Basic Metabolic Panel Recent Labs    07/27/19 1102 07/27/19 1102 07/28/19 0424 07/28/19 0424 07/28/19 1550 07/29/19 0850 07/29/19 1130  NA 138   < > 140   < >  --  140 140  K 4.5   < > 5.3*   < >  --  5.9* 4.8  CL 112*   < > 110   < >  --  109 110  CO2 17*   < > 20*  --   --  22  --   GLUCOSE 194*   < > 154*   < >  --  149* 157*  BUN 61*   < > 51*   < >  --  42* 53*  CREATININE 2.18*   < > 1.84*   < >  --  1.76* 1.70*  CALCIUM 8.9   < > 9.2  --   --  9.7  --   MG 2.5*  --   --   --  2.3  --   --    < > = values in this interval not displayed.   Liver Function Tests Recent Labs    07/28/19 0424  AST 16  ALT 39  ALKPHOS 64  BILITOT 1.8*  PROT 6.2*  ALBUMIN 3.4*   No results for input(s): LIPASE, AMYLASE in the last 72 hours. High Sensitivity Troponin:   Recent Labs  Lab 07/27/19 1102 07/27/19 1247 07/27/19 1648 07/27/19 1855  TROPONINIHS  18* 18* 16 17    BNP Invalid input(s): POCBNP D-Dimer No results for input(s): DDIMER in the last 72 hours. Hemoglobin A1C No results for input(s): HGBA1C in the last 72 hours. Fasting Lipid Panel No results for input(s): CHOL, HDL, LDLCALC, TRIG, CHOLHDL, LDLDIRECT in the last 72 hours. Thyroid Function Tests Recent Labs    07/27/19 1648  TSH 0.826   _____________  DG Chest 2 View  Result Date: 07/27/2019 CLINICAL DATA:  New onset a flutter EXAM: CHEST - 2 VIEW COMPARISON:  Thoracic spine x-rays from July of 2020 FINDINGS: Cardiomediastinal contours are normal. The signs of aortic atherosclerosis in the aortic arch. Hilar structures are normal. Lungs are clear. No sign of pleural effusion. On limited assessment skeletal structures are unremarkable aside from degenerative changes in the shoulders and spine. IMPRESSION: No acute cardiopulmonary disease. Electronically Signed   By: Zetta Bills M.D.   On: 07/27/2019 11:01   ECHOCARDIOGRAM COMPLETE  Result Date: 07/29/2019    ECHOCARDIOGRAM REPORT   Patient Name:   Carlos Moreno Date of Exam: 07/29/2019 Medical Rec #:  [id number]       Height:       69.5 in Accession #:    [id number]      Weight:       245.6 lb Date of Birth:  1945-05-09       BSA:          2.266 m Patient Age:    37 years        BP:           116/76 mmHg Patient Gender: M               HR:           83 bpm. Exam Location:  Inpatient Procedure: 2D Echo, Color Doppler and Cardiac Doppler Indications:    I48.92* Unspecified atrial flutter  History:        Patient has no prior history of Echocardiogram examinations.                 CAD, Arrythmias:Atrial Flutter; Risk Factors:Hypertension,                 Diabetes, Dyslipidemia and Sleep Apnea.  Sonographer:    Raquel Sarna Senior RDCS Referring Phys: [place]  1. Technically  difficult study due to poor sound wave transmission.  2. Left ventricular ejection fraction, by estimation, is 55 to 60%. The left  ventricle has normal function. The left ventricle has no regional wall motion abnormalities. There is mild left ventricular hypertrophy. Left ventricular diastolic parameters were normal.  3. Right ventricular systolic function is normal. The right ventricular size is normal. There is normal pulmonary artery systolic pressure.  4. Left atrial size was moderately dilated.  5. The mitral valve is normal in structure. Trivial mitral valve regurgitation.  6. The aortic valve is normal in structure. Aortic valve regurgitation is trivial.  7. Aortic dilatation noted. There is mild dilatation of the aortic root and of the ascending aorta. FINDINGS  Left Ventricle: Left ventricular ejection fraction, by estimation, is 55 to 60%. The left ventricle has normal function. The left ventricle has no regional wall motion abnormalities. The left ventricular internal cavity size was normal in size. There is  mild left ventricular hypertrophy. Left ventricular diastolic function could not be evaluated due to atrial fibrillation. Left ventricular diastolic parameters were normal. Right Ventricle: The right ventricular size is normal. No increase in right ventricular wall thickness. Right ventricular systolic function is normal. There is normal pulmonary artery systolic pressure. The tricuspid regurgitant velocity is 2.10 m/s, and  with an assumed right atrial pressure of 3 mmHg, the estimated right ventricular systolic pressure is 69.4 mmHg. Left Atrium: Left atrial size was moderately dilated. Right Atrium: Right atrial size was normal in size. Pericardium: There is no evidence of pericardial effusion. Mitral Valve: The mitral valve is normal in structure. Trivial mitral valve regurgitation. Tricuspid Valve: The tricuspid valve is normal in structure. Tricuspid valve regurgitation is mild. Aortic Valve: The aortic valve is normal in structure. Aortic valve regurgitation is trivial. Pulmonic Valve: The pulmonic valve was normal in  structure. Pulmonic valve regurgitation is trivial. Aorta: Aortic dilatation noted and the aortic root was not well visualized. There is mild dilatation of the aortic root and of the ascending aorta. IAS/Shunts: No atrial level shunt detected by color flow Doppler.  LEFT VENTRICLE PLAX 2D LVIDd:         3.90 cm LVIDs:         3.20 cm LV PW:         1.30 cm LV IVS:        1.40 cm LVOT diam:     2.10 cm LV SV:         49 LV SV Index:   22 LVOT Area:     3.46 cm  RIGHT VENTRICLE RV S prime:     9.36 cm/s TAPSE (M-mode): 1.5 cm LEFT ATRIUM             Index LA diam:        3.30 cm 1.46 cm/m LA Vol (A2C):   71.3 ml 31.47 ml/m LA Vol (A4C):   66.7 ml 29.44 ml/m LA Biplane Vol: 71.3 ml 31.47 ml/m  AORTIC VALVE LVOT Vmax:   76.13 cm/s LVOT Vmean:  56.600 cm/s LVOT VTI:    0.143 m  AORTA Ao Root diam: 3.60 cm Ao Asc diam:  3.90 cm TRICUSPID VALVE TR Peak grad:   17.6 mmHg TR Vmax:        210.00 cm/s  SHUNTS Systemic VTI:  0.14 m Systemic Diam: 2.10 cm Glori Bickers MD Electronically signed by Glori Bickers MD Signature Date/Time: 07/29/2019/5:23:28 PM    Final    ECHO TEE  Result Date: 07/29/2019  TRANSESOPHOGEAL ECHO REPORT   Patient Name:   DEMPSEY AHONEN Date of Exam: 07/29/2019 Medical Rec #:  [id number]       Height:       69.5 in Accession #:    [id number]      Weight:       245.6 lb Date of Birth:  1945-08-04       BSA:          2.266 m Patient Age:    34 years        BP:           124/77 mmHg Patient Gender: M               HR:           85 bpm. Exam Location:  Inpatient Procedure: Transesophageal Echo, Color Doppler and Cardiac Doppler Indications:     I48.92* Unspecified atrial flutter  History:         Patient has prior history of Echocardiogram examinations, most                  recent 07/29/2019. CAD, Arrythmias:Atrial Flutter; Risk                  Factors:Hypertension, Diabetes, Dyslipidemia and Sleep Apnea.  Sonographer:     Raquel Sarna Senior RDCS Referring Phys:  [place] [facility] BARRETT Diagnosing  Phys: Oswaldo Milian MD PROCEDURE: After discussion of the risks and benefits of a TEE, an informed consent was obtained from the patient. The transesophogeal probe was passed without difficulty through the esophogus of the patient. Sedation performed by different physician. The patient was monitored while under deep sedation. Anesthestetic sedation was provided intravenously by Anesthesiology: 332mg  of Propofol. The patient developed no complications during the procedure. A successful direct current cardioversion was performed at 200 joules with 1 attempt. IMPRESSIONS  1. Left ventricular ejection fraction, by estimation, is 55 to 60%. The left ventricle has normal function. The left ventricle has no regional wall motion abnormalities. There is moderate left ventricular hypertrophy.  2. Right ventricular systolic function is normal. The right ventricular size is mildly enlarged.  3. Left atrial size was mildly dilated. No left atrial/left atrial appendage thrombus was detected.  4. The mitral valve is normal in structure. Mild mitral valve regurgitation.  5. Tricuspid valve regurgitation is mild to moderate.  6. The aortic valve is tricuspid. Aortic valve regurgitation is trivial.  7. Transgastric views were not attempted given history of bariatric surgery with gastric pouch  8. No LAA appendage thrombus seen. Following TEE, patient underwent DCCV with 200J x1, with conversion to normal sinus rhythm FINDINGS  Left Ventricle: Left ventricular ejection fraction, by estimation, is 55 to 60%. The left ventricle has normal function. The left ventricle has no regional wall motion abnormalities. The left ventricular internal cavity size was normal in size. There is  moderate left ventricular hypertrophy. Right Ventricle: The right ventricular size is mildly enlarged. Right vetricular wall thickness was not assessed. Right ventricular systolic function is normal. Left Atrium: Left atrial size was mildly dilated. No  left atrial/left atrial appendage thrombus was detected. Right Atrium: Right atrial size was normal in size. Pericardium: There is no evidence of pericardial effusion. Mitral Valve: The mitral valve is normal in structure. Mild mitral valve regurgitation. Tricuspid Valve: The tricuspid valve is normal in structure. Tricuspid valve regurgitation is mild to moderate. Aortic Valve: The aortic valve is tricuspid. Aortic valve regurgitation is trivial. Pulmonic Valve: The pulmonic  valve was grossly normal. Pulmonic valve regurgitation is trivial. Aorta: The aortic root is normal in size and structure. IAS/Shunts: No atrial level shunt detected by color flow Doppler. Oswaldo Milian MD Electronically signed by Oswaldo Milian MD Signature Date/Time: 07/29/2019/1:12:06 PM    Final    Disposition   Pt is being discharged home today in good condition.  Follow-up Plans & Appointments     Follow-up Information    Minus Breeding, MD Follow up.   Specialty: Cardiology Why: Our office will contact you directly to arrange a post-hospital follow-up appointment. Contact information: [address] [place] [address] [phone]              Discharge Instructions    Diet - low sodium heart healthy   Complete by: As directed    Increase activity slowly   Complete by: As directed       Discharge Medications   Allergies as of 07/29/2019   No Known Allergies     Medication List    STOP taking these medications   aspirin EC 81 MG tablet   losartan 100 MG tablet Commonly known as: COZAAR     TAKE these medications   apixaban 5 MG Tabs tablet Commonly known as: ELIQUIS Take 1 tablet (5 mg total) by mouth 2 (two) times daily.   atorvastatin 40 MG tablet Commonly known as: LIPITOR Take 40 mg by mouth daily.   b complex vitamins capsule Take 1 capsule by mouth every other day.   B-12 5000 MCG Subl Place 1 tablet under the tongue every other day.   Bystolic  5 MG tablet Generic drug: nebivolol Take 2 tablets (10 mg total) by mouth daily. What changed: how much to take   CALCIUM GUMMIES PO Take 2 tablets by mouth daily. Takes two daily   cetirizine 10 MG tablet Commonly known as: ZYRTEC Take 10 mg by mouth daily.   famotidine 20 MG tablet Commonly known as: Pepcid Take 1 tablet (20 mg total) by mouth 2 (two) times daily.   IRON UP PO Take 120 mg by mouth daily.   isosorbide mononitrate 30 MG 24 hr tablet Commonly known as: IMDUR Take 30 mg by mouth daily.   Jardiance 25 MG Tabs tablet Generic drug: empagliflozin Take 1 tablet (25 mg total) by mouth daily. Do not restart until you follow up with your PCP What changed: additional instructions   Melatonin ER 5 MG Tbcr Take 5 mg by mouth every evening.   nitroGLYCERIN 0.4 MG SL tablet Commonly known as: NITROSTAT Place 1 tablet (0.4 mg total) under the tongue every 5 (five) minutes as needed for chest pain.   sucralfate 1 g tablet Commonly known as: Carafate Take 1 tablet (1 g total) by mouth 2 (two) times daily as needed.   tiZANidine 4 MG tablet Commonly known as: ZANAFLEX Take 4 mg by mouth at bedtime.   Tradjenta 5 MG Tabs tablet Generic drug: linagliptin Take 5 mg by mouth daily.   traMADol 50 MG tablet Commonly known as: ULTRAM Take 50 mg by mouth every 6 (six) hours as needed for moderate pain.   Vision Formula/Lutein Tabs Take 1 tablet by mouth daily.   vitamin C 500 MG tablet Commonly known as: ASCORBIC ACID Take 500 mg by mouth daily.   zinc gluconate 50 MG tablet Take 50 mg by mouth daily.          Outstanding Labs/Studies   None  Duration of Discharge Encounter  Greater than 30 minutes including physician time.  Signed, Buford Dresser, MD 07/29/2019, 5:41 PM

## 2019-08-12 NOTE — Progress Notes (Signed)
Cardiology Office Note   Date:  08/13/2019   ID:  Carlos Moreno, DOB 1945/08/05, MRN [id number]  PCP:  Crist Infante, MD  Cardiologist:   Minus Breeding, MD  Chief Complaint  Patient presents with  . Atrial Flutter      History of Present Illness: Carlos Moreno is a 74 y.o. male who presents for follow up after recent hospitalization for atrial flutter.  He was anticoagulated and had TEE cardioversion.  He did have some slow rates.  He is taken off of aspirin because of previous history of GI bleed.  His Apple Watch actually had alerted him to this rhythm.  He has not had any further alerts.  His heart rate at home is in the 40s and 50s.  He has been a little fatigued.  He did not ever really feel tachypalpitations.  He is not had any presyncope or syncope.  He has had no chest pressure, neck or arm discomfort.  He has had no new shortness of breath, PND or orthopnea.  He has had no weight gain or edema.  He had distant stenting in 2005. He also had more recent stenting in 2007.  His prior cardiac treatment was in [place].  Of note his last catheterization was 2017.   Past Medical History:  Diagnosis Date  . Anemia   . Atrial flutter with rapid ventricular response ([place]) 07/27/2019  . Basal cell carcinoma   . CAD (coronary artery disease) 2005    PCI to mid LAD with a 3.0 x 15 mm Resolute Integrity DES May 2017. LAD Stent 2005 (Details not available)  . Cataract   . CKD stage 3 due to type 2 diabetes mellitus ([place]) 07/27/2019  . Curvature of spine   . Diabetes mellitus type 2, diet-controlled ([place]) 07/27/2019  . Gallstones   . Hypertension   . Kidney stones   . Macular degeneration   . Obesity   . OSA (obstructive sleep apnea)    CPAP    Past Surgical History:  Procedure Laterality Date  . ABDOMINOPLASTY  02/2009  . CARDIOVERSION N/A 07/29/2019   Procedure: CARDIOVERSION;  Surgeon: Donato Heinz, MD;  Location: [place];  Service: Cardiovascular;   Laterality: N/A;  . CATARACT EXTRACTION, BILATERAL  03/2007  . CHOLECYSTECTOMY  2001  . COLONOSCOPY  08/07 11/12 04/16   . CORONARY ANGIOPLASTY WITH STENT PLACEMENT  07/2003   Stent LAD, 06/2015 DES LAD  . HIP SURGERY Left   . INGUINAL HERNIA REPAIR Left 1956  . KNEE ARTHROSCOPY Right 05/2005   x3  . LAMINECTOMY  2009   Lumbar  . REPLACEMENT TOTAL KNEE Left 1994  . REPLACEMENT TOTAL KNEE Right    x 3  . 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 TEE WITHOUT CARDIOVERSION N/A 07/29/2019   Procedure: TRANSESOPHAGEAL ECHOCARDIOGRAM (TEE);  Surgeon: Donato Heinz, MD;  Location: [facility];  Service: Cardiovascular;  Laterality: N/A;  . TOTAL HIP ARTHROPLASTY Right 01/2007     Current Outpatient Medications  Medication Sig Dispense Refill  . apixaban (ELIQUIS) 5 MG TABS tablet Take 1 tablet (5 mg total) by mouth 2 (two) times daily. 60 tablet 11  . atorvastatin (LIPITOR) 40 MG tablet Take 40 mg by mouth daily.  3  . b complex vitamins capsule Take 1 capsule by mouth every other day.     . Calcium-Phosphorus-Vitamin D (CALCIUM GUMMIES PO) Take 2 tablets by mouth daily. Takes two daily     . cetirizine (ZYRTEC) 10 MG  tablet Take 10 mg by mouth daily.    . Cyanocobalamin (B-12) 5000 MCG SUBL Place 1 tablet under the tongue every other day.     . famotidine (PEPCID) 20 MG tablet Take 1 tablet (20 mg total) by mouth 2 (two) times daily. 60 tablet 11  . isosorbide mononitrate (IMDUR) 30 MG 24 hr tablet Take 30 mg by mouth daily.  3  . JARDIANCE 25 MG TABS tablet Take 1 tablet (25 mg total) by mouth daily. Do not restart until you follow up with your PCP 30 tablet   . Melatonin ER 5 MG TBCR Take 5 mg by mouth every evening.     . Multiple Vitamins-Minerals (VISION FORMULA/LUTEIN) TABS Take 1 tablet by mouth daily.     . nebivolol (BYSTOLIC) 5 MG tablet Take 5 mg by mouth daily.    . nitroGLYCERIN (NITROSTAT) 0.4 MG SL tablet Place 1 tablet (0.4 mg total) under the tongue every 5  (five) minutes as needed for chest pain. 25 tablet 3  . Polysaccharide Iron Complex (IRON UP PO) Take 120 mg by mouth daily.    . sucralfate (CARAFATE) 1 g tablet Take 1 tablet (1 g total) by mouth 2 (two) times daily as needed. 60 tablet 11  . tiZANidine (ZANAFLEX) 4 MG tablet Take 4 mg by mouth at bedtime.    . TRADJENTA 5 MG TABS tablet Take 5 mg by mouth daily.     . traMADol (ULTRAM) 50 MG tablet Take 50 mg by mouth every 6 (six) hours as needed for moderate pain.     . vitamin C (ASCORBIC ACID) 500 MG tablet Take 500 mg by mouth daily.    Marland Kitchen zinc gluconate 50 MG tablet Take 50 mg by mouth daily.      No current facility-administered medications for this visit.    Allergies:   Patient has no known allergies.    ROS:  Please see the history of present illness.   Otherwise, review of systems are positive for none.   All other systems are reviewed and negative.    PHYSICAL EXAM: VS:  BP 130/64   Pulse (!) 54   Wt 250 lb 3.2 oz (113.5 kg)   BMI 36.42 kg/m  , BMI Body mass index is 36.42 kg/m. GENERAL:  Well appearing NECK:  No jugular venous distention, waveform within normal limits, carotid upstroke brisk and symmetric, no bruits, no thyromegaly LUNGS:  Clear to auscultation bilaterally CHEST:  Unremarkable HEART:  PMI not displaced or sustained,S1 and S2 within normal limits, no S3, no S4, no clicks, no rubs, no murmurs ABD:  Flat, positive bowel sounds normal in frequency in pitch, no bruits, no rebound, no guarding, no midline pulsatile mass, no hepatomegaly, no splenomegaly EXT:  2 plus pulses throughout, mild edema, no cyanosis no clubbing    EKG:  EKG is not ordered today.    Recent Labs: 07/27/2019: TSH 0.826 07/28/2019: ALT 39; Magnesium 2.3 07/29/2019: BUN 53; Creatinine, Ser 1.70; Hemoglobin 14.3; Platelets 212; Potassium 4.8; Sodium 140    Lipid Panel No results found for: CHOL, TRIG, HDL, CHOLHDL, VLDL, LDLCALC, LDLDIRECT    Wt Readings from Last 3  Encounters:  08/13/19 250 lb 3.2 oz (113.5 kg)  07/29/19 245 lb 9.5 oz (111.4 kg)  06/22/19 259 lb 6 oz (117.7 kg)      Other studies Reviewed: Additional studies/ records that were reviewed today include: Hospital records. Review of the above records demonstrates:  Please see elsewhere in the  note.     ASSESSMENT AND PLAN:  ATRIAL FLUTTER:  He is on anticoagulation and has been cardioverted.   He seems to be maintaining sinus rhythm as he has had no alerts on his watch.  He is going to continue his anticoagulation but we talked about what might need to happen if he has recurrent GI bleeding.  He would it looks like to be a candidate for flutter ablation.  I am going to reduce his beta-blocker because he has bradycardic sinus rhythms.  I will check a CBC in about 1 month given his previous history of GI bleeding.  CAD: He had no symptoms.  His at bedtime troponin was essentially negative during his hospitalization.  No further work-up.    HTN: His BP is he is currently controlled.  He did have his ARB discontinued in the hospital and I might restart this if his blood pressure starts creeping up.  SLEEP APNEA: He wears CPAP.  DYSLIPIDEMIA:LDL was 28 most recently with an HDL of 61.  No change in therapy.   HYPERKALEMIA: He had a mildly elevated potassium in the hospital and we can repeat a basic metabolic profile in a month as well.  COVID EDUCATION: He has been vaccinated.     Current medicines are reviewed at length with the patient today.  The patient does not have concerns regarding medicines.  The following changes have been made: As above  Labs/ tests ordered today include:   Orders Placed This Encounter  Procedures  . CBC with Differential/Platelet  . Basic metabolic panel     Disposition:   FU with me in 3 months.     Signed, Minus Breeding, MD  08/13/2019 8:59 AM    [place]

## 2019-08-13 ENCOUNTER — Other Ambulatory Visit: Payer: Self-pay

## 2019-08-13 ENCOUNTER — Ambulatory Visit: Payer: Medicare Other | Admitting: Cardiology

## 2019-08-13 ENCOUNTER — Encounter: Payer: Self-pay | Admitting: Cardiology

## 2019-08-13 VITALS — BP 130/64 | HR 54 | Wt 250.2 lb

## 2019-08-13 DIAGNOSIS — Z5181 Encounter for therapeutic drug level monitoring: Secondary | ICD-10-CM

## 2019-08-13 DIAGNOSIS — G4730 Sleep apnea, unspecified: Secondary | ICD-10-CM | POA: Diagnosis not present

## 2019-08-13 DIAGNOSIS — I10 Essential (primary) hypertension: Secondary | ICD-10-CM

## 2019-08-13 DIAGNOSIS — I2510 Atherosclerotic heart disease of native coronary artery without angina pectoris: Secondary | ICD-10-CM

## 2019-08-13 DIAGNOSIS — I483 Typical atrial flutter: Secondary | ICD-10-CM

## 2019-08-13 DIAGNOSIS — E785 Hyperlipidemia, unspecified: Secondary | ICD-10-CM

## 2019-08-13 NOTE — Patient Instructions (Addendum)
Medication Instructions:  DECREASE YOUR BYSTOLIC TO 5 MG DAILY   *If you need a refill on your cardiac medications before your next appointment, please call your pharmacy*   Lab Work: BMET/CBC IN 1 MONTH   Testing/Procedures: NONE    Follow-Up: At [facility], you and your health needs are our priority.  As part of our continuing mission to provide you with exceptional heart care, we have created designated Provider Care Teams.  These Care Teams include your primary Cardiologist (physician) and Advanced Practice Providers (APPs -  Physician Assistants and Nurse Practitioners) who all work together to provide you with the care you need, when you need it.  We recommend signing up for the patient portal called "MyChart".  Sign up information is provided on this After Visit Summary.  MyChart is used to connect with patients for Virtual Visits (Telemedicine).  Patients are able to view lab/test results, encounter notes, upcoming appointments, etc.  Non-urgent messages can be sent to your provider as well.   To learn more about what you can do with MyChart, go to [email or web address].    Your next appointment:   3 month(s)  The format for your next appointment:   In Person  Provider:   You may see Minus Breeding, MD or one of the following Advanced Practice Providers on your designated Care Team:    Rosaria Ferries, PA-C  Jory Sims, DNP, ANP  Cadence Kathlen Mody, NP

## 2019-10-28 ENCOUNTER — Other Ambulatory Visit: Payer: Self-pay | Admitting: Cardiology

## 2019-10-29 NOTE — Telephone Encounter (Signed)
Patient recently had office visit with Dr. Minus Breeding on 08/13/19, no changes were made to medication list and patient has a scheduled follow up in October. Medication was last prescribed and refilled by physician assistant Nelva Nay, okay to authorize refill? KW

## 2019-11-14 DIAGNOSIS — E875 Hyperkalemia: Secondary | ICD-10-CM | POA: Insufficient documentation

## 2019-11-14 NOTE — Progress Notes (Signed)
Cardiology Office Note   Date:  11/15/2019   ID:  Carlos Moreno, DOB 1945/06/19, MRN [id number]  PCP:  Crist Infante, MD  Cardiologist:   Minus Breeding, MD  Chief Complaint  Patient presents with  . Atrial Flutter      History of Present Illness: Carlos Moreno is a 74 y.o. male who presents for follow up after recent hospitalization for atrial flutter.  He was anticoagulated and had TEE cardioversion.  He did have some slow rates.  He is taken off of aspirin because of previous history of GI bleed.  He had distant stenting in 2005. He also had more recent stenting in 2007.  His prior cardiac treatment was in [place].  Of note his last catheterization was 2017.  Since I last saw him he has had no new problems.  He has some hemorrhoidal bleeding but otherwise tolerates anticoagulation.  He has some bruising on his abdomen and his skin but again tolerates it otherwise.  His apple watch would tell me that he had palpitations or arrhythmias and is not having these.  He does some exercising despite being limited by back pain.   Past Medical History:  Diagnosis Date  . Anemia   . Atrial flutter with rapid ventricular response ([place]) 07/27/2019  . Basal cell carcinoma   . CAD (coronary artery disease) 2005    PCI to mid LAD with a 3.0 x 15 mm Resolute Integrity DES May 2017. LAD Stent 2005 (Details not available)  . Cataract   . CKD stage 3 due to type 2 diabetes mellitus ([place]) 07/27/2019  . Curvature of spine   . Diabetes mellitus type 2, diet-controlled ([place]) 07/27/2019  . Gallstones   . Hypertension   . Kidney stones   . Macular degeneration   . Obesity   . OSA (obstructive sleep apnea)    CPAP    Past Surgical History:  Procedure Laterality Date  . ABDOMINOPLASTY  02/2009  . CARDIOVERSION N/A 07/29/2019   Procedure: CARDIOVERSION;  Surgeon: Donato Heinz, MD;  Location: [employer];  Service: Cardiovascular;  Laterality: N/A;  . CATARACT EXTRACTION,  BILATERAL  03/2007  . CHOLECYSTECTOMY  2001  . COLONOSCOPY  08/07 11/12 04/16   . CORONARY ANGIOPLASTY WITH STENT PLACEMENT  07/2003   Stent LAD, 06/2015 DES LAD  . HIP SURGERY Left   . INGUINAL HERNIA REPAIR Left 1956  . KNEE ARTHROSCOPY Right 05/2005   x3  . LAMINECTOMY  2009   Lumbar  . REPLACEMENT TOTAL KNEE Left 1994  . REPLACEMENT TOTAL KNEE Right    x 3  . 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 TEE WITHOUT CARDIOVERSION N/A 07/29/2019   Procedure: TRANSESOPHAGEAL ECHOCARDIOGRAM (TEE);  Surgeon: Donato Heinz, MD;  Location: [place];  Service: Cardiovascular;  Laterality: N/A;  . TOTAL HIP ARTHROPLASTY Right 01/2007     Current Outpatient Medications  Medication Sig Dispense Refill  . apixaban (ELIQUIS) 5 MG TABS tablet Take 1 tablet (5 mg total) by mouth 2 (two) times daily. 60 tablet 11  . atorvastatin (LIPITOR) 40 MG tablet Take 40 mg by mouth daily.  3  . b complex vitamins capsule Take 1 capsule by mouth every other day.     . Calcium-Phosphorus-Vitamin D (CALCIUM GUMMIES PO) Take 2 tablets by mouth daily. Takes two daily     . cetirizine (ZYRTEC) 10 MG tablet Take 10 mg by mouth daily.    . Cyanocobalamin (B-12) 5000 MCG SUBL Place 1 tablet under  the tongue every other day.     . famotidine (PEPCID) 20 MG tablet Take 1 tablet (20 mg total) by mouth 2 (two) times daily. 60 tablet 11  . isosorbide mononitrate (IMDUR) 30 MG 24 hr tablet Take 30 mg by mouth daily.  3  . JARDIANCE 25 MG TABS tablet Take 1 tablet (25 mg total) by mouth daily. Do not restart until you follow up with your PCP 30 tablet   . losartan (COZAAR) 25 MG tablet Take 25 mg by mouth daily.    . Melatonin ER 5 MG TBCR Take 5 mg by mouth every evening.     . Multiple Vitamins-Minerals (VISION FORMULA/LUTEIN) TABS Take 1 tablet by mouth daily.     . nebivolol (BYSTOLIC) 10 MG tablet Take 10 mg by mouth daily.    . nitroGLYCERIN (NITROSTAT) 0.4 MG SL tablet Place 1 tablet (0.4 mg total) under  the tongue every 5 (five) minutes as needed for chest pain. 25 tablet 3  . Polysaccharide Iron Complex (IRON UP PO) Take 120 mg by mouth daily.    Marland Kitchen tiZANidine (ZANAFLEX) 4 MG tablet Take 4 mg by mouth at bedtime.    . TRADJENTA 5 MG TABS tablet Take 5 mg by mouth daily.     . traMADol (ULTRAM) 50 MG tablet Take 50 mg by mouth every 6 (six) hours as needed for moderate pain.     . vitamin C (ASCORBIC ACID) 500 MG tablet Take 500 mg by mouth daily.    Marland Kitchen zinc gluconate 50 MG tablet Take 50 mg by mouth daily.      No current facility-administered medications for this visit.    Allergies:   Patient has no known allergies.    ROS:  Please see the history of present illness.   Otherwise, review of systems are positive for none.   All other systems are reviewed and negative.    PHYSICAL EXAM: VS:  BP (!) 150/65   Pulse (!) 43   Ht 5' 9.5" (1.765 m)   Wt 253 lb 9.6 oz (115 kg)   SpO2 98%   BMI 36.91 kg/m  , BMI Body mass index is 36.91 kg/m. GENERAL:  Well appearing NECK:  No jugular venous distention, waveform within normal limits, carotid upstroke brisk and symmetric, no bruits, no thyromegaly LUNGS:  Clear to auscultation bilaterally CHEST:  Unremarkable HEART:  PMI not displaced or sustained,S1 and S2 within normal limits, no S3, no S4, no clicks, no rubs, no murmurs ABD:  Flat, positive bowel sounds normal in frequency in pitch, no bruits, no rebound, no guarding, no midline pulsatile mass, no hepatomegaly, no splenomegaly EXT:  2 plus pulses throughout, mild leg edema, no cyanosis no clubbing   EKG:  EKG is  ordered today. Sinus rhythm, sinus bradycardia axis within normal limits, intervals within normal limits, no acute ST-T wave changes.   Recent Labs: 07/27/2019: TSH 0.826 07/28/2019: ALT 39; Magnesium 2.3 07/29/2019: BUN 53; Creatinine, Ser 1.70; Hemoglobin 14.3; Platelets 212; Potassium 4.8; Sodium 140    Lipid Panel No results found for: CHOL, TRIG, HDL, CHOLHDL, VLDL,  LDLCALC, LDLDIRECT    Wt Readings from Last 3 Encounters:  11/15/19 253 lb 9.6 oz (115 kg)  08/13/19 250 lb 3.2 oz (113.5 kg)  07/29/19 245 lb 9.5 oz (111.4 kg)      Other studies Reviewed: Additional studies/ records that were reviewed today include: Labs. Review of the above records demonstrates:  Please see elsewhere in the note.  ASSESSMENT AND PLAN:  ATRIAL FLUTTER:   He tolerates anticoagulation.  At the last visit I reduced his beta-blocker because of bradycardia and he is not having trouble with this.  He is not had any problems with bleeding.  Therefore, I plan on continuing his anticoagulation.   CAD: He has no symptoms related to this.  No change in therapy.  HTN: His BP elevated but this is very unusual.  No change in therapy.   SLEEP APNEA:  He wears CPAP.  DYSLIPIDEMIA:LDL was 28.  No change in therapy.   COVID EDUCATION: He has been vaccinated.    He has Moderna and is not yet able to get the booster.   Current medicines are reviewed at length with the patient today.  The patient does not have concerns regarding medicines.  The following changes have been made: As above  Labs/ tests ordered today include:   Orders Placed This Encounter  Procedures  . EKG 12-Lead     Disposition:   FU with me in 12  months.     Signed, Minus Breeding, MD  11/15/2019 10:04 AM    [facility]

## 2019-11-15 ENCOUNTER — Ambulatory Visit (INDEPENDENT_AMBULATORY_CARE_PROVIDER_SITE_OTHER): Payer: Medicare Other | Admitting: Cardiology

## 2019-11-15 ENCOUNTER — Encounter: Payer: Self-pay | Admitting: Cardiology

## 2019-11-15 ENCOUNTER — Other Ambulatory Visit: Payer: Self-pay

## 2019-11-15 VITALS — BP 150/65 | HR 43 | Ht 69.5 in | Wt 253.6 lb

## 2019-11-15 DIAGNOSIS — I483 Typical atrial flutter: Secondary | ICD-10-CM

## 2019-11-15 DIAGNOSIS — I10 Essential (primary) hypertension: Secondary | ICD-10-CM

## 2019-11-15 DIAGNOSIS — G4733 Obstructive sleep apnea (adult) (pediatric): Secondary | ICD-10-CM

## 2019-11-15 DIAGNOSIS — I2510 Atherosclerotic heart disease of native coronary artery without angina pectoris: Secondary | ICD-10-CM

## 2019-11-15 DIAGNOSIS — E875 Hyperkalemia: Secondary | ICD-10-CM

## 2019-11-15 DIAGNOSIS — E785 Hyperlipidemia, unspecified: Secondary | ICD-10-CM

## 2019-11-15 NOTE — Patient Instructions (Signed)
Medication Instructions:   No changes  *If you need a refill on your cardiac medications before your next appointment, please call your pharmacy*   Lab Work: Not needed    Testing/Procedures:  Not needed  Follow-Up: At [facility], you and your health needs are our priority.  As part of our continuing mission to provide you with exceptional heart care, we have created designated Provider Care Teams.  These Care Teams include your primary Cardiologist (physician) and Advanced Practice Providers (APPs -  Physician Assistants and Nurse Practitioners) who all work together to provide you with the care you need, when you need it.     Your next appointment:   12 month(s)  The format for your next appointment:   In Person  Provider:   James Hochrein, MD   

## 2020-01-25 ENCOUNTER — Telehealth: Payer: Self-pay | Admitting: Unknown Physician Specialty

## 2020-01-25 NOTE — Telephone Encounter (Signed)
I connected by phone with Carlos Moreno on 01/25/2020 at 12:14 PM to discuss the potential use of a new treatment for mild to moderate COVID-19 viral infection in non-hospitalized patients.  This patient is a 74 y.o. male that meets the FDA criteria for Emergency Use Authorization of COVID monoclonal antibody casirivimab/imdevimab, bamlanivimab/eteseviamb, or sotrovimab.  Has a (+) direct SARS-CoV-2 viral test result  Has mild or moderate COVID-19   Is NOT hospitalized due to COVID-19  Is within 10 days of symptom onset  Has at least one of the high risk factor(s) for progression to severe COVID-19 and/or hospitalization as defined in EUA.  Specific high risk criteria : Older age (>/= 74 yo)   I have spoken and communicated the following to the patient or parent/caregiver regarding COVID monoclonal antibody treatment:  1. FDA has authorized the emergency use for the treatment of mild to moderate COVID-19 in adults and pediatric patients with positive results of direct SARS-CoV-2 viral testing who are 39 years of age and older weighing at least 40 kg, and who are at high risk for progressing to severe COVID-19 and/or hospitalization.  2. The significant known and potential risks and benefits of COVID monoclonal antibody, and the extent to which such potential risks and benefits are unknown.  3. Information on available alternative treatments and the risks and benefits of those alternatives, including clinical trials.  4. Patients treated with COVID monoclonal antibody should continue to self-isolate and use infection control measures (e.g., wear mask, isolate, social distance, avoid sharing personal items, clean and disinfect "high touch" surfaces, and frequent handwashing) according to CDC guidelines.   5. The patient or parent/caregiver has the option to accept or refuse COVID monoclonal antibody treatment.  After reviewing this information with the patient, the patient has DECLINED  offer to receive the infusion. Kathrine Haddock, NP 01/25/2020 12:14 PM  Sx onset 12/8  - Feeling better with mild symptoms

## 2020-02-18 ENCOUNTER — Telehealth: Payer: Self-pay | Admitting: *Deleted

## 2020-02-18 NOTE — Telephone Encounter (Signed)
   [facility] Pre-operative Risk Assessment    HEARTCARE STAFF: - Please ensure there is not already an duplicate clearance open for this procedure. - Under Visit Info/Reason for Call, type in Other and utilize the format Clearance MM/DD/YY or Clearance TBD. Do not use dashes or single digits. - If request is for dental extraction, please clarify the # of teeth to be extracted.  Request for surgical clearance:  1. What type of surgery is being performed? Left L2-3+L3-4 TF  ESI  2. When is this surgery scheduled? TBD  3. What type of clearance is required (medical clearance vs. Pharmacy clearance to hold med vs. Both)? both  4. Are there any medications that need to be held prior to surgery and how long?eliquis-need direction   5. Practice name and name of physician performing surgery? [facility]   6. What is the office phone number? [phone] x [phone]   7.   What is the office fax number? [phone] [id number]  8.   Anesthesia type (None, local, MAC, general) ? Not listed   Fredia Beets 02/18/2020, 5:37 PM  _________________________________________________________________   (provider comments below)

## 2020-02-21 NOTE — Telephone Encounter (Signed)
Patient with diagnosis of aflutter on Eliquis for anticoagulation.    Procedure:  Left L2-3+L3-4 TF  ESI Date of procedure: TBD    CHA2DS2-VASc Score = 4  This indicates a 4.8% annual risk of stroke. The patient's score is based upon: CHF History: No HTN History: Yes Diabetes History: Yes Stroke History: No Vascular Disease History: Yes Age Score: 1 Gender Score: 0      CrCl 46 ml/min  Per office protocol, patient can hold Eliquis for 3 days prior to procedure.

## 2020-02-21 NOTE — Telephone Encounter (Signed)
   Primary Cardiologist: Minus Breeding, MD  Chart reviewed as part of pre-operative protocol coverage. Patient was contacted 02/21/2020 in reference to pre-operative risk assessment for pending surgery as outlined below.  Gorden Stthomas was last seen on 11/15/19 by Dr. Percival Spanish.  Since that day, Tomothy Eddins has done well. He is able to complete more than 4.0 METS without angina.   Per our clinical pharmacist: Per office protocol, patient can hold Eliquis for 3 days prior to procedure.     Therefore, based on ACC/AHA guidelines, the patient would be at acceptable risk for the planned procedure without further cardiovascular testing.   The patient was advised that if he develops new symptoms prior to surgery to contact our office to arrange for a follow-up visit, and he verbalized understanding.  I will route this recommendation to the requesting party via Epic fax function and remove from pre-op pool. Please call with questions.  Tami Lin Leita Lindbloom, PA 02/21/2020, 10:49 AM

## 2020-04-21 ENCOUNTER — Other Ambulatory Visit: Payer: Self-pay | Admitting: Gastroenterology

## 2020-07-03 ENCOUNTER — Other Ambulatory Visit: Payer: Self-pay | Admitting: Cardiology

## 2020-07-03 NOTE — Telephone Encounter (Signed)
33m, 115kg, scr 1.7 07/29/19, lovw/hochrein 11/15/19

## 2020-12-11 ENCOUNTER — Other Ambulatory Visit: Payer: Self-pay | Admitting: Physical Medicine and Rehabilitation

## 2020-12-11 DIAGNOSIS — M545 Low back pain, unspecified: Secondary | ICD-10-CM

## 2020-12-24 ENCOUNTER — Ambulatory Visit
Admission: RE | Admit: 2020-12-24 | Discharge: 2020-12-24 | Disposition: A | Discharge status: Home / Self Care | Payer: Medicare Other | Source: Ambulatory Visit | Attending: Physical Medicine and Rehabilitation | Admitting: Physical Medicine and Rehabilitation

## 2020-12-24 ENCOUNTER — Other Ambulatory Visit: Payer: Self-pay

## 2020-12-24 DIAGNOSIS — M545 Low back pain, unspecified: Secondary | ICD-10-CM

## 2020-12-24 IMAGING — MR MR LUMBAR SPINE W/O CM
4 of 5 series · 26 of 48 positions shown · non-contrast
Comparison: MRI lumbar spine dated [DATE].

CLINICAL DATA: Chronic low back pain. History of prior surgery in
[5J].

EXAM:
MRI LUMBAR SPINE WITHOUT CONTRAST
TECHNIQUE: Multiplanar, multisequence MR imaging of the lumbar spine was
performed. No intravenous contrast was administered.

[Series 3: T2 · sagittal · 4.0mm · 0.53mm/px · 8 of 19 slices shown (1 of 2)]
[im 1/19]
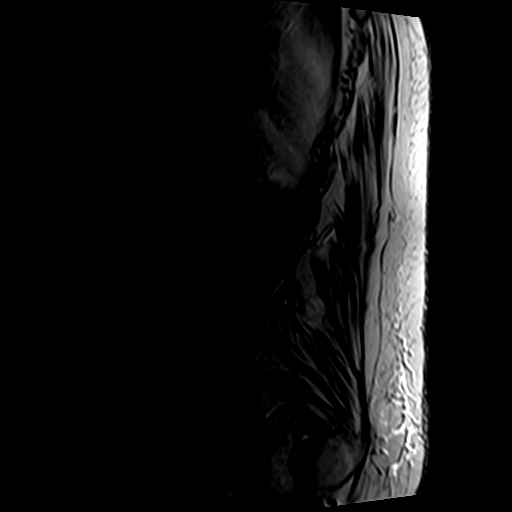
[im 3/19]
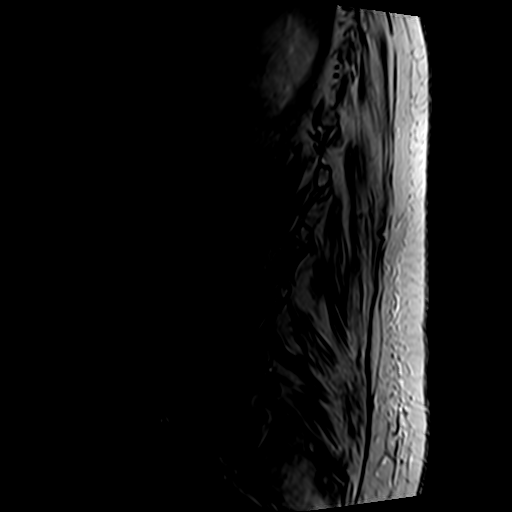
[im 6/19]
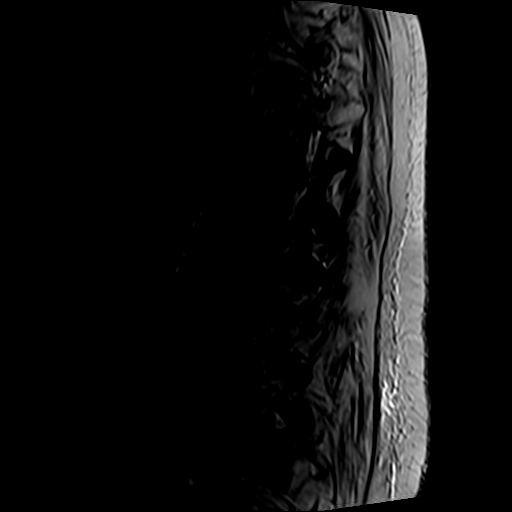
[im 8/19]
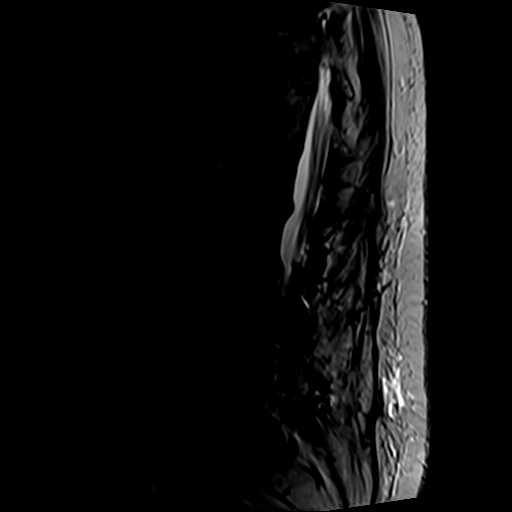
[im 11/19]
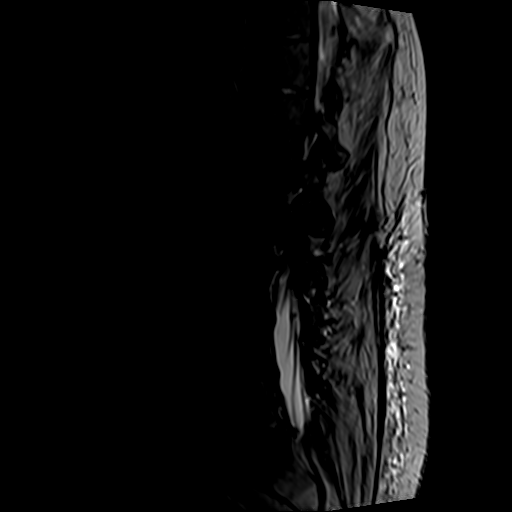
[im 13/19]
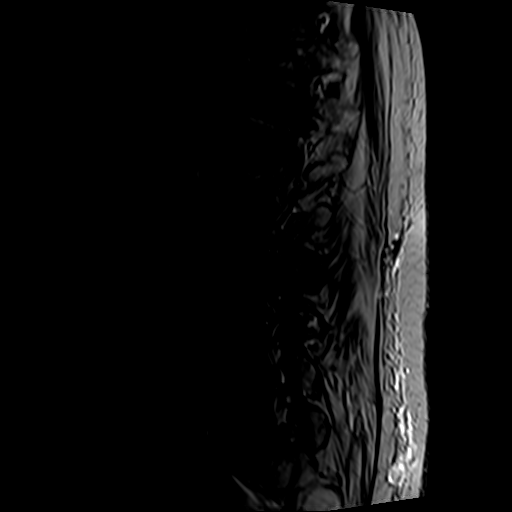
[im 16/19]
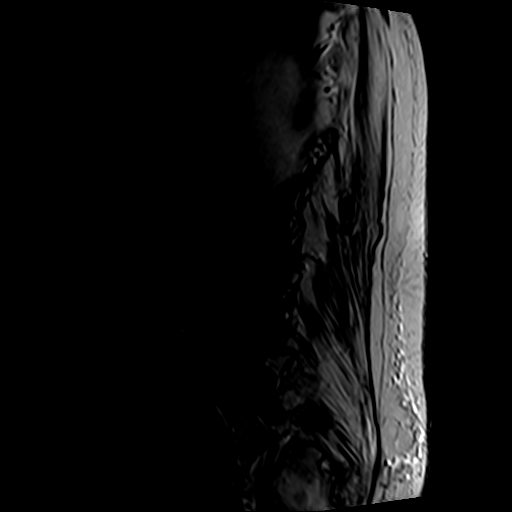
[im 19/19]
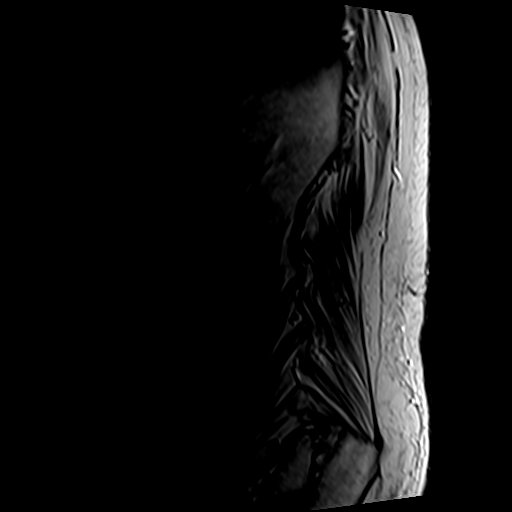

[Series 5: T1 · sagittal · 4.0mm · 0.53mm/px · 6 of 19 slices shown (1 of 2)]
[im 1/19]
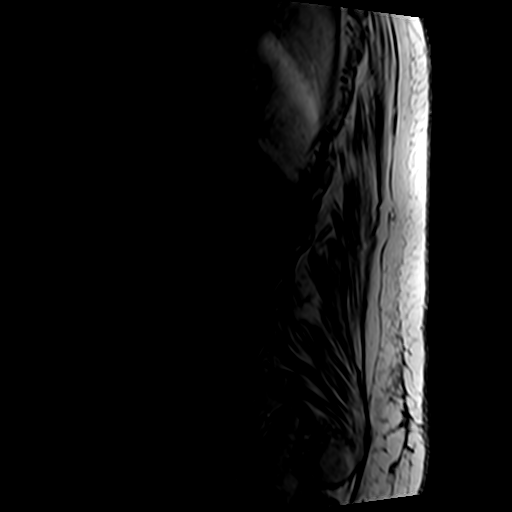
[im 4/19]
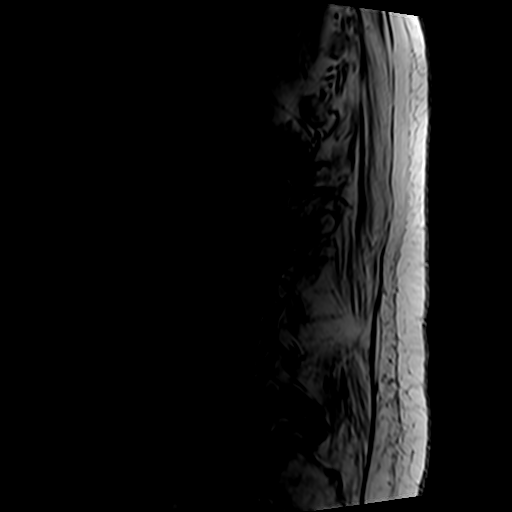
[im 7/19]
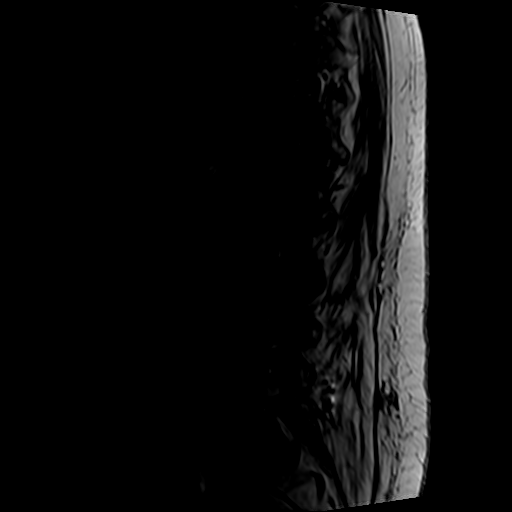
[im 10/19]
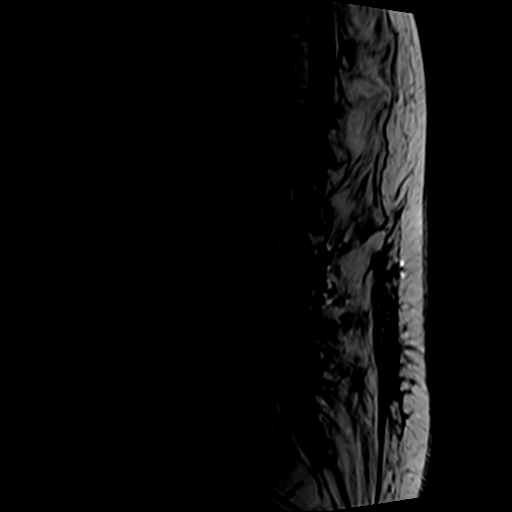
[im 13/19]
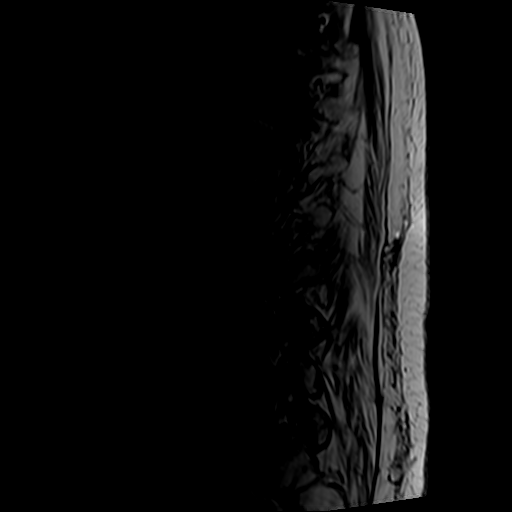
[im 16/19]
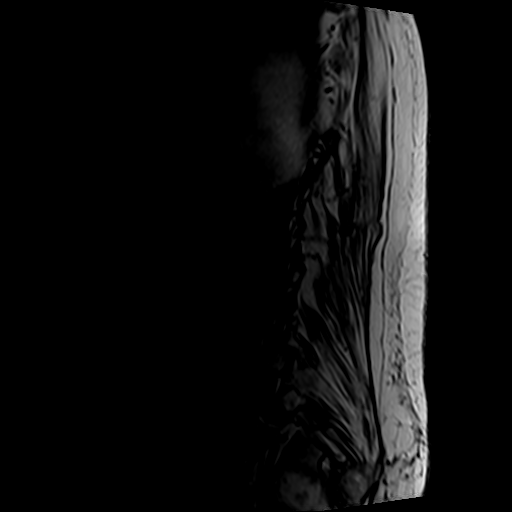

[Series 6: T2 · axial · 4.0mm · 0.70mm/px · z∈[-102,+101]mm · 9 of 34 slices shown (2 of 2)]
[im 1/34]
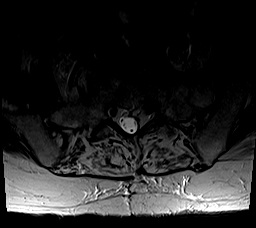
[im 6/34]
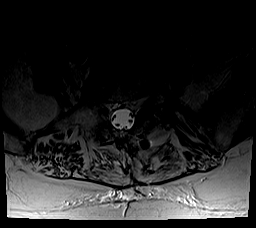
[im 12/34]
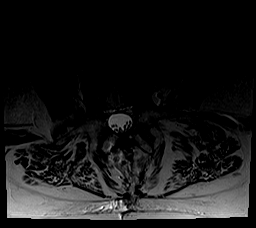
[im 14/34]
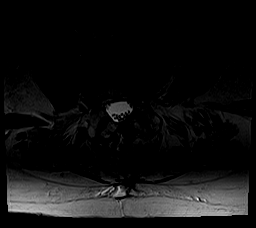
[im 17/34]
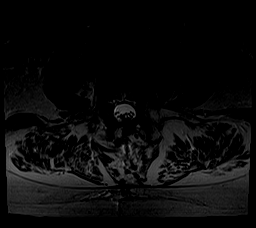
[im 20/34]
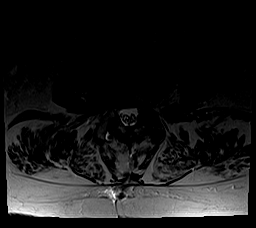
[im 23/34]
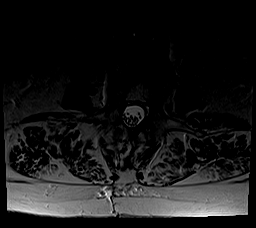
[im 28/34]
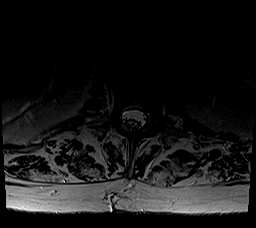
[im 34/34]
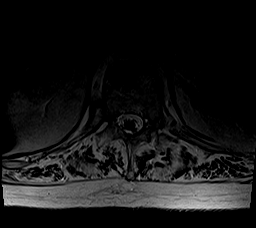

[Series 7: T1 · axial · 4.0mm · 0.35mm/px · z∈[-77,+70]mm · 3 of 34 slices shown (2 of 2)]
[im 6/34]
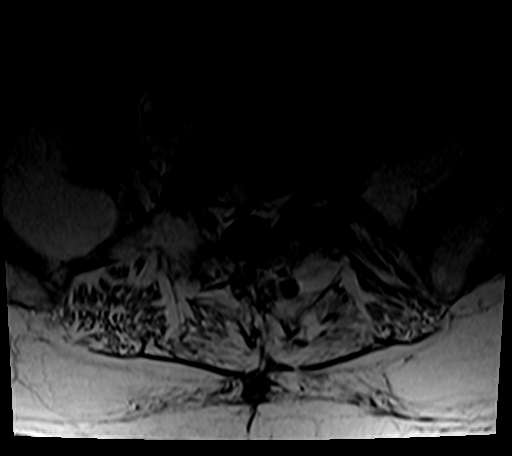
[im 17/34]
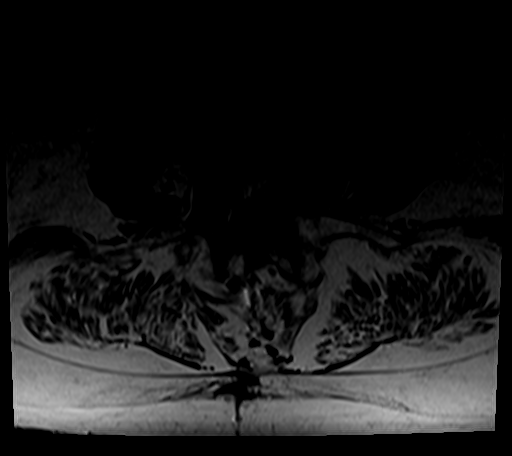
[im 28/34]
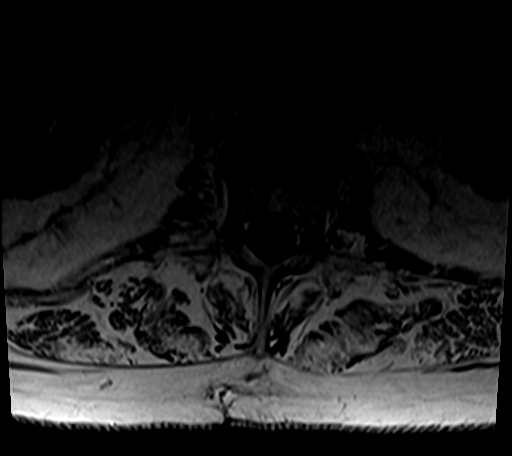

[26 of 48 positions shown; findings below may reference images not displayed]

FINDINGS: Segmentation:  Standard.

Alignment:  Unchanged scoliosis.  No significant listhesis.

Vertebrae:  No fracture, evidence of discitis, or bone lesion.

Conus medullaris and cauda equina: Conus extends to the L1 level.
Conus and cauda equina appear normal.

Paraspinal and other soft tissues: Bilateral renal cysts. Otherwise
negative.

Disc levels:

T12-L1:  Unchanged minimal disc bulging.  No stenosis.

L1-L2: Unchanged mild disc bulging and endplate spurring eccentric
to the right, with superimposed small right subarticular disc
protrusion. Unchanged mild right lateral recess stenosis. No spinal
canal or neuroforaminal stenosis.

L2-L3: Prior posterior decompression. Unchanged mild diffuse disc
bulging and asymmetric right-sided endplate spurring. Unchanged mild
left lateral recess stenosis. No spinal canal or neuroforaminal
stenosis.

L3-L4: Prior posterior decompression. Unchanged minimal posterior
endplate spurring. No stenosis.

L4-L5: Prior posterior decompression. Unchanged partial interbody
ankylosis on the left. No stenosis.

L5-S1: Prior posterior decompression. Unchanged shallow right-sided
disc protrusion with right subarticular annular fissure. No
stenosis.
IMPRESSION: 1. Unchanged multilevel lumbar spondylosis and postsurgical change.
No high-grade stenosis or impingement.

## 2021-01-08 ENCOUNTER — Other Ambulatory Visit: Payer: Self-pay

## 2021-01-08 MED ORDER — APIXABAN 5 MG PO TABS: 5.0000 mg | ORAL_TABLET | Freq: Two times a day (BID) | ORAL | 0 refills | Status: DC

## 2021-01-08 NOTE — Telephone Encounter (Signed)
Prescription refill request for Eliquis received. Indication:Aflutter Last office visit:Needs Appointment TVN:[id number] Age: 75 Weight:115 kg  Prescription refilled

## 2021-01-25 ENCOUNTER — Emergency Department (HOSPITAL_COMMUNITY): Payer: Medicare Other

## 2021-01-25 ENCOUNTER — Other Ambulatory Visit: Payer: Self-pay

## 2021-01-25 ENCOUNTER — Emergency Department (HOSPITAL_COMMUNITY)
Admission: EM | Admit: 2021-01-25 | Discharge: 2021-01-25 | Disposition: A | Discharge status: Home / Self Care | Payer: Medicare Other | Attending: Emergency Medicine | Admitting: Emergency Medicine

## 2021-01-25 ENCOUNTER — Encounter (HOSPITAL_COMMUNITY): Payer: Self-pay | Admitting: *Deleted

## 2021-01-25 DIAGNOSIS — Z87891 Personal history of nicotine dependence: Secondary | ICD-10-CM | POA: Insufficient documentation

## 2021-01-25 DIAGNOSIS — N184 Chronic kidney disease, stage 4 (severe): Secondary | ICD-10-CM | POA: Insufficient documentation

## 2021-01-25 DIAGNOSIS — Z7901 Long term (current) use of anticoagulants: Secondary | ICD-10-CM | POA: Diagnosis not present

## 2021-01-25 DIAGNOSIS — Z96641 Presence of right artificial hip joint: Secondary | ICD-10-CM | POA: Diagnosis not present

## 2021-01-25 DIAGNOSIS — M545 Low back pain, unspecified: Secondary | ICD-10-CM | POA: Insufficient documentation

## 2021-01-25 DIAGNOSIS — M25551 Pain in right hip: Secondary | ICD-10-CM | POA: Insufficient documentation

## 2021-01-25 DIAGNOSIS — I129 Hypertensive chronic kidney disease with stage 1 through stage 4 chronic kidney disease, or unspecified chronic kidney disease: Secondary | ICD-10-CM | POA: Diagnosis not present

## 2021-01-25 DIAGNOSIS — Z79899 Other long term (current) drug therapy: Secondary | ICD-10-CM | POA: Diagnosis not present

## 2021-01-25 DIAGNOSIS — Z85828 Personal history of other malignant neoplasm of skin: Secondary | ICD-10-CM | POA: Diagnosis not present

## 2021-01-25 DIAGNOSIS — I2510 Atherosclerotic heart disease of native coronary artery without angina pectoris: Secondary | ICD-10-CM | POA: Diagnosis not present

## 2021-01-25 DIAGNOSIS — R109 Unspecified abdominal pain: Secondary | ICD-10-CM | POA: Diagnosis not present

## 2021-01-25 DIAGNOSIS — M549 Dorsalgia, unspecified: Secondary | ICD-10-CM | POA: Diagnosis present

## 2021-01-25 DIAGNOSIS — E1122 Type 2 diabetes mellitus with diabetic chronic kidney disease: Secondary | ICD-10-CM | POA: Diagnosis not present

## 2021-01-25 DIAGNOSIS — I4892 Unspecified atrial flutter: Secondary | ICD-10-CM | POA: Insufficient documentation

## 2021-01-25 DIAGNOSIS — Z96653 Presence of artificial knee joint, bilateral: Secondary | ICD-10-CM | POA: Diagnosis not present

## 2021-01-25 LAB — BASIC METABOLIC PANEL
Anion gap: 10 (ref 5–15)
BUN: 34 mg/dL — ABNORMAL HIGH (ref 8–23)
CO2: 21 mmol/L — ABNORMAL LOW (ref 22–32)
Calcium: 9 mg/dL (ref 8.9–10.3)
Chloride: 107 mmol/L (ref 98–111)
Creatinine, Ser: 2.18 mg/dL — ABNORMAL HIGH (ref 0.61–1.24)
GFR, Estimated: 31 mL/min — ABNORMAL LOW (ref >60)
Glucose, Bld: 147 mg/dL — ABNORMAL HIGH (ref 70–99)
Potassium: 4.7 mmol/L (ref 3.5–5.1)
Sodium: 138 mmol/L (ref 135–145)

## 2021-01-25 LAB — CBC WITH DIFFERENTIAL/PLATELET
Abs Immature Granulocytes: 0.02 10*3/uL (ref 0.00–0.07)
Basophils Absolute: 0.1 10*3/uL (ref 0.0–0.1)
Basophils Relative: 1 %
Eosinophils Absolute: 0.1 10*3/uL (ref 0.0–0.5)
Eosinophils Relative: 1 %
HCT: 35.5 % — ABNORMAL LOW (ref 39.0–52.0)
Hemoglobin: 11.3 g/dL — ABNORMAL LOW (ref 13.0–17.0)
Immature Granulocytes: 0 %
Lymphocytes Relative: 16 %
Lymphs Abs: 1.2 10*3/uL (ref 0.7–4.0)
MCH: 33.2 pg (ref 26.0–34.0)
MCHC: 31.8 g/dL (ref 30.0–36.0)
MCV: 104.4 fL — ABNORMAL HIGH (ref 80.0–100.0)
Monocytes Absolute: 0.6 10*3/uL (ref 0.1–1.0)
Monocytes Relative: 9 %
Neutro Abs: 5.4 10*3/uL (ref 1.7–7.7)
Neutrophils Relative %: 73 %
Platelets: 154 10*3/uL (ref 150–400)
RBC: 3.4 MIL/uL — ABNORMAL LOW (ref 4.22–5.81)
RDW: 13.3 % (ref 11.5–15.5)
WBC: 7.4 10*3/uL (ref 4.0–10.5)
nRBC: 0 % (ref 0.0–0.2)

## 2021-01-25 LAB — URINALYSIS, MICROSCOPIC (REFLEX)
Bacteria, UA: NONE SEEN
Squamous Epithelial / HPF: NONE SEEN (ref 0–5)

## 2021-01-25 LAB — URINALYSIS, ROUTINE W REFLEX MICROSCOPIC
Bilirubin Urine: NEGATIVE
Glucose, UA: >=500 mg/dL — AB
Ketones, ur: NEGATIVE mg/dL
Leukocytes,Ua: NEGATIVE
Nitrite: NEGATIVE
Protein, ur: 100 mg/dL — AB
Specific Gravity, Urine: 1.025 (ref 1.005–1.030)
pH: 6 (ref 5.0–8.0)

## 2021-01-25 IMAGING — CT CT RENAL STONE PROTOCOL
2 of 4 series · 16 of 46 positions shown, 18 images · non-contrast
Comparison: MRI [DATE]

CLINICAL DATA: Right flank pain.  Renal stone suspected.

EXAM:
CT ABDOMEN AND PELVIS WITHOUT CONTRAST
TECHNIQUE: Multidetector CT imaging of the abdomen and pelvis was performed
following the standard protocol without IV contrast.

[Series 3: renal stone 5.0 · axial · 0.98mm/px · z∈[+856,+1291]mm · 13 of 95 slices shown, 15 images]
[im 4/95  soft-tissue]
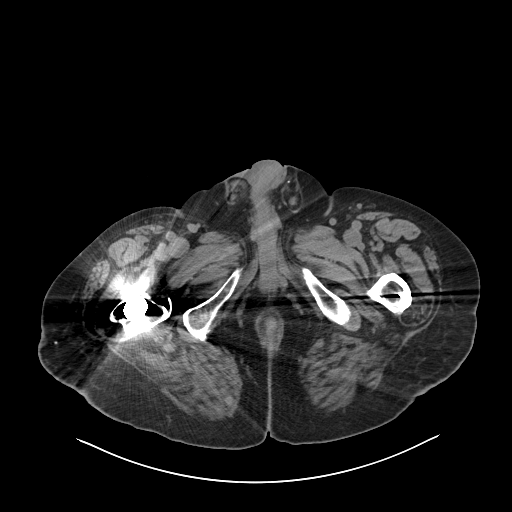
[im 4/95  bone]
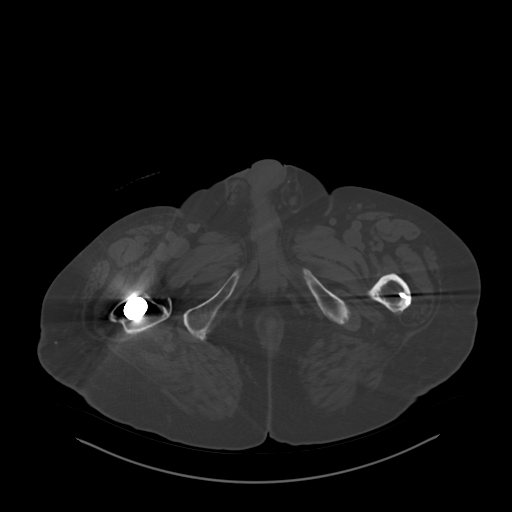
[im 12/95  soft-tissue]
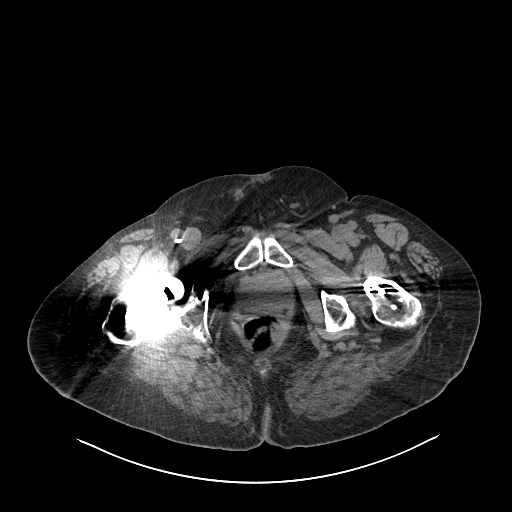
[im 19/95  soft-tissue]
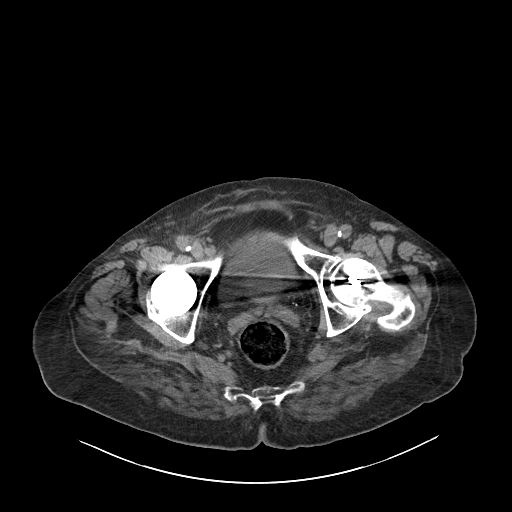
[im 27/95  soft-tissue]
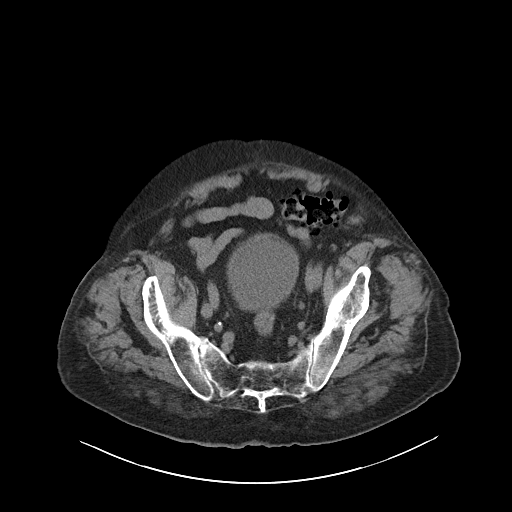
[im 34/95  soft-tissue]
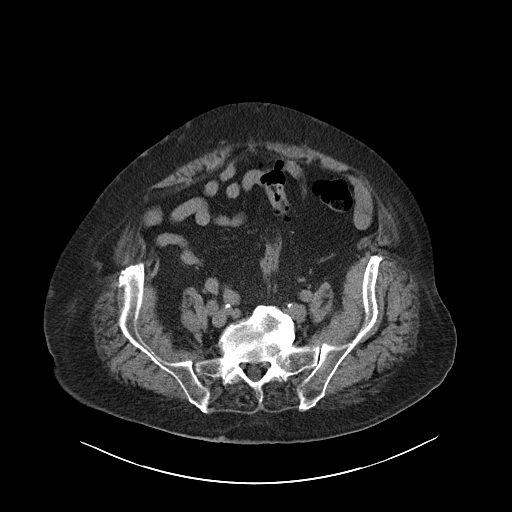
[im 42/95  soft-tissue]
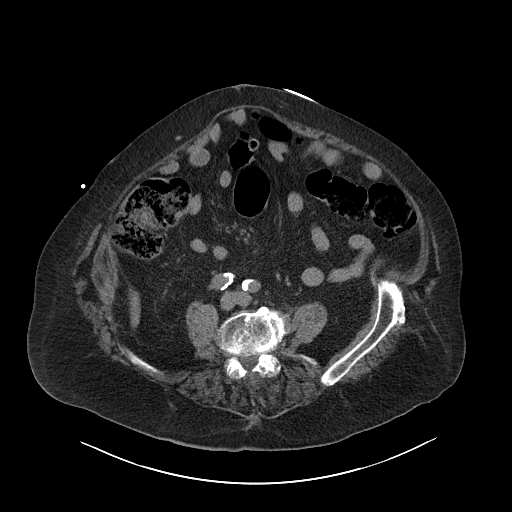
[im 49/95  soft-tissue]
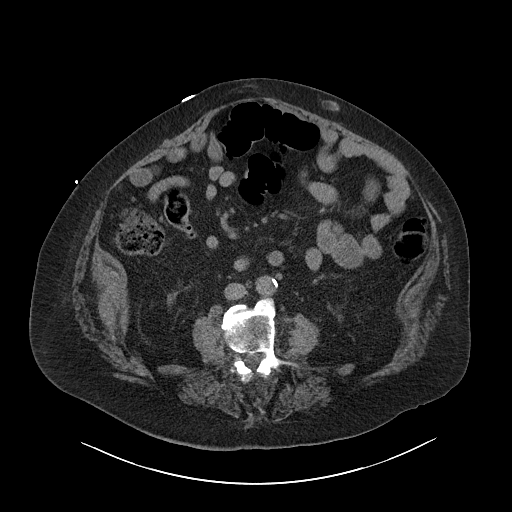
[im 53/95  soft-tissue]
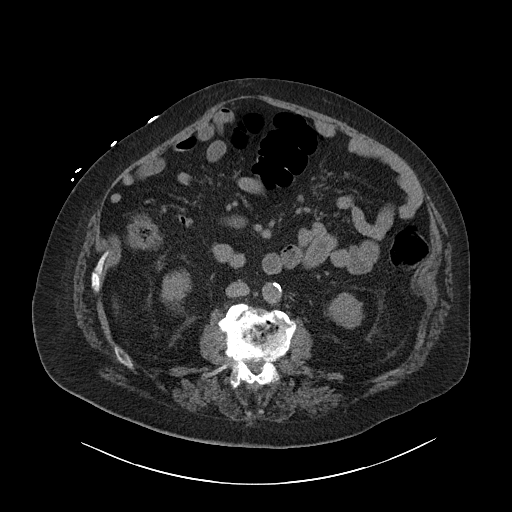
[im 61/95  soft-tissue]
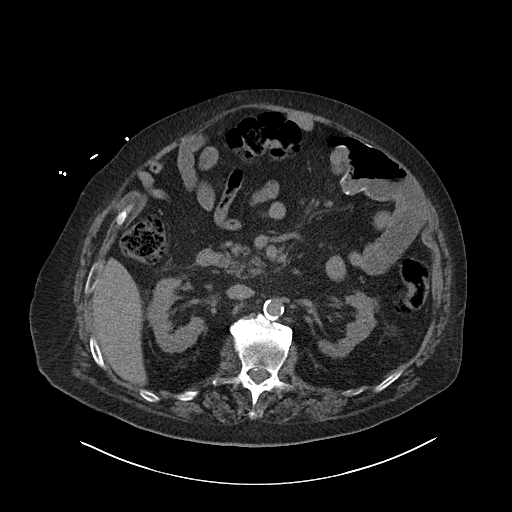
[im 61/95  bone]
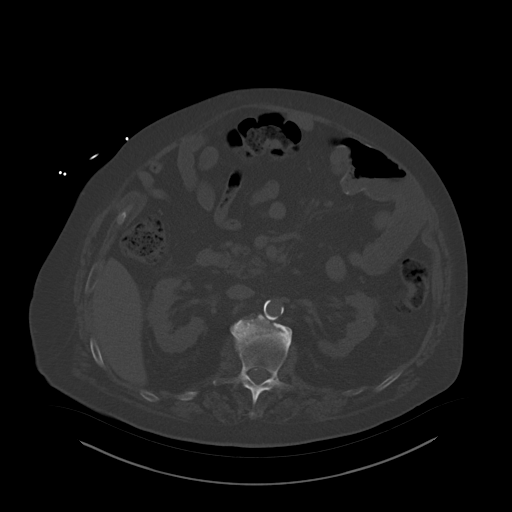
[im 68/95  soft-tissue]
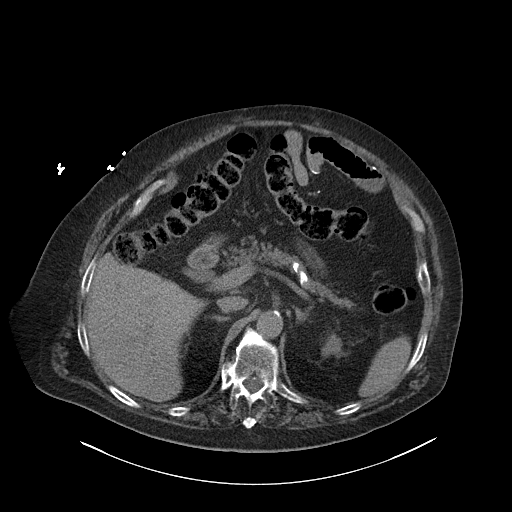
[im 76/95  soft-tissue]
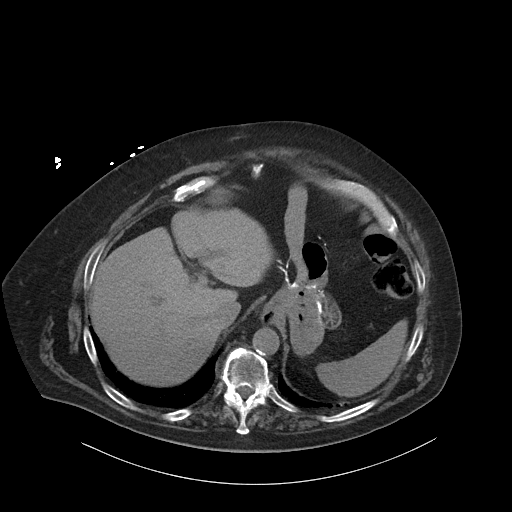
[im 83/95  soft-tissue]
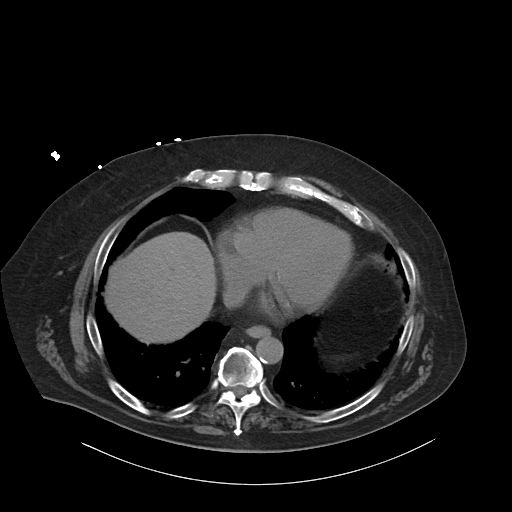
[im 91/95  soft-tissue]
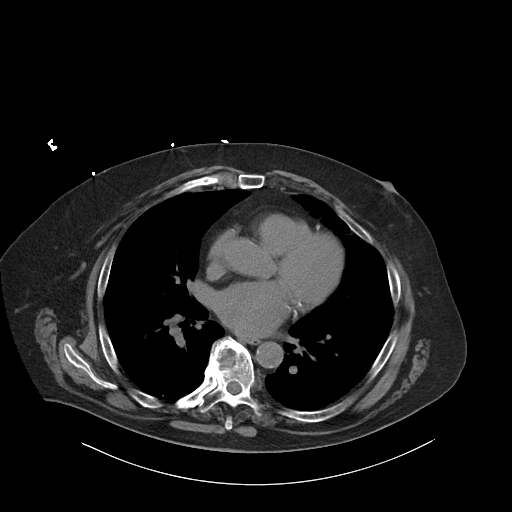

[Series 6: cor · coronal · 0.92mm/px · 3 of 205 slices shown]
[im 69/205  soft-tissue]
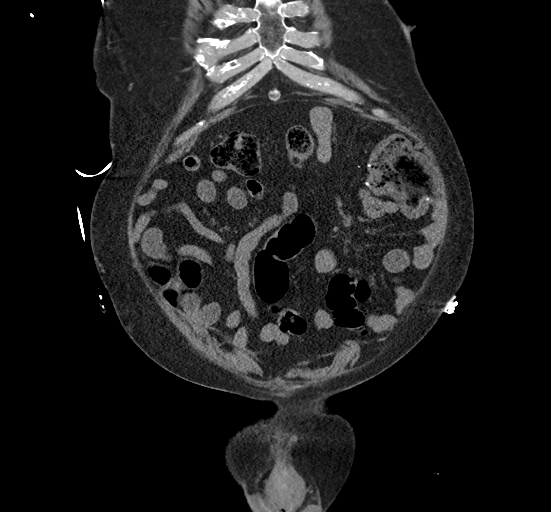
[im 91/205  soft-tissue]
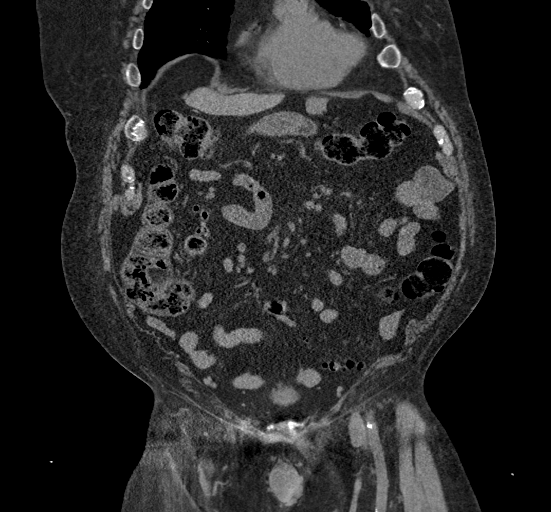
[im 114/205  soft-tissue]
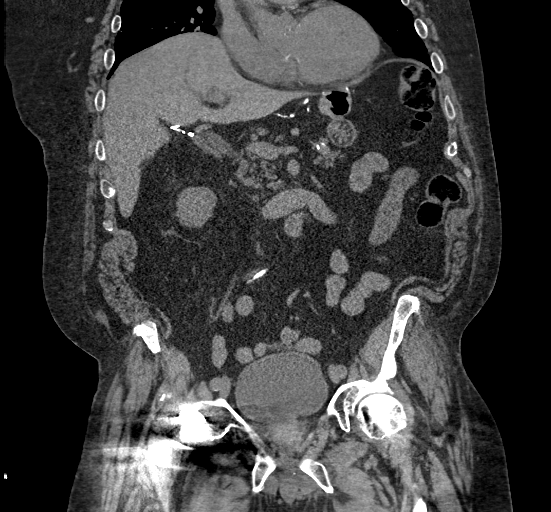

[16 of 46 positions shown; findings below may reference images not displayed]

FINDINGS: Lower chest: No acute abnormality. Three-vessel coronary artery
calcifications.

Hepatobiliary: No suspicious hepatic lesion on this noncontrast
examination. Gallbladder surgically absent. Prominence of the
biliary tree is favored reservoir effect post cholecystectomy.

Pancreas: No pancreatic ductal dilation or evidence of acute
inflammation.

Spleen: Within normal limits.

Adrenals/Urinary Tract: Bilateral adrenal glands are unremarkable.
Nonobstructive bilateral renal stones measuring up to 4 mm on the
left. No obstructive ureteral or bladder calculi identified.
Peripherally calcified 13 mm left renal cyst. 2.3 cm left lower pole
renal cyst. Urinary bladder is unremarkable for degree of
distension.

Stomach/Bowel: No enteric contrast was administered. Postsurgical
change of prior gastric bypass. No pathologic dilation of small or
large bowel. The appendix and terminal ileum appear normal. Colonic
diverticulosis without findings of acute diverticulitis.

Vascular/Lymphatic: Aortic and branch vessel atherosclerosis without
abdominal aortic aneurysm. No pathologically enlarged abdominal or
pelvic lymph nodes.

Reproductive: Within normal limits, although evaluation is limited
by streak artifact from bilateral hip hardware.

Other: No significant abdominopelvic free fluid. Laxity of the
anterior abdominal wall. Fat containing left inguinal hernia.

Musculoskeletal: Right total hip arthroplasty. Fixation hardware in
the left femoral head. Advanced multilevel degenerative changes
spine. Degenerative changes the bilateral SI joints. No acute
osseous abnormality.
IMPRESSION: 1. Nonobstructive bilateral renal stones measuring up to 4 mm on the
left. No obstructive ureteral or bladder calculi identified.
2. Colonic diverticulosis without findings of acute diverticulitis.
3.  Aortic Atherosclerosis ([IM]-[IM]).

## 2021-01-25 IMAGING — CR DG HIP (WITH OR WITHOUT PELVIS) 2-3V*R*
3 series · 3 of 3 positions shown · non-contrast
Comparison: None.

CLINICAL DATA: Status post hip replacement, right hip pain for 4
days, no injury

EXAM:
DG HIP (WITH OR WITHOUT PELVIS) 2-3V RIGHT

[pelvis ap]
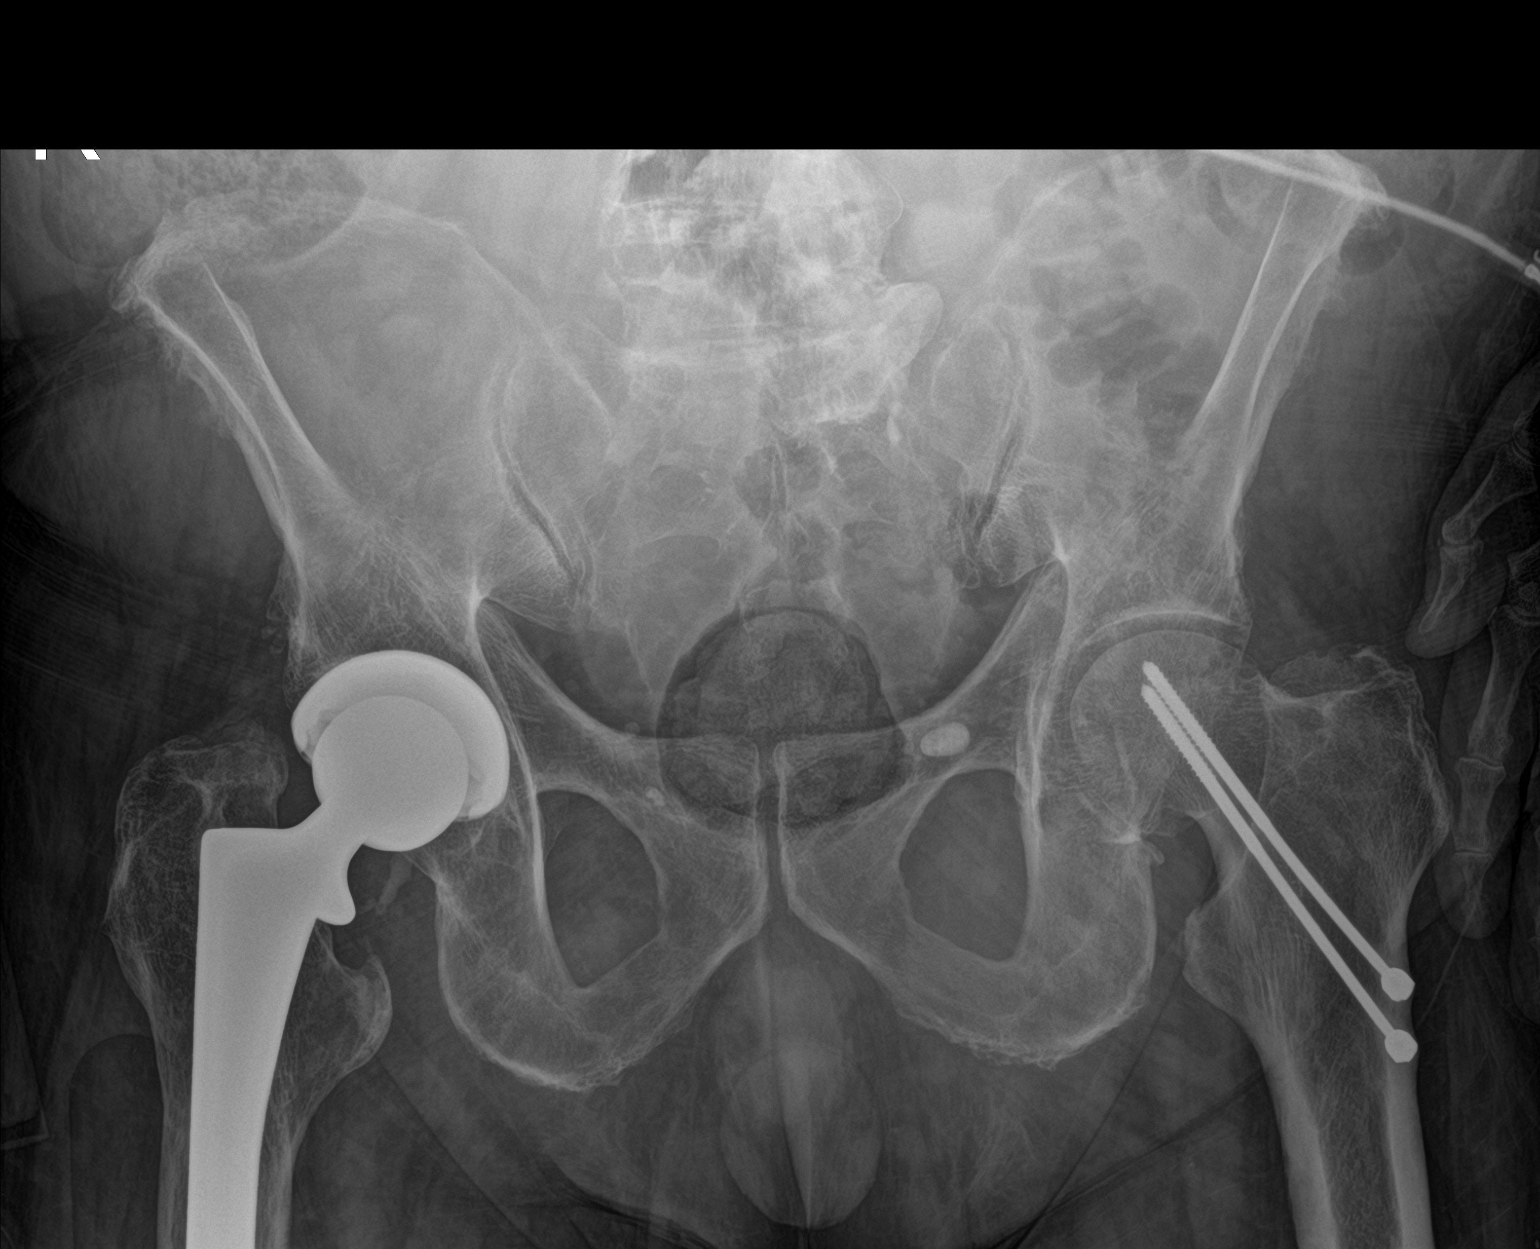

[hip ap]
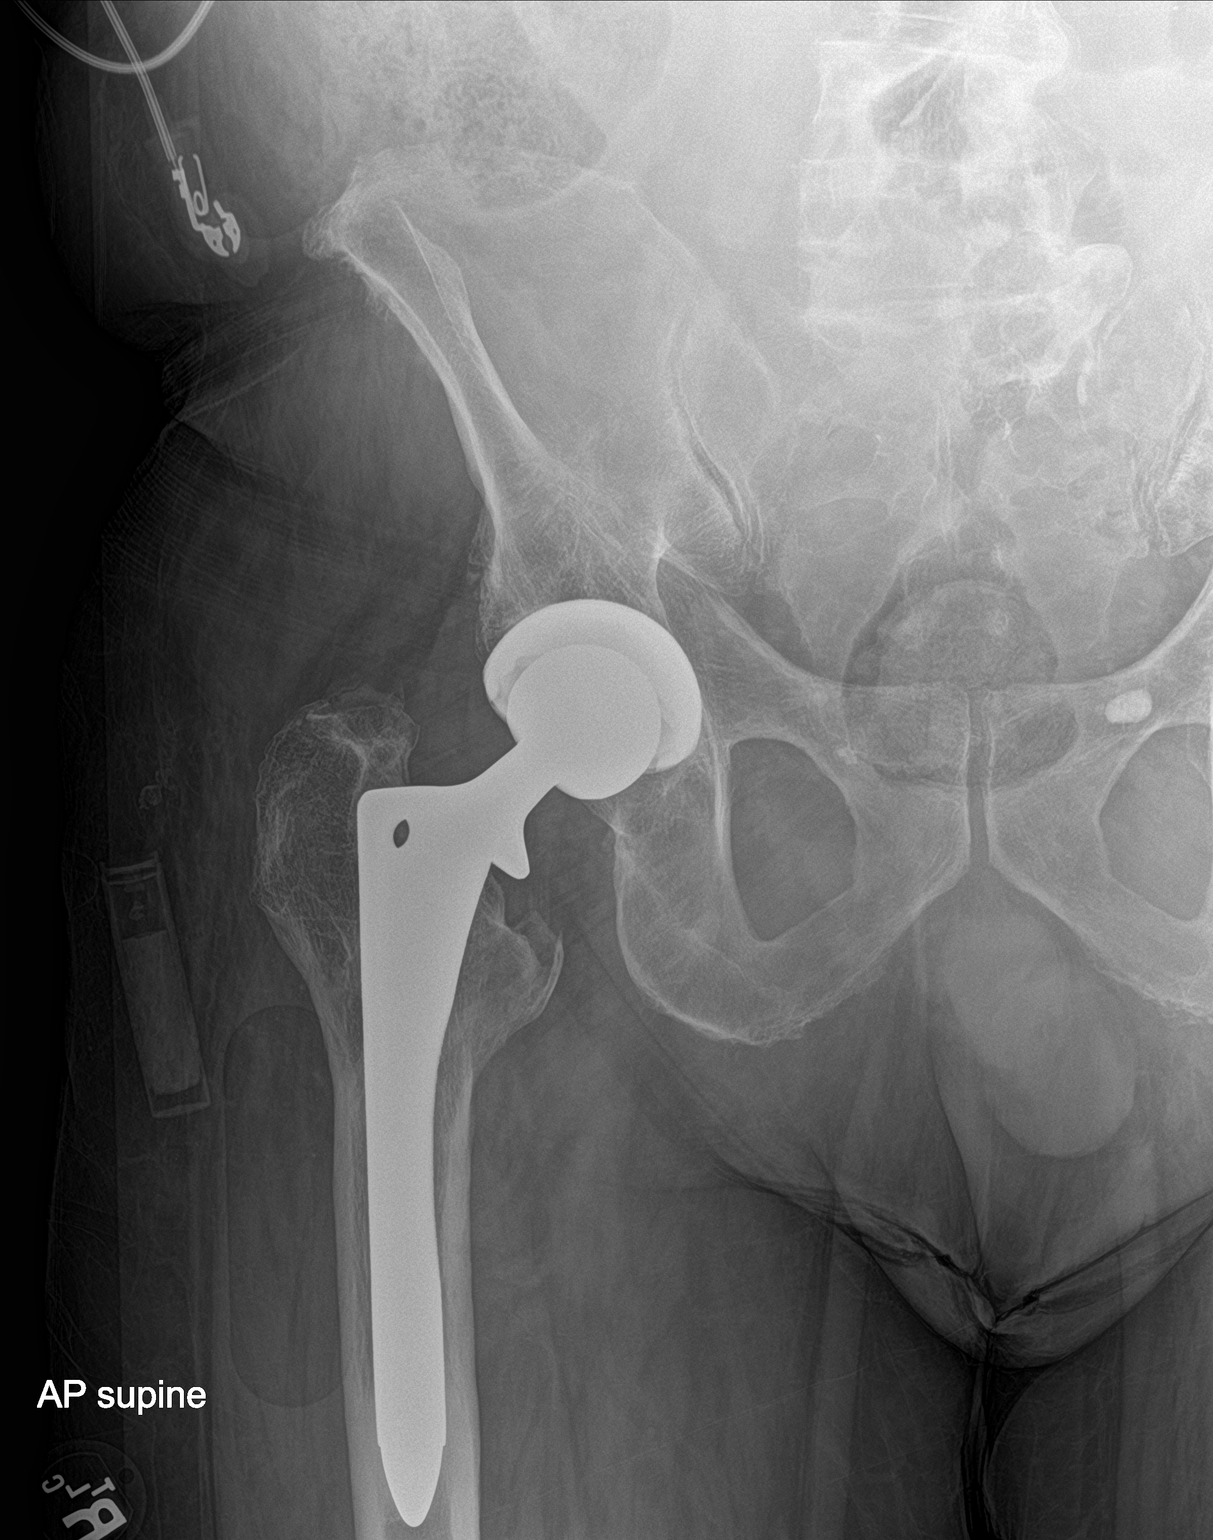

[hip lat]
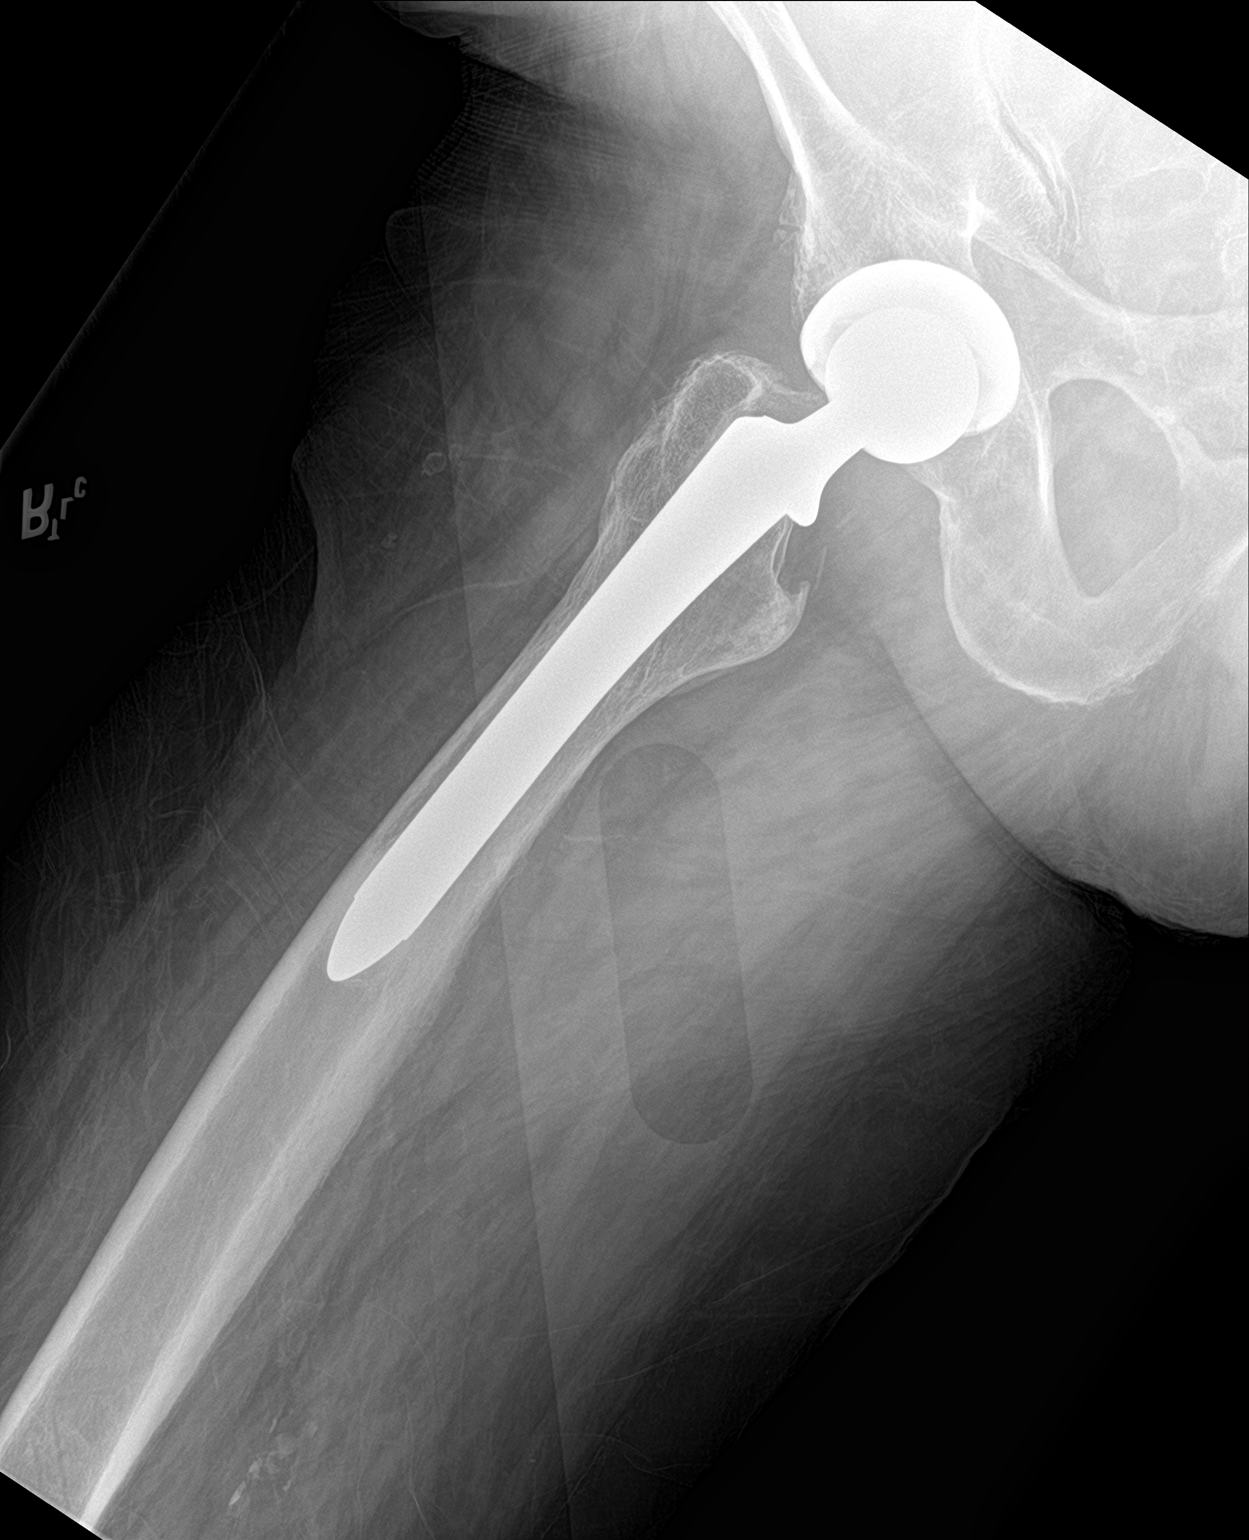

[3 of 3 positions shown; findings below may reference images not displayed]

FINDINGS: Status post right hip total arthroplasty. No evidence of
perihardware fracture or component loosening. No displaced fracture
or dislocation of the pelvis and proximal left femur seen in frontal
view only. Screw fixation of the left femoral neck.
IMPRESSION: 1. Status post right hip total arthroplasty. No evidence of
perihardware fracture or component loosening.

2. No displaced fracture or dislocation of the pelvis and proximal
left femur seen in frontal view only.

## 2021-01-25 MED ORDER — OXYCODONE-ACETAMINOPHEN 5-325 MG PO TABS
1.0000 | ORAL_TABLET | Freq: Once | ORAL | Status: AC
  Administered 2021-01-25: 1 via ORAL
  Filled 2021-01-25: qty 1

## 2021-01-25 MED ORDER — SODIUM CHLORIDE 0.9 % IV SOLN: INTRAVENOUS | Status: DC

## 2021-01-25 MED ORDER — ONDANSETRON HCL 4 MG/2ML IJ SOLN
4.0000 mg | Freq: Once | INTRAMUSCULAR | Status: AC
  Administered 2021-01-25: 4 mg via INTRAVENOUS
  Filled 2021-01-25: qty 2

## 2021-01-25 MED ORDER — HYDROMORPHONE HCL 1 MG/ML IJ SOLN
1.0000 mg | Freq: Once | INTRAMUSCULAR | Status: AC
  Administered 2021-01-25: 1 mg via INTRAVENOUS
  Filled 2021-01-25: qty 1

## 2021-01-25 MED ORDER — HYDROCODONE-ACETAMINOPHEN 5-325 MG PO TABS: 1.0000 | ORAL_TABLET | Freq: Four times a day (QID) | ORAL | 0 refills | Status: AC | PRN

## 2021-01-25 NOTE — ED Provider Notes (Signed)
MSE was initiated and I personally evaluated the patient and placed orders (if any) at  6:33 AM on January 25, 2021.  Patient to ED with right sided flank and low back nonradiating pain. History of lumbar radiculopathy that is different from current pain. History of kidney stones. No fever, vomiting, groin pain, urinary symptoms.   Tender over right lateral back from flank to buttock.  Aged bruise to right lateral abdominal wall Abdomen nontender.  The patient appears stable so that the remainder of the MSE may be completed by another provider.   Charlann Lange, PA-C 01/25/21 [id number]    Quintella Reichert, MD 01/25/21 [phone]

## 2021-01-25 NOTE — Discharge Instructions (Signed)
Take the hydrocodone as needed for additional pain control.  Make an appointment to follow back up with [facility].  Also make an appointment to follow back up with your primary care doctor in a week or 2 to have your kidney function rechecked.  Kidney function a little bit worse than your baseline today.  Return for any new or worse symptoms.

## 2021-01-25 NOTE — ED Triage Notes (Signed)
Patient presents to ed c/o right flank pain  on set several days ago , worse Sunday and yest. Denies n/v or urinary sx. States the pain is worse with movement.

## 2021-01-25 NOTE — ED Provider Notes (Signed)
[facility] EMERGENCY DEPARTMENT Provider Note   CSN: [id number] Arrival date & time: 01/25/21  6270     History Chief Complaint  Patient presents with   Back Pain    Carlos Moreno is a 75 y.o. male.  Patient with a complaint of right back pain right flank pain and right hip pain for a week but got significantly worse last night.  Is worse with any kind of movement at the back or leg area.  No pain radiating down the back of the leg no numbness or weakness to the right foot.  Patient is on Eliquis past medical history significant for hypertension coronary artery disease kidney stones atrial flutter with rapid ventricular response type 2 diabetes and chronic kidney disease stage III.  No nausea or vomiting.      Past Medical History:  Diagnosis Date   Anemia    Atrial flutter with rapid ventricular response ([place]) 07/27/2019   Basal cell carcinoma    CAD (coronary artery disease) 2005    PCI to mid LAD with a 3.0 x 15 mm Resolute Integrity DES May 2017. LAD Stent 2005 (Details not available)   Cataract    CKD stage 3 due to type 2 diabetes mellitus ([place]) 07/27/2019   Curvature of spine    Diabetes mellitus type 2, diet-controlled ([place]) 07/27/2019   Gallstones    Hypertension    Kidney stones    Macular degeneration    Obesity    OSA (obstructive sleep apnea)    CPAP    Patient Active Problem List   Diagnosis Date Noted   Hyperkalemia 11/14/2019   Atrial flutter ([place]) 07/27/2019   Diabetes mellitus type 2, diet-controlled ([place]) 07/27/2019   CKD stage 3 due to type 2 diabetes mellitus ([place]) 07/27/2019   Chronic kidney disease (CKD), stage IV (severe) (HCC)    OSA on CPAP    Macrocytosis without anemia    Coronary artery disease involving native heart without angina pectoris 06/09/2018   Essential hypertension 06/09/2018   Dyslipidemia 06/09/2018   Educated about COVID-19 virus infection 06/09/2018    Past Surgical History:  Procedure Laterality  Date   ABDOMINOPLASTY  02/2009   CARDIOVERSION N/A 07/29/2019   Procedure: CARDIOVERSION;  Surgeon: Donato Heinz, MD;  Location: [place];  Service: Cardiovascular;  Laterality: N/A;   CATARACT EXTRACTION, BILATERAL  03/2007   CHOLECYSTECTOMY  2001   COLONOSCOPY  08/07 11/12 04/16    CORONARY ANGIOPLASTY WITH STENT PLACEMENT  07/2003   Stent LAD, 06/2015 DES LAD   HIP SURGERY Left    INGUINAL HERNIA REPAIR Left 1956   KNEE ARTHROSCOPY Right 05/2005   x3   LAMINECTOMY  2009   Lumbar   REPLACEMENT TOTAL KNEE Left 1994   REPLACEMENT TOTAL KNEE Right    x 3   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   TEE WITHOUT CARDIOVERSION N/A 07/29/2019   Procedure: TRANSESOPHAGEAL ECHOCARDIOGRAM (TEE);  Surgeon: Donato Heinz, MD;  Location[facility] ENDOSCOPY;  Service: Cardiovascular;  Laterality: N/A;   TOTAL HIP ARTHROPLASTY Right 01/2007       Family History  Problem Relation Age of Onset   Bladder Cancer Mother    Rheum arthritis Mother    Colon cancer Father 64   Diabetes Paternal Grandmother    Tuberculosis Paternal Grandfather    Esophageal cancer Neg Hx    Rectal cancer Neg Hx     Social History   Tobacco Use   Smoking status: Former  Types: Pipe   Smokeless tobacco: Never  Vaping Use   Vaping Use: Never used  Substance Use Topics   Alcohol use: Yes    Comment: highball and wine daily   Drug use: Never    Home Medications Prior to Admission medications   Medication Sig Start Date End Date Taking? Authorizing Provider  apixaban (ELIQUIS) 5 MG TABS tablet Take 1 tablet (5 mg total) by mouth 2 (two) times daily. 01/08/21   Minus Breeding, MD  atorvastatin (LIPITOR) 40 MG tablet Take 40 mg by mouth daily. 12/19/16   [provider]  b complex vitamins capsule Take 1 capsule by mouth every other day.     [provider]  Calcium-Phosphorus-Vitamin D (CALCIUM GUMMIES PO) Take 2 tablets by mouth daily. Takes two daily     [provider]  cetirizine (ZYRTEC) 10 MG tablet Take 10 mg by mouth daily.    [provider]  Cyanocobalamin (B-12) 5000 MCG SUBL Place 1 tablet under the tongue every other day.     [provider]  famotidine (PEPCID) 20 MG tablet TAKE ONE TABLET BY MOUTH TWICE A DAY 04/24/20   Mauri Pole, MD  isosorbide mononitrate (IMDUR) 30 MG 24 hr tablet Take 30 mg by mouth daily. 12/19/16   [provider]  JARDIANCE 25 MG TABS tablet Take 1 tablet (25 mg total) by mouth daily. Do not restart until you follow up with your PCP 07/29/19   Buford Dresser, MD  losartan (COZAAR) 25 MG tablet Take 25 mg by mouth daily. 11/03/19   [provider]  Melatonin ER 5 MG TBCR Take 5 mg by mouth every evening.     [provider]  Multiple Vitamins-Minerals (VISION FORMULA/LUTEIN) TABS Take 1 tablet by mouth daily.  10/10/14   [provider]  nebivolol (BYSTOLIC) 10 MG tablet Take 10 mg by mouth daily.    [provider]  nitroGLYCERIN (NITROSTAT) 0.4 MG SL tablet Place 1 tablet (0.4 mg total) under the tongue every 5 (five) minutes as needed for chest pain. 05/25/19 11/15/19  Minus Breeding, MD  Polysaccharide Iron Complex (IRON UP PO) Take 120 mg by mouth daily.    [provider]  tiZANidine (ZANAFLEX) 4 MG tablet Take 4 mg by mouth at bedtime. 03/30/19   [provider]  TRADJENTA 5 MG TABS tablet Take 5 mg by mouth daily.  06/08/18   [provider]  traMADol (ULTRAM) 50 MG tablet Take 50 mg by mouth every 6 (six) hours as needed for moderate pain.     [provider]  vitamin C (ASCORBIC ACID) 500 MG tablet Take 500 mg by mouth daily.    [provider]  zinc gluconate 50 MG tablet Take 50 mg by mouth daily.  03/17/14   [provider]    Allergies    Patient has no known allergies.  Review of Systems   Review of Systems  Constitutional:  Negative for chills and fever.  HENT:   Negative for ear pain and sore throat.   Eyes:  Negative for pain and visual disturbance.  Respiratory:  Negative for cough and shortness of breath.   Cardiovascular:  Negative for chest pain and palpitations.  Gastrointestinal:  Negative for abdominal pain, diarrhea, nausea and vomiting.  Genitourinary:  Positive for flank pain. Negative for dysuria and hematuria.  Musculoskeletal:  Positive for back pain. Negative for arthralgias.  Skin:  Negative for color change and rash.  Neurological:  Negative for seizures, syncope, weakness and numbness.  All other systems reviewed and are negative.  Physical Exam Updated Vital Signs BP 137/66    Pulse (!) 46    Temp 97.8 F (36.6 C) (Oral)    Resp 18    Ht 1.753 m (5\' 9" )    Wt 109.8 kg    SpO2 99%    BMI 35.74 kg/m   Physical Exam Vitals and nursing note reviewed.  Constitutional:      General: He is not in acute distress.    Appearance: Normal appearance. He is well-developed.  HENT:     Head: Normocephalic and atraumatic.  Eyes:     Extraocular Movements: Extraocular movements intact.     Conjunctiva/sclera: Conjunctivae normal.     Pupils: Pupils are equal, round, and reactive to light.  Cardiovascular:     Rate and Rhythm: Normal rate and regular rhythm.     Heart sounds: No murmur heard. Pulmonary:     Effort: Pulmonary effort is normal. No respiratory distress.     Breath sounds: Normal breath sounds.  Abdominal:     General: There is no distension.     Palpations: Abdomen is soft.     Tenderness: There is no abdominal tenderness. There is no guarding.  Musculoskeletal:        General: Swelling present.     Cervical back: Normal range of motion and neck supple.  Skin:    General: Skin is warm and dry.     Capillary Refill: Capillary refill takes less than 2 seconds.  Neurological:     General: No focal deficit present.     Mental Status: He is alert and oriented to person, place, and time.     Cranial Nerves: No cranial  nerve deficit.     Sensory: No sensory deficit.     Motor: No weakness.  Psychiatric:        Mood and Affect: Mood normal.    ED Results / Procedures / Treatments   Labs (all labs ordered are listed, but only abnormal results are displayed) Labs Reviewed  URINALYSIS, ROUTINE W REFLEX MICROSCOPIC - Abnormal; Notable for the following components:      Result Value   Glucose, UA >=500 (*)    Hgb urine dipstick TRACE (*)    Protein, ur 100 (*)    All other components within normal limits  BASIC METABOLIC PANEL - Abnormal; Notable for the following components:   CO2 21 (*)    Glucose, Bld 147 (*)    BUN 34 (*)    Creatinine, Ser 2.18 (*)    GFR, Estimated 31 (*)    All other components within normal limits  CBC WITH DIFFERENTIAL/PLATELET - Abnormal; Notable for the following components:   RBC 3.40 (*)    Hemoglobin 11.3 (*)    HCT 35.5 (*)    MCV 104.4 (*)    All other components within normal limits  URINALYSIS, MICROSCOPIC (REFLEX)    EKG None  Radiology CT Renal Stone Study  Result Date: 01/25/2021 CLINICAL DATA:  Right flank pain.  Renal stone suspected. EXAM: CT ABDOMEN AND PELVIS WITHOUT CONTRAST TECHNIQUE: Multidetector CT imaging of the abdomen and pelvis was performed following the standard protocol without IV contrast. COMPARISON:  MRI December 24, 2020 FINDINGS: Lower chest: No acute abnormality. Three-vessel coronary artery calcifications. Hepatobiliary: No suspicious hepatic lesion on this noncontrast examination. Gallbladder surgically absent. Prominence of the biliary tree is favored reservoir effect post cholecystectomy. Pancreas: No  pancreatic ductal dilation or evidence of acute inflammation. Spleen: Within normal limits. Adrenals/Urinary Tract: Bilateral adrenal glands are unremarkable. Nonobstructive bilateral renal stones measuring up to 4 mm on the left. No obstructive ureteral or bladder calculi identified. Peripherally calcified 13 mm left renal cyst. 2.3 cm  left lower pole renal cyst. Urinary bladder is unremarkable for degree of distension. Stomach/Bowel: No enteric contrast was administered. Postsurgical change of prior gastric bypass. No pathologic dilation of small or large bowel. The appendix and terminal ileum appear normal. Colonic diverticulosis without findings of acute diverticulitis. Vascular/Lymphatic: Aortic and branch vessel atherosclerosis without abdominal aortic aneurysm. No pathologically enlarged abdominal or pelvic lymph nodes. Reproductive: Within normal limits, although evaluation is limited by streak artifact from bilateral hip hardware. Other: No significant abdominopelvic free fluid. Laxity of the anterior abdominal wall. Fat containing left inguinal hernia. Musculoskeletal: Right total hip arthroplasty. Fixation hardware in the left femoral head. Advanced multilevel degenerative changes spine. Degenerative changes the bilateral SI joints. No acute osseous abnormality. IMPRESSION: 1. Nonobstructive bilateral renal stones measuring up to 4 mm on the left. No obstructive ureteral or bladder calculi identified. 2. Colonic diverticulosis without findings of acute diverticulitis. 3.  Aortic Atherosclerosis (ICD10-I70.0). Electronically Signed   By: Dahlia Bailiff M.D.   On: 01/25/2021 10:19   DG Hip Unilat W or Wo Pelvis 2-3 Views Right  Result Date: 01/25/2021 CLINICAL DATA:  Status post hip replacement, right hip pain for 4 days, no injury EXAM: DG HIP (WITH OR WITHOUT PELVIS) 2-3V RIGHT COMPARISON:  None. FINDINGS: Status post right hip total arthroplasty. No evidence of perihardware fracture or component loosening. No displaced fracture or dislocation of the pelvis and proximal left femur seen in frontal view only. Screw fixation of the left femoral neck. IMPRESSION: 1. Status post right hip total arthroplasty. No evidence of perihardware fracture or component loosening. 2. No displaced fracture or dislocation of the pelvis and proximal  left femur seen in frontal view only. Electronically Signed   By: Delanna Ahmadi M.D.   On: 01/25/2021 10:47    Procedures Procedures   Medications Ordered in ED Medications  0.9 %  sodium chloride infusion ( Intravenous New Bag/Given 01/25/21 1000)  oxyCODONE-acetaminophen (PERCOCET/ROXICET) 5-325 MG per tablet 1 tablet (1 tablet Oral Given 01/25/21 0642)  ondansetron (ZOFRAN) injection 4 mg (4 mg Intravenous Given 01/25/21 1003)  HYDROmorphone (DILAUDID) injection 1 mg (1 mg Intravenous Given 01/25/21 1004)    ED Course  I have reviewed the triage vital signs and the nursing notes.  Pertinent labs & imaging results that were available during my care of the patient were reviewed by me and considered in my medical decision making (see chart for details).    MDM Rules/Calculators/A&P                           Symptoms clinically suggestive of musculoskeletal pain.  Patient's had a hip replacement.  So we will get x-rays of that right hip.  Patient also has had a history of kidney stones we will get CT renal.  Patient's urinalysis without acute findings.  Basic metabolic panel significant for blood glucose of 147 CO2 21.  BUN 34 creatinine 2.18 giving him a GFR of 31.  Which is little bit worse than his baseline.  No leukocytosis.  Hemoglobin good at 11.3.  Platelets good at 154.  CT scan abdomen shows no ureteral stones.  Shows a lot of degenerative changes in the low back.  Shows that the right hip and hardware appear normal.  Also I dedicated films to the right hip and pelvis without any acute abnormalities.  Patient followed by [facility]oped[facility].  Has had steroid injections in the past few weeks.  We will have him follow back up with that.   Patient received hydromorphone here with improvement in pain.  Patient has tramadol that he takes at home at all times.  We will go ahead and supplement with hydrocodone to help with the pain in the meantime.  Patient's renal function little  bit worse than baseline.  We will have him follow back up with primary care doctor to have that rechecked.   Final Clinical Impression(s) / ED Diagnoses Final diagnoses:  Acute right-sided low back pain without sciatica    Rx / DC Orders ED Discharge Orders     None        Fredia Sorrow, MD 01/25/21 1132

## 2021-01-29 ENCOUNTER — Other Ambulatory Visit: Payer: Self-pay | Admitting: Internal Medicine

## 2021-01-29 ENCOUNTER — Other Ambulatory Visit (HOSPITAL_COMMUNITY): Payer: Self-pay | Admitting: Internal Medicine

## 2021-01-29 DIAGNOSIS — M62830 Muscle spasm of back: Secondary | ICD-10-CM

## 2021-01-29 DIAGNOSIS — M545 Low back pain, unspecified: Secondary | ICD-10-CM

## 2021-01-30 ENCOUNTER — Ambulatory Visit (HOSPITAL_COMMUNITY)
Admission: RE | Admit: 2021-01-30 | Discharge: 2021-01-30 | Disposition: A | Discharge status: Home / Self Care | Payer: Medicare Other | Source: Ambulatory Visit | Attending: Internal Medicine | Admitting: Internal Medicine

## 2021-01-30 DIAGNOSIS — M62830 Muscle spasm of back: Secondary | ICD-10-CM | POA: Diagnosis present

## 2021-01-30 DIAGNOSIS — M545 Low back pain, unspecified: Secondary | ICD-10-CM | POA: Diagnosis present

## 2021-01-30 IMAGING — MR MR LUMBAR SPINE WO/W CM
5 of 8 series · 29 of 48 positions shown · IV contrast (10 GADAVIST)
Comparison: Prior MRI from [DATE].

CLINICAL DATA: Initial evaluation for right-sided lower back pain.

EXAM:
MRI LUMBAR SPINE WITHOUT AND WITH CONTRAST
TECHNIQUE: Multiplanar and multiecho pulse sequences of the lumbar spine were
obtained without and with intravenous contrast.
CONTRAST:  10mL GADAVIST GADOBUTROL 1 MMOL/ML IV SOLN

[Series 5: T1 · sagittal · 4.0mm · 0.81mm/px · 5 of 18 slices shown (1 of 2)]
[im 1/18]
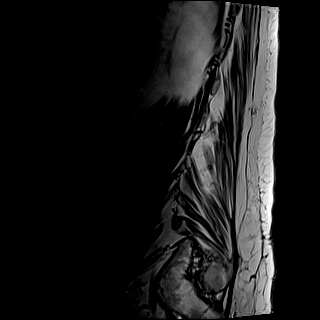
[im 5/18]
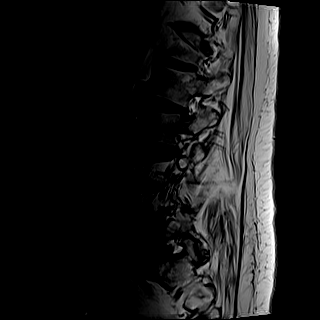
[im 9/18]
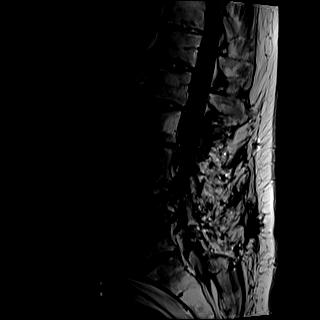
[im 13/18]
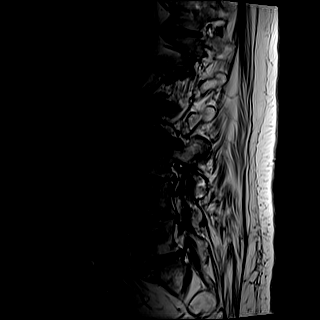
[im 18/18]
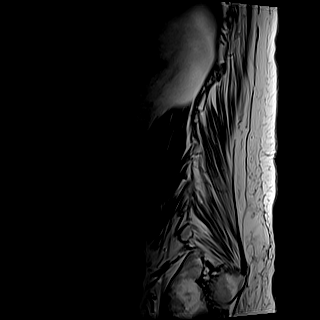

[Series 7: T2 · sagittal · 4.0mm · 0.81mm/px · 4 of 18 slices shown (1 of 2)]
[im 1/18]
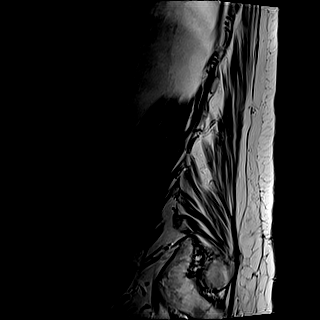
[im 6/18]
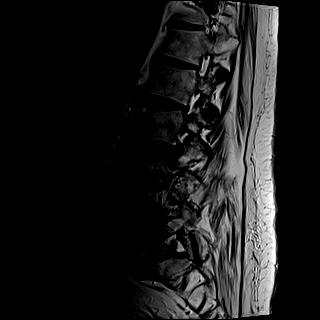
[im 12/18]
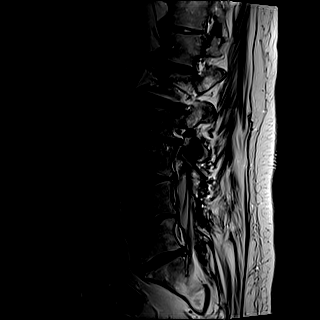
[im 18/18]
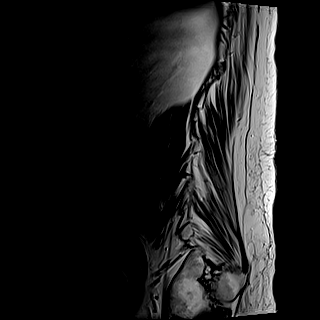

[Series 9: T2 · axial · 4.0mm · 0.62mm/px · z∈[-17,+179]mm · 9 of 39 slices shown (2 of 2)]
[im 1/39]
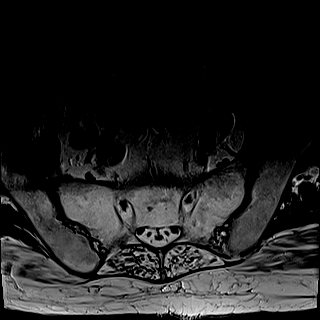
[im 5/39]
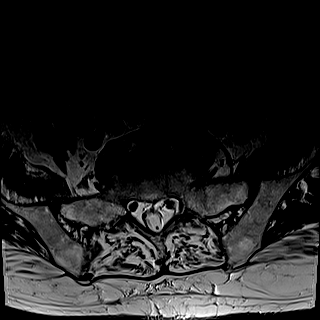
[im 10/39]
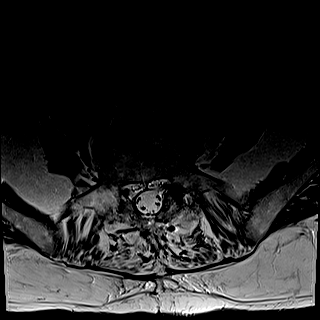
[im 15/39]
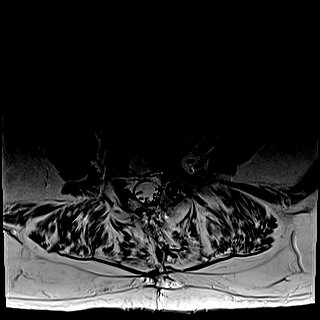
[im 20/39]
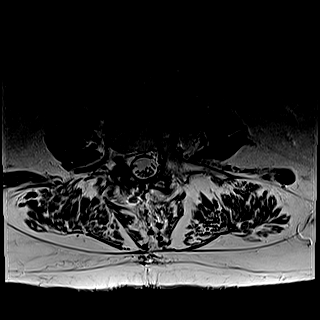
[im 24/39]
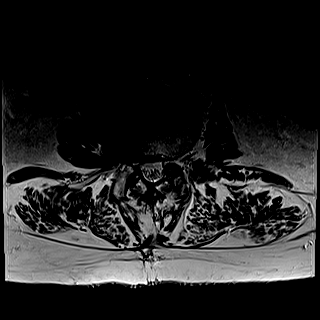
[im 29/39]
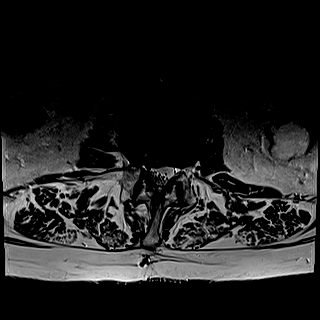
[im 34/39]
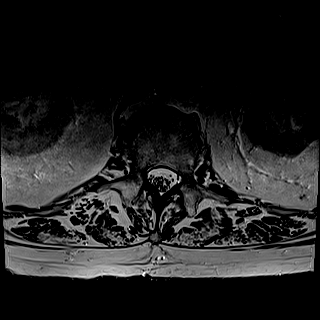
[im 39/39]
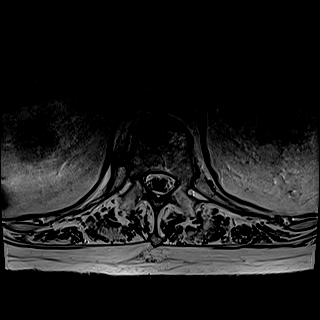

[Series 10: T1 · axial · 4.0mm · 0.39mm/px · z∈[-17,+154]mm · 7 of 39 slices shown (2 of 2)]
[im 1/39]
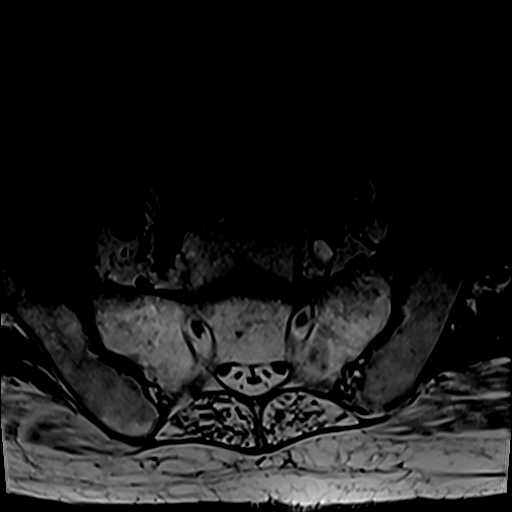
[im 5/39]
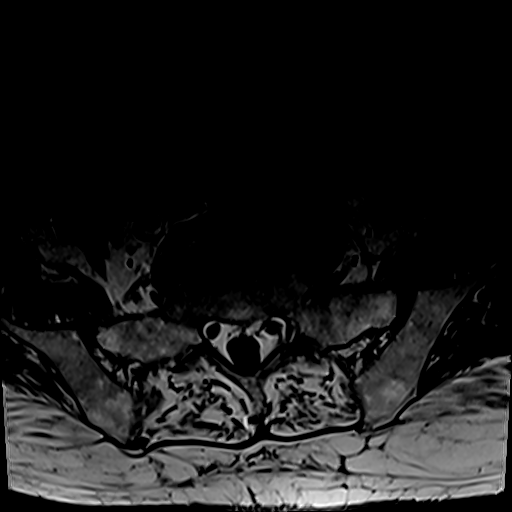
[im 10/39]
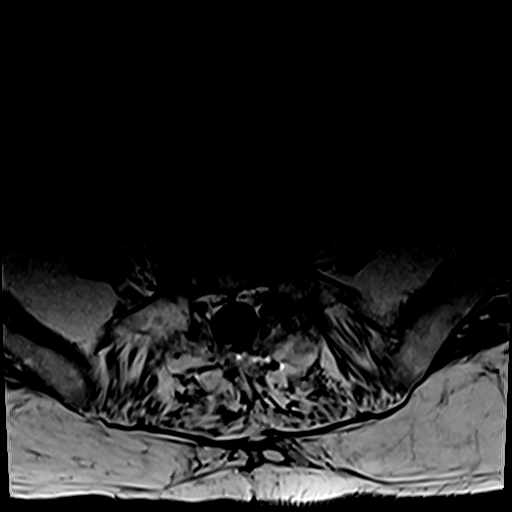
[im 15/39]
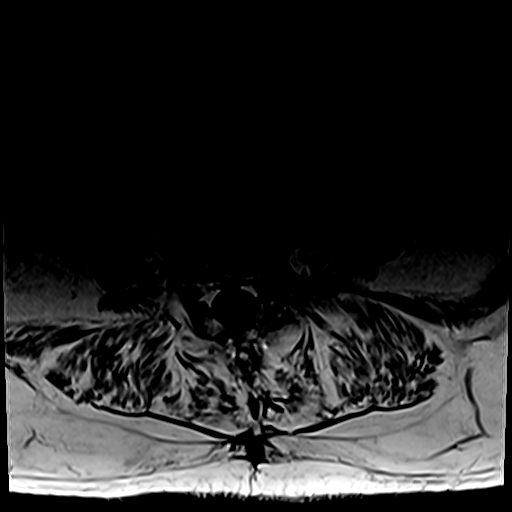
[im 24/39]
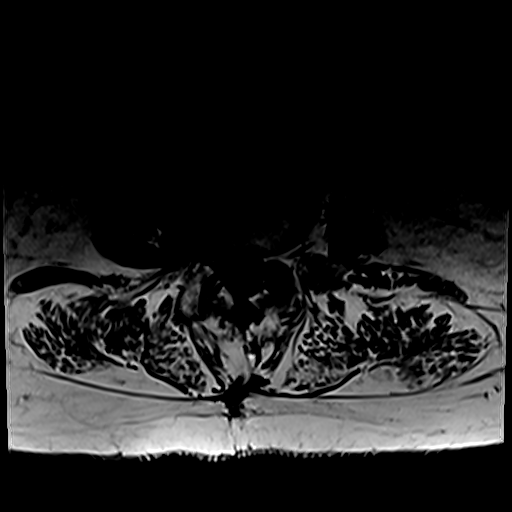
[im 29/39]
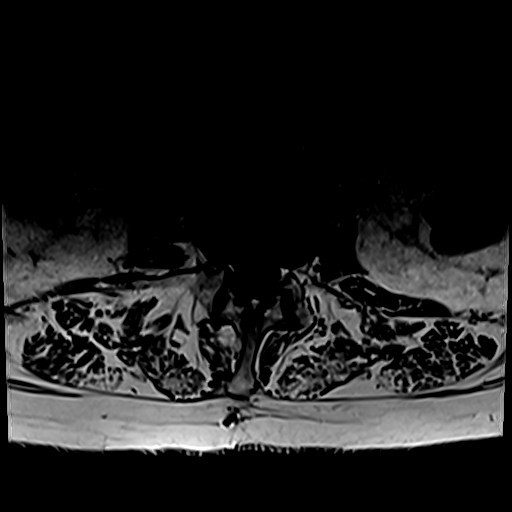
[im 34/39]
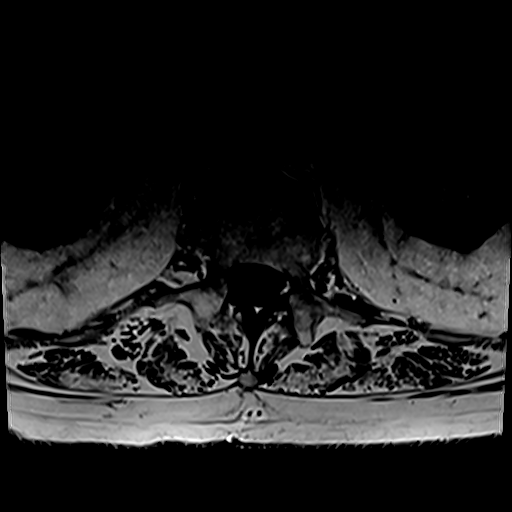

[Series 11: T2 post-contrast · sagittal · 4.0mm · 0.81mm/px · 4 of 18 slices shown]
[im 1/18]
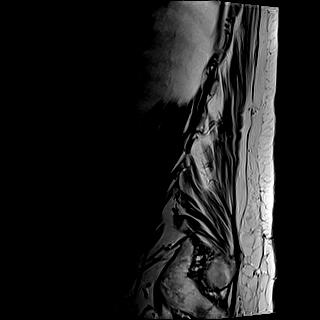
[im 6/18]
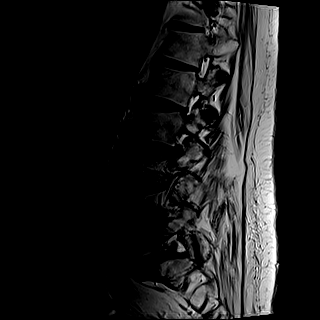
[im 12/18]
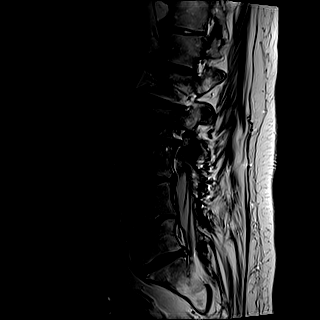
[im 18/18]
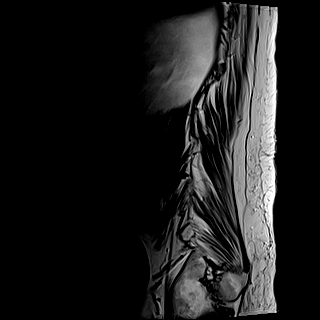

[29 of 48 positions shown; findings below may reference images not displayed]

FINDINGS: Segmentation: Standard. Lowest well-formed disc space labeled the
L5-S1 level.

Alignment: Sigmoid scoliotic curvature of the visualized
thoracolumbar spine, stable. No interval listhesis or malalignment.

Vertebrae: Vertebral body height maintained without acute or
interval fracture. Bone marrow signal intensity heterogeneous but
overall within normal limits. Benign hemangioma noted within the L2
vertebral body. No worrisome osseous lesions. Discogenic reactive
endplate change with associated marrow edema and enhancement present
about the L2-3 interspace, progressed from prior. No other abnormal
marrow edema or enhancement.

Conus medullaris and cauda equina: Conus extends to the L1 level.
Conus and cauda equina appear normal.

Paraspinal and other soft tissues: Chronic postoperative changes
noted within the posterior paraspinous soft tissues. Paraspinous
soft tissues demonstrate no acute finding. Few scattered benign
appearing renal cysts noted bilaterally.

Disc levels:

T12-L1: Degenerative intervertebral disc space narrowing with disc
desiccation and mild disc bulge. Right-sided reactive endplate
spurring. Mild facet hypertrophy. No spinal stenosis. Foramina
remain patent.

L1-2: Mild intervertebral disc space narrowing with diffuse disc
bulge and disc desiccation. Associated reactive endplate spurring.
Superimposed right subarticular disc protrusion encroaches upon the
right lateral recess (series 9, image 11). This appears mildly
increased in size from prior. Mild facet hypertrophy. Resultant mild
right lateral recess stenosis. Central canal remains patent. No
significant foraminal stenosis.

L2-3: Degenerative intervertebral disc space narrowing with diffuse
disc bulge and disc desiccation. Associated reactive endplate
spurring. Moderate bilateral facet hypertrophy. Prior posterior
decompression. Residual mild narrowing of the lateral recesses
bilaterally, slightly worse on the left. Central canal remains
patent. No progressive foraminal stenosis. Appearance is stable.

L3-4: Degenerative intervertebral disc space narrowing with disc
desiccation and mild disc bulge. Associated reactive endplate
spurring, greater on the left. Mild to moderate facet hypertrophy.
Prior posterior decompression. No residual canal or lateral recess
stenosis. Mild right with moderate left L3 foraminal stenosis.
Appearance is stable.

L4-5: Advanced degenerative intervertebral disc space narrowing with
partial ankylosis across the L4-5 interspace. Associated reactive
endplate spurring, greater on the left. Mild to moderate facet
hypertrophy. Prior posterior decompression. No residual canal or
foraminal stenosis. Foramina remain patent. Appearance is stable.

L5-S1: Degenerative intervertebral disc space narrowing with disc
desiccation and mild diffuse disc bulge. Associated reactive
endplate spurring. Superimposed shallow right subarticular disc
protrusion with annular fissure (series 9, image 33). Protruding
disc contacts the descending right S1 nerve root as it courses
through the right lateral recess. No frank impingement or
displacement. Mild to moderate bilateral facet hypertrophy. Prior
posterior decompression. No significant canal or lateral recess
stenosis. Foramina remain patent.
IMPRESSION: 1. Small right subarticular disc protrusion at L1-2, potentially
affecting the descending right L2 nerve root. This appears slightly
increased in size as compared to previous MRI.
2. Shallow right subarticular disc protrusion at L5-S1, contacting
and potentially irritating the descending right S1 nerve root. This
is similar to previous.
3. Moderate left with mild right L3 foraminal stenosis related to
disc bulge, reactive endplate change, and facet degeneration,
stable.
4. Stable postoperative changes from prior posterior decompression
at L2-3 through L5-S1.

## 2021-01-30 MED ORDER — GADOBUTROL 1 MMOL/ML IV SOLN
10.0000 mL | Freq: Once | INTRAVENOUS | Status: AC | PRN
  Administered 2021-01-30: 22:00:00 10 mL via INTRAVENOUS

## 2021-02-01 ENCOUNTER — Other Ambulatory Visit (HOSPITAL_COMMUNITY): Payer: Medicare Other

## 2021-02-06 ENCOUNTER — Other Ambulatory Visit: Payer: Self-pay | Admitting: Cardiology

## 2021-02-06 NOTE — Telephone Encounter (Signed)
Prescription refill request for Eliquis received. Indication: AF Last office visit: 10/21 Scr: 2.18 Age: 75 Weight: 115 kg

## 2021-02-15 ENCOUNTER — Telehealth: Payer: Self-pay

## 2021-02-15 NOTE — Telephone Encounter (Signed)
° °  Patient Name: Carlos Moreno  DOB: 10/27/1945 MRN: [id number]  Primary Cardiologist: Minus Breeding, MD  Chart reviewed as part of pre-operative protocol coverage. The patient is due for 1 year follow-up in order to have clearance reviewed. He has an upcoming appointment scheduled 02/26/21 with Dr. Percival Spanish at which time this clearance can be addressed in case there are any issues that would impact surgical clearance. I added preop FYI to appointment notes so that provider is aware to address at time of OV.   Per office protocol, the cardiology provider should forward their finalized clearance decision to requesting party below. Will route to pharm re: Eliquis so that this is available to MD at time of OV. I will route this message as FYI to requesting party and remove this message from the pre-op box as separate preop APP input not needed at this time.  Please call with questions.  Charlie Pitter, PA-C 02/15/2021, 8:04 PM

## 2021-02-15 NOTE — Telephone Encounter (Signed)
° °  Pre-operative Risk Assessment    Patient Name: Carlos Moreno  DOB: 10/13/1945 MRN: [id number]      Request for Surgical Clearance    Procedure:   Right  L 2-3  TF ESI  Date of Surgery:  Clearance TBD                                 Surgeon:  Dr.[place] Ron Agee Surgeon's Group or Practice Name:  [facility] Phone number:  [phone] Fax number:  [phone]   Type of Clearance Requested:   - Medical  - Pharmacy:  Hold Apixaban (Eliquis)     Type of Anesthesia:  Not Indicated   Additional requests/questions:  Please advise surgeon/provider what medications should be held. Please fax a copy of clearance to the surgeon's office.  Evalee Mutton   02/15/2021, 5:50 PM

## 2021-02-16 NOTE — Telephone Encounter (Signed)
Patient with diagnosis of aflutter on Eliquis for anticoagulation.    Procedure: Right L 2-3 TF ESI Date of procedure: TBD  CHA2DS2-VASc Score = 5  This indicates a 7.2% annual risk of stroke. The patient's score is based upon: CHF History: 0 HTN History: 1 Diabetes History: 1 Stroke History: 0 Vascular Disease History: 1 Age Score: 2 Gender Score: 0  CrCl 34mL/min using adjusted body weight Platelet count 154K  Per office protocol, patient can hold Eliquis for 3 days prior to procedure assuming no major new clinical change are noted at upcoming cardiology visit.

## 2021-02-19 ENCOUNTER — Telehealth: Payer: Self-pay

## 2021-02-19 NOTE — Telephone Encounter (Signed)
Dr. Ron Agee called this morning to let us know that pt is having a spinal injection today after going to the ED in severe pain. Pt in the past has been told to hold his plavix for 3 days and he has done this for this injection. Pt to be seen by Dr. Percival Spanish on 1/16. Pt is still planning to come to that appointment. Will route to Dr. Percival Spanish as an [place].

## 2021-02-24 NOTE — Progress Notes (Signed)
Cardiology Office Note   Date:  02/26/2021   ID:  Carlos Moreno, DOB 1945/04/27, MRN [id number]  PCP:  Crist Infante, MD  Cardiologist:   Minus Breeding, MD  Chief Complaint  Patient presents with   Coronary Artery Disease      History of Present Illness: Carlos Moreno is a 76 y.o. male who presents for follow up after hospitalization for atrial flutter in May 2021.  He was anticoagulated and had TEE cardioversion.  He did have some slow rates.  He is taken off of aspirin because of previous history of GI bleed.  He had distant stenting in 2005.  He also had more recent stenting in 2007.  His prior cardiac treatment was in [place].  Of note his last catheterization was 2017.  Since I last saw him he had spinal injection.  He has had no new cardiovascular problems.  He is very limited by his back pain.  He has lost about 10 pounds.  The patient denies any new symptoms such as chest discomfort, neck or arm discomfort. There has been no new shortness of breath, PND or orthopnea. There have been no reported palpitations, presyncope or syncope.   Past Medical History:  Diagnosis Date   Anemia    Atrial flutter with rapid ventricular response ([place]) 07/27/2019   Basal cell carcinoma    CAD (coronary artery disease) 2005    PCI to mid LAD with a 3.0 x 15 mm Resolute Integrity DES May 2017. LAD Stent 2005 (Details not available)   Cataract    CKD stage 3 due to type 2 diabetes mellitus ([place]) 07/27/2019   Curvature of spine    Diabetes mellitus type 2, diet-controlled ([place]) 07/27/2019   Gallstones    Hypertension    Kidney stones    Macular degeneration    Obesity    OSA (obstructive sleep apnea)    CPAP    Past Surgical History:  Procedure Laterality Date   ABDOMINOPLASTY  02/2009   CARDIOVERSION N/A 07/29/2019   Procedure: CARDIOVERSION;  Surgeon: Donato Heinz, MD;  Location: [facility];  Service: Cardiovascular;  Laterality: N/A;   CATARACT EXTRACTION,  BILATERAL  03/2007   CHOLECYSTECTOMY  2001   COLONOSCOPY  08/07 11/12 04/16    CORONARY ANGIOPLASTY WITH STENT PLACEMENT  07/2003   Stent LAD, 06/2015 DES LAD   HIP SURGERY Left    INGUINAL HERNIA REPAIR Left 1956   KNEE ARTHROSCOPY Right 05/2005   x3   LAMINECTOMY  2009   Lumbar   REPLACEMENT TOTAL KNEE Left 1994   REPLACEMENT TOTAL KNEE Right    x 3   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  3   b complex vitamins capsule Take 1 capsule by mouth every other day.      Calcium-Phosphorus-Vitamin D (CALCIUM GUMMIES PO) Take 2 tablets by mouth daily. Takes two daily      cetirizine (ZYRTEC) 10 MG tablet Take 10 mg by mouth daily.     Cyanocobalamin (B-12) 5000 MCG SUBL Place 1 tablet under the tongue every other day.      famotidine (PEPCID) 20 MG tablet TAKE ONE  TABLET BY MOUTH TWICE A DAY 180 tablet 3   HYDROcodone-acetaminophen (NORCO/VICODIN) 5-325 MG tablet Take 1 tablet by mouth every 6 (six) hours as needed for moderate pain. 20 tablet 0   isosorbide mononitrate (IMDUR) 30 MG 24 hr tablet Take 30 mg by mouth daily.  3   JARDIANCE 25 MG TABS tablet Take 1 tablet (25 mg total) by mouth daily. Do not restart until you follow up with your PCP 30 tablet    losartan (COZAAR) 25 MG tablet Take 25 mg by mouth daily.     Melatonin ER 5 MG TBCR Take 5 mg by mouth every evening.      Multiple Vitamins-Minerals (VISION FORMULA/LUTEIN) TABS Take 1 tablet by mouth daily.      nebivolol (BYSTOLIC) 10 MG tablet Take 10 mg by mouth daily.     Polysaccharide Iron Complex (IRON UP PO) Take 120 mg by mouth daily.     tiZANidine  (ZANAFLEX) 4 MG tablet Take 4 mg by mouth at bedtime.     TRADJENTA 5 MG TABS tablet Take 5 mg by mouth daily.      traMADol (ULTRAM) 50 MG tablet Take 50 mg by mouth every 6 (six) hours as needed for moderate pain.      vitamin C (ASCORBIC ACID) 500 MG tablet Take 500 mg by mouth daily.     zinc gluconate 50 MG tablet Take 50 mg by mouth daily.      apixaban (ELIQUIS) 5 MG TABS tablet Take 1 tablet (5 mg total) by mouth 2 (two) times daily. 180 tablet 1   nitroGLYCERIN (NITROSTAT) 0.4 MG SL tablet Place 1 tablet (0.4 mg total) under the tongue every 5 (five) minutes as needed for chest pain. 25 tablet 3   No current facility-administered medications for this visit.    Allergies:   Patient has no known allergies.    ROS:  Please see the history of present illness.   Otherwise, review of systems are positive for none.   All other systems are reviewed and negative.    PHYSICAL EXAM: VS:  BP (!) 158/66    Pulse (!) 49    Ht 5\' 9"  (1.753 m)    Wt 237 lb (107.5 kg)    SpO2 97%    BMI 35.00 kg/m  , BMI Body mass index is 35 kg/m. GENERAL:  Well appearing NECK:  No jugular venous distention, waveform within normal limits, carotid upstroke brisk and symmetric, no bruits, no thyromegaly LUNGS:  Clear to auscultation bilaterally CHEST:  Unremarkable HEART:  PMI not displaced or sustained,S1 and S2 within normal limits, no S3, no S4, no clicks, no rubs, no murmurs ABD:  Flat, positive bowel sounds normal in frequency in pitch, no bruits, no rebound, no guarding, no midline pulsatile mass, no hepatomegaly, no splenomegaly EXT:  2 plus pulses throughout, no edema, no cyanosis no clubbing   EKG:  EKG is  ordered today. Sinus bradycardia, rate 49, axis within normal limits, intervals within normal limits, no acute ST-T wave changes.   Recent Labs: 01/25/2021: BUN 34; Creatinine, Ser 2.18; Hemoglobin 11.3; Platelets 154; Potassium 4.7; Sodium 138    Lipid Panel No results found for: CHOL,  TRIG, HDL, CHOLHDL, VLDL, LDLCALC, LDLDIRECT    Wt Readings from Last 3 Encounters:  02/26/21 237 lb (107.5 kg)  01/25/21 242 lb (109.8 kg)  11/15/19 253 lb 9.6 oz (115 kg)      Other studies Reviewed: Additional studies/ records that were reviewed today  include: Labs. Review of the above records demonstrates:  Please see elsewhere in the note.     ASSESSMENT AND PLAN:  ATRIAL FLUTTER:     He tolerates anticoagulation.  Has had no symptomatic paroxysms and none reported on his Watch.  No change in therapy.   CAD:    He has had no chest discomfort.  No change in therapy.  HTN:  His BP was elevated today but his blood pressures at home are in the 130s over 60s.  No change in therapy.   SLEEP APNEA: He wears CPAP.  No change in therapy.  DYSLIPIDEMIA:   LDL was 37.  Continue on the meds as listed.  28.  No change in therapy.     Current medicines are reviewed at length with the patient today.  The patient does not have concerns regarding medicines.  The following changes have been made: None  Labs/ tests ordered today include: None  No orders of the defined types were placed in this encounter.    Disposition:   FU with me in 12 months.     Signed, Minus Breeding, MD  02/26/2021 12:09 PM    [facility]

## 2021-02-26 ENCOUNTER — Encounter: Payer: Self-pay | Admitting: Cardiology

## 2021-02-26 ENCOUNTER — Other Ambulatory Visit: Payer: Self-pay

## 2021-02-26 ENCOUNTER — Ambulatory Visit: Payer: Medicare Other | Admitting: Cardiology

## 2021-02-26 VITALS — BP 158/66 | HR 49 | Ht 69.0 in | Wt 237.0 lb

## 2021-02-26 DIAGNOSIS — I4892 Unspecified atrial flutter: Secondary | ICD-10-CM

## 2021-02-26 DIAGNOSIS — I2510 Atherosclerotic heart disease of native coronary artery without angina pectoris: Secondary | ICD-10-CM | POA: Diagnosis not present

## 2021-02-26 DIAGNOSIS — E785 Hyperlipidemia, unspecified: Secondary | ICD-10-CM | POA: Diagnosis not present

## 2021-02-26 DIAGNOSIS — G4733 Obstructive sleep apnea (adult) (pediatric): Secondary | ICD-10-CM | POA: Diagnosis not present

## 2021-02-26 DIAGNOSIS — I10 Essential (primary) hypertension: Secondary | ICD-10-CM

## 2021-02-26 MED ORDER — NITROGLYCERIN 0.4 MG SL SUBL: 0.4000 mg | SUBLINGUAL_TABLET | SUBLINGUAL | 3 refills | Status: AC | PRN

## 2021-02-26 MED ORDER — APIXABAN 5 MG PO TABS: 5.0000 mg | ORAL_TABLET | Freq: Two times a day (BID) | ORAL | 1 refills | Status: AC

## 2021-02-26 NOTE — Patient Instructions (Signed)
Medication Instructions:  °Your Physician recommend you continue on your current medication as directed.   ° °*If you need a refill on your cardiac medications before your next appointment, please call your pharmacy* ° °Follow-Up: °At [facility], you and your health needs are our priority.  As part of our continuing mission to provide you with exceptional heart care, we have created designated Provider Care Teams.  These Care Teams include your primary Cardiologist (physician) and Advanced Practice Providers (APPs -  Physician Assistants and Nurse Practitioners) who all work together to provide you with the care you need, when you need it. ° °We recommend signing up for the patient portal called "MyChart".  Sign up information is provided on this After Visit Summary.  MyChart is used to connect with patients for Virtual Visits (Telemedicine).  Patients are able to view lab/test results, encounter notes, upcoming appointments, etc.  Non-urgent messages can be sent to your provider as well.   °To learn more about what you can do with MyChart, go to [email or web address].   ° °Your next appointment:   °12 month(s) ° °The format for your next appointment:   °In Person ° °Provider:   °James Hochrein, MD  ° ° ° °

## 2021-03-08 ENCOUNTER — Other Ambulatory Visit: Payer: Self-pay | Admitting: Gastroenterology

## 2021-03-08 ENCOUNTER — Encounter: Payer: Self-pay | Admitting: Cardiology

## 2021-03-09 NOTE — Addendum Note (Signed)
Addended by: Wonda Horner on: 03/09/2021 03:47 PM   Modules accepted: Orders

## 2021-03-12 ENCOUNTER — Other Ambulatory Visit: Payer: Self-pay | Admitting: Internal Medicine

## 2021-03-12 DIAGNOSIS — R17 Unspecified jaundice: Secondary | ICD-10-CM

## 2021-03-13 ENCOUNTER — Other Ambulatory Visit: Payer: Self-pay | Admitting: Gastroenterology

## 2021-04-18 ENCOUNTER — Other Ambulatory Visit: Payer: Self-pay

## 2021-04-18 ENCOUNTER — Ambulatory Visit
Admission: RE | Admit: 2021-04-18 | Discharge: 2021-04-18 | Disposition: A | Discharge status: Home / Self Care | Payer: Medicare Other | Source: Ambulatory Visit | Attending: Internal Medicine | Admitting: Internal Medicine

## 2021-04-18 DIAGNOSIS — R17 Unspecified jaundice: Secondary | ICD-10-CM

## 2021-04-18 IMAGING — US US ABDOMEN LIMITED
1 series · 14 of 24 positions shown · non-contrast
Comparison: CT [DATE]

CLINICAL DATA: Elevated bilirubin

EXAM:
ULTRASOUND ABDOMEN LIMITED RIGHT UPPER QUADRANT

[Series 1: us abdomen limited · 0.24mm/px · 14 of 24 slices shown]
[im 1/24]
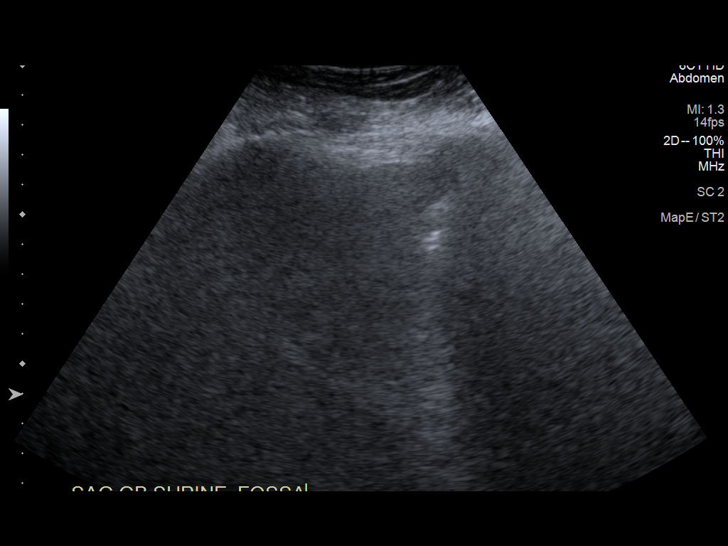
[im 3/24]
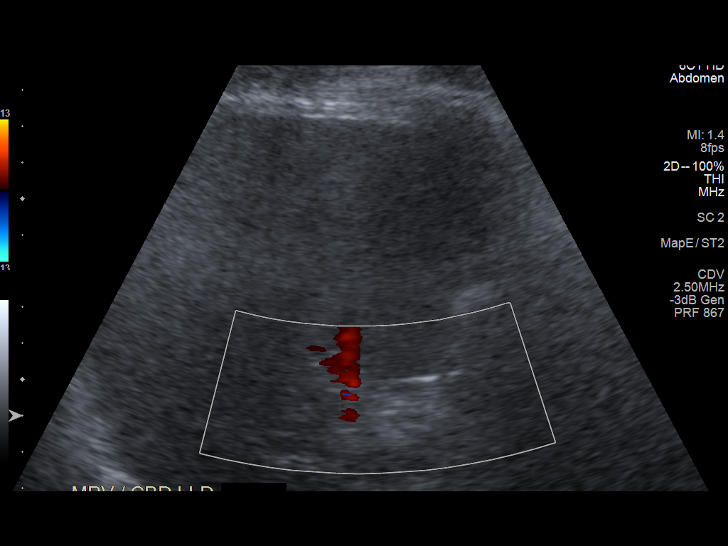
[im 5/24]
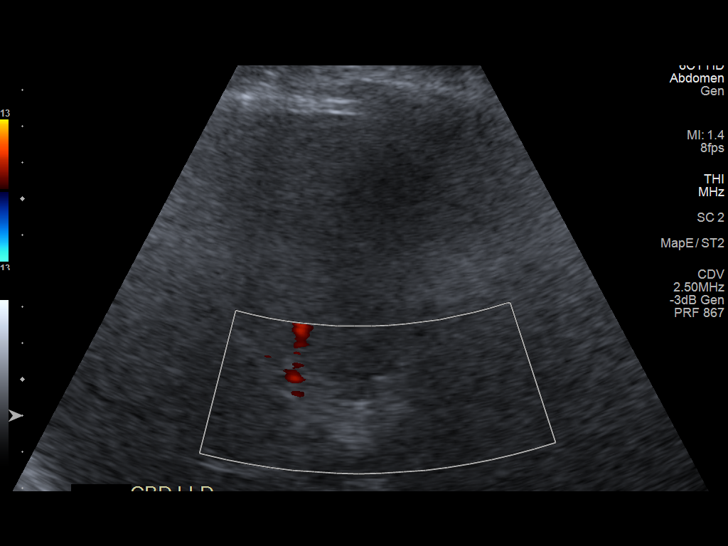
[im 7/24]
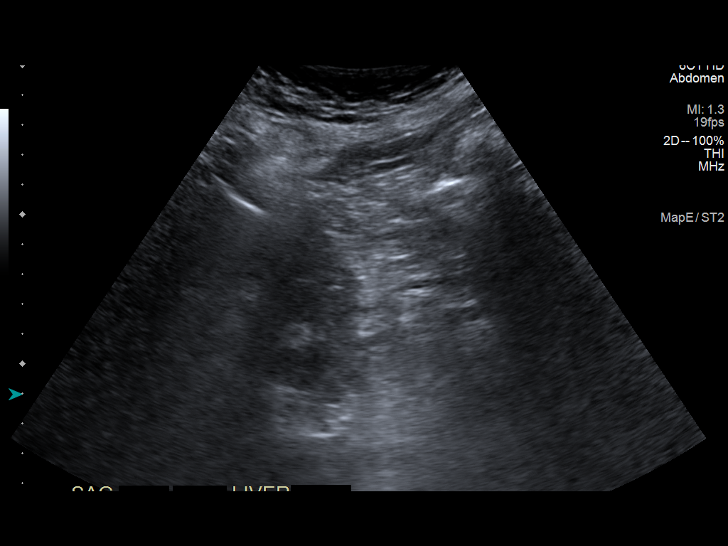
[im 8/24]
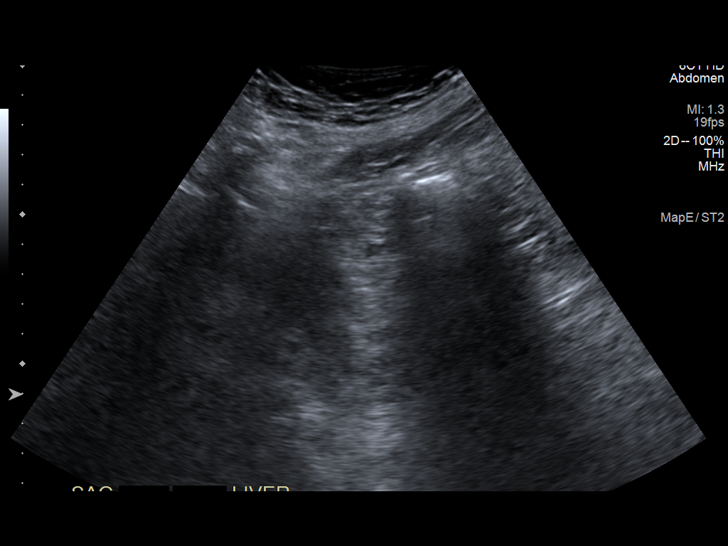
[im 10/24]
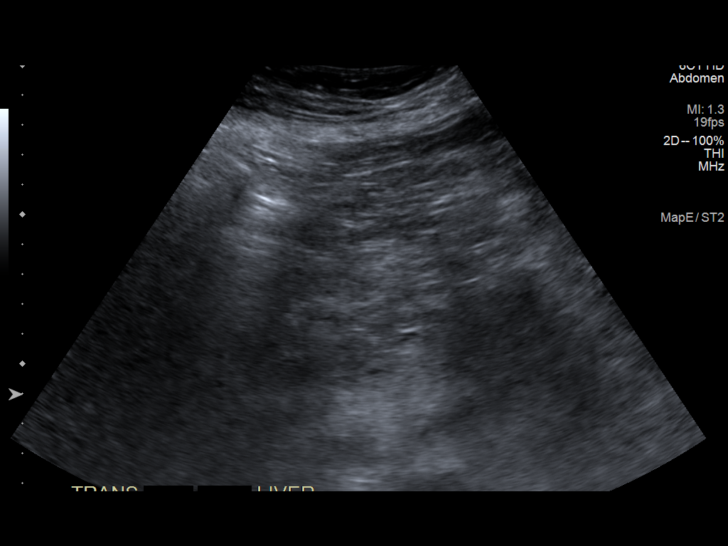
[im 12/24]
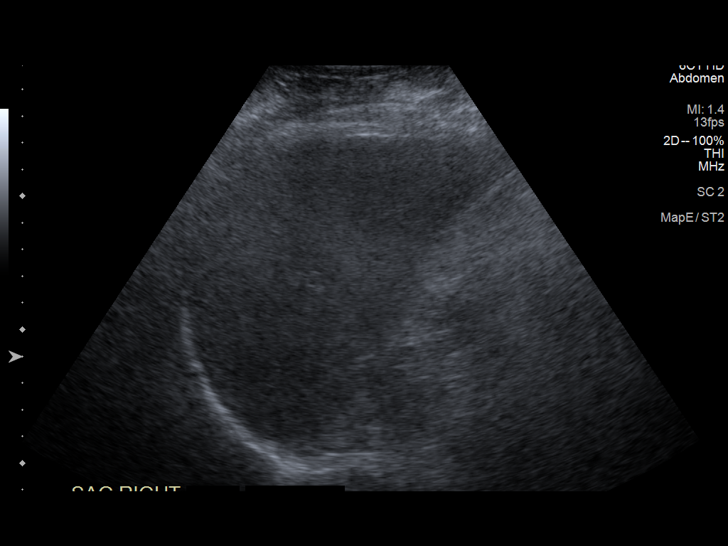
[im 13/24]
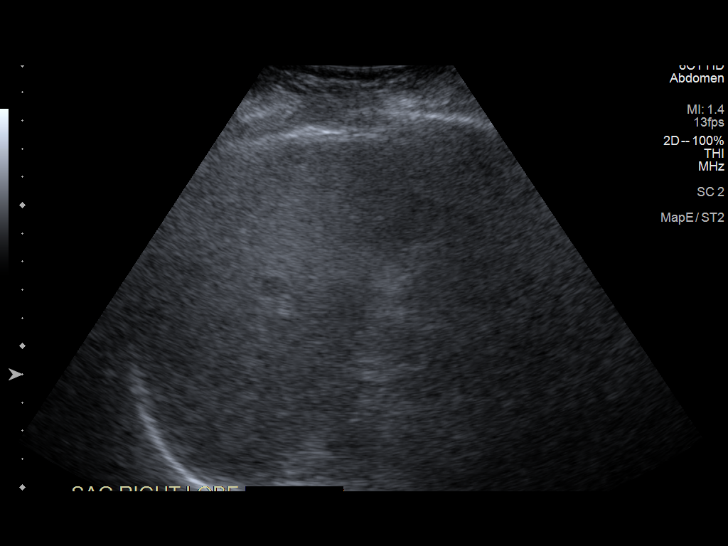
[im 15/24]
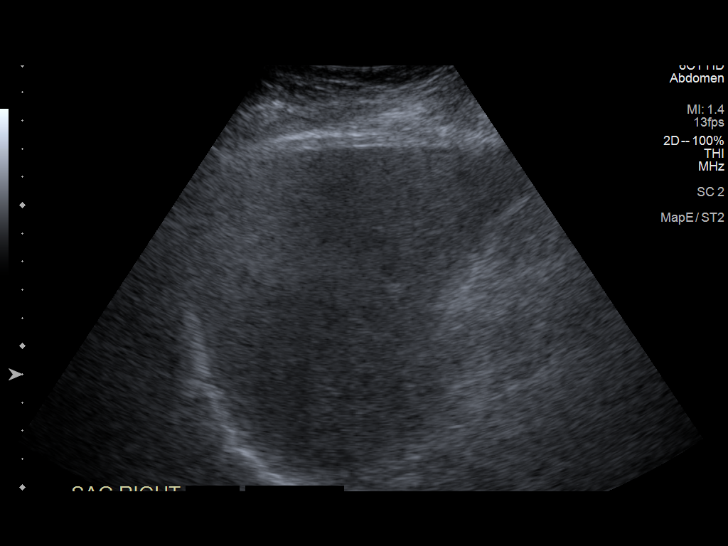
[im 17/24]
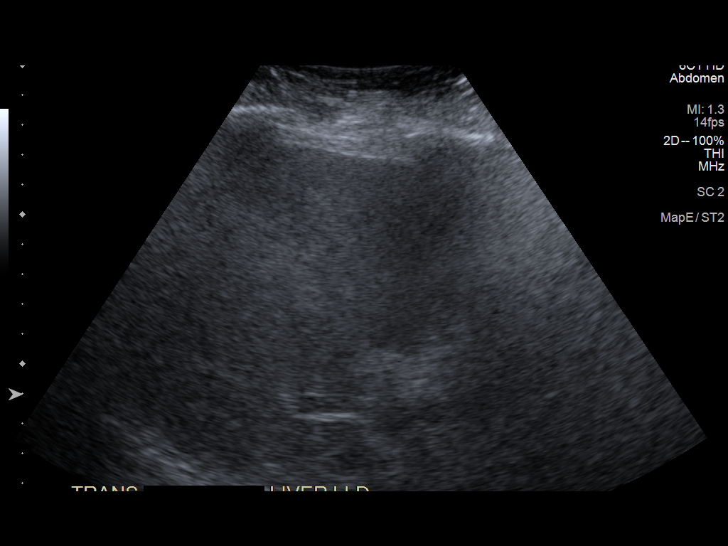
[im 19/24]
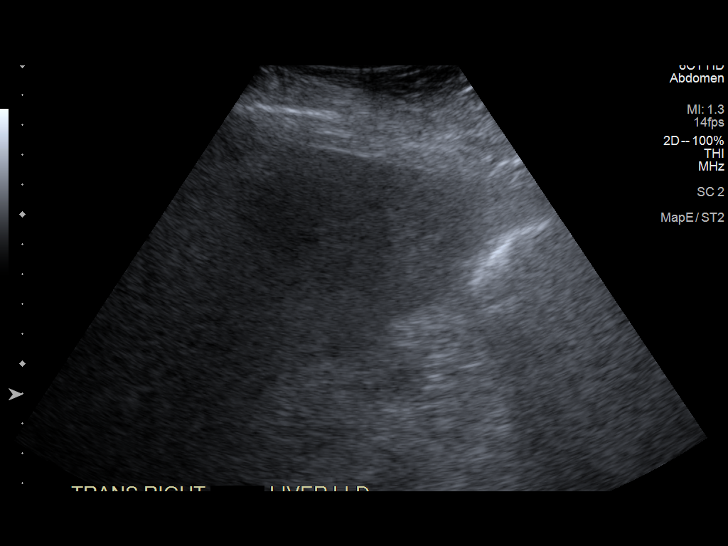
[im 20/24]
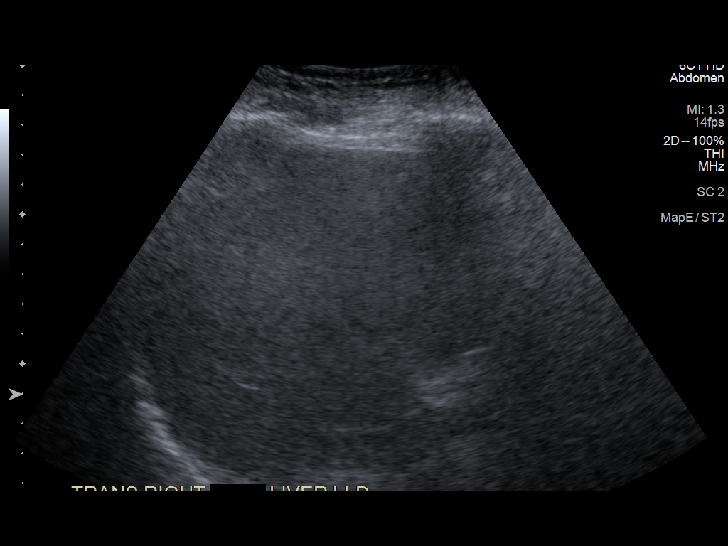
[im 22/24]
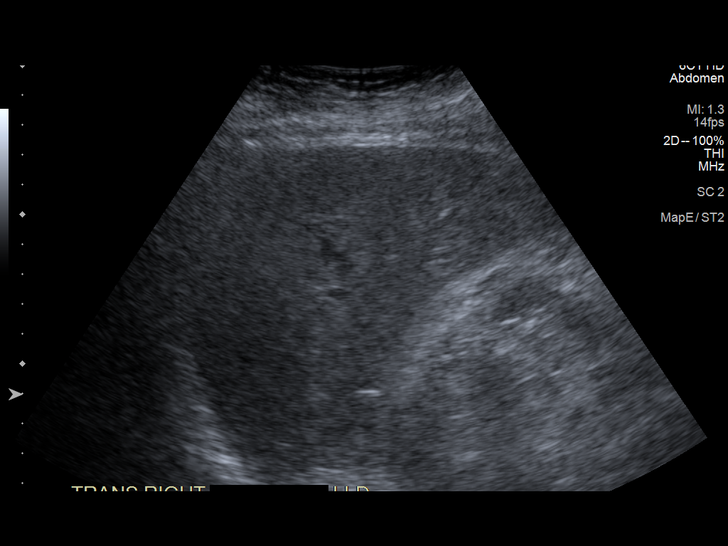
[im 24/24]
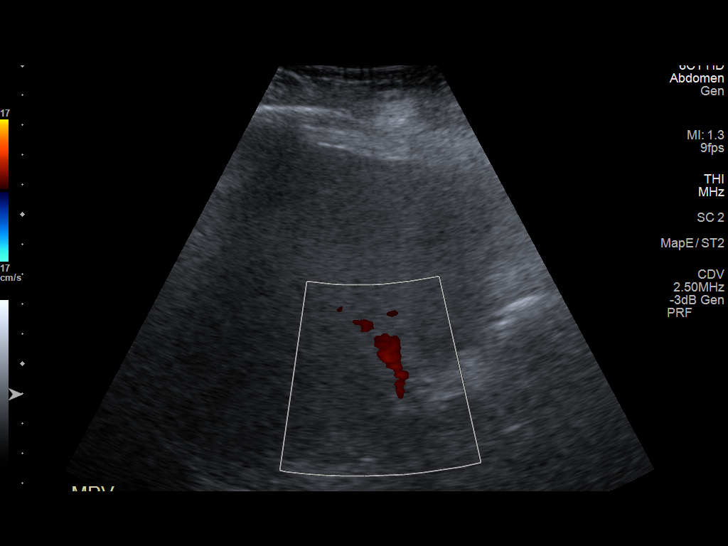

[14 of 24 positions shown; findings below may reference images not displayed]

FINDINGS: Gallbladder:

Surgically absent

Common bile duct:

Diameter: 3.9 mm

Liver:

Limited evaluation due to habitus and bowel gas. Diffusely
echogenic. No focal hepatic abnormality. Portal vein is patent on
color Doppler imaging with normal direction of blood flow towards
the liver.

Other: None.
IMPRESSION: 1. Status post cholecystectomy.  No biliary dilatation
2. Coarse echogenic liver consistent with hepatic steatosis and or
hepatocellular disease.

## 2021-12-26 ENCOUNTER — Telehealth: Payer: Self-pay

## 2021-12-26 NOTE — Telephone Encounter (Signed)
..     Pre-operative Risk Assessment    Patient Name: Carlos Moreno  DOB: 04-Jan-1946 MRN: [id number]      Request for Surgical Clearance    Procedure:   lumbar fusion  Date of Surgery:  Clearance TBD                                 Surgeon:  dr Duffy Rhody Surgeon's Group or Practice Name:  [facility] Phone number:  [phone] Fax number:  [phone]   Type of Clearance Requested:   - Medical  - Pharmacy:  Hold Apixaban (Eliquis)     Type of Anesthesia:  General    Additional requests/questions:    Gwenlyn Found   12/26/2021, 2:00 PM

## 2021-12-27 ENCOUNTER — Telehealth: Payer: Self-pay | Admitting: *Deleted

## 2021-12-27 NOTE — Telephone Encounter (Signed)
Patient with diagnosis of A flutter on Eliquis for anticoagulation.    Procedure: lumbar fusion Date of procedure: TBD   CHA2DS2-VASc Score = 5  This indicates a 7.2% annual risk of stroke. The patient's score is based upon: CHF History: 0 HTN History: 1 Diabetes History: 1 Stroke History: 0 Vascular Disease History: 1 Age Score: 2 Gender Score: 0    CrCl 50 mL/min Platelet count 154K  Per office protocol, patient can hold Eliquis for 3 days prior to procedure.    **This guidance is not considered finalized until pre-operative APP has relayed final recommendations.**

## 2021-12-27 NOTE — Telephone Encounter (Signed)
Pt has been scheduled for a tele-visit, 01/02/22 9:40.  Consent on file / medications reconciled.

## 2021-12-27 NOTE — Telephone Encounter (Signed)
Pt has been scheduled for a tele-visit, 01/02/22 9:40.  Consent on file / medications reconciled.     Patient Consent for Virtual Visit        Carlos Moreno has provided verbal consent on 12/27/2021 for a virtual visit (video or telephone).   CONSENT FOR VIRTUAL VISIT FOR:  Carlos Moreno  By participating in this virtual visit I agree to the following:  I hereby voluntarily request, consent and authorize [facility] and its employed or contracted physicians, physician assistants, nurse practitioners or other licensed health care professionals (the Practitioner), to provide me with telemedicine health care services (the "Services") as deemed necessary by the treating Practitioner. I acknowledge and consent to receive the Services by the Practitioner via telemedicine. I understand that the telemedicine visit will involve communicating with the Practitioner through live audiovisual communication technology and the disclosure of certain medical information by electronic transmission. I acknowledge that I have been given the opportunity to request an in-person assessment or other available alternative prior to the telemedicine visit and am voluntarily participating in the telemedicine visit.  I understand that I have the right to withhold or withdraw my consent to the use of telemedicine in the course of my care at any time, without affecting my right to future care or treatment, and that the Practitioner or I may terminate the telemedicine visit at any time. I understand that I have the right to inspect all information obtained and/or recorded in the course of the telemedicine visit and may receive copies of available information for a reasonable fee.  I understand that some of the potential risks of receiving the Services via telemedicine include:  Delay or interruption in medical evaluation due to technological equipment failure or disruption; Information transmitted may not be  sufficient (e.g. poor resolution of images) to allow for appropriate medical decision making by the Practitioner; and/or  In rare instances, security protocols could fail, causing a breach of personal health information.  Furthermore, I acknowledge that it is my responsibility to provide information about my medical history, conditions and care that is complete and accurate to the best of my ability. I acknowledge that Practitioner's advice, recommendations, and/or decision may be based on factors not within their control, such as incomplete or inaccurate data provided by me or distortions of diagnostic images or specimens that may result from electronic transmissions. I understand that the practice of medicine is not an exact science and that Practitioner makes no warranties or guarantees regarding treatment outcomes. I acknowledge that a copy of this consent can be made available to me via my patient portal ([facility]), or I can request a printed copy by calling the office of [facility].    I understand that my insurance will be billed for this visit.   I have read or had this consent read to me. I understand the contents of this consent, which adequately explains the benefits and risks of the Services being provided via telemedicine.  I have been provided ample opportunity to ask questions regarding this consent and the Services and have had my questions answered to my satisfaction. I give my informed consent for the services to be provided through the use of telemedicine in my medical care

## 2021-12-27 NOTE — Telephone Encounter (Signed)
   Name: Carlos Moreno  DOB: 1946/02/07  MRN: [id number]  Primary Cardiologist: Minus Breeding, MD   Preoperative team, please contact this patient and set up a phone call appointment for further preoperative risk assessment. Please obtain consent and complete medication review. Thank you for your help.  I confirm that guidance regarding antiplatelet and oral anticoagulation therapy has been completed and, if necessary, noted below.   Per office protocol, patient can hold Eliquis for 3 days prior to procedure.      Lenna Sciara, NP 12/27/2021, 12:12 PM [place]

## 2022-01-02 ENCOUNTER — Ambulatory Visit: Payer: Medicare Other

## 2022-01-08 ENCOUNTER — Ambulatory Visit: Payer: Medicare Other | Attending: Cardiology | Admitting: Physician Assistant

## 2022-01-08 ENCOUNTER — Telehealth: Payer: Medicare Other

## 2022-01-08 DIAGNOSIS — Z01810 Encounter for preprocedural cardiovascular examination: Secondary | ICD-10-CM | POA: Diagnosis not present

## 2022-01-08 NOTE — Progress Notes (Signed)
Virtual Visit via Telephone Note   Because of Carlos Moreno's co-morbid illnesses, he is at least at moderate risk for complications without adequate follow up.  This format is felt to be most appropriate for this patient at this time.  The patient did not have access to video technology/had technical difficulties with video requiring transitioning to audio format only (telephone).  All issues noted in this document were discussed and addressed.  No physical exam could be performed with this format.  Please refer to the patient's chart for his consent to telehealth for Carlos Moreno.  Evaluation Performed:  Preoperative cardiovascular risk assessment _____________   Date:  01/08/2022   Patient ID:  Carlos Moreno, DOB [birth year], MRN [id number] Patient Location:  Home Provider location:   Office  Primary Care Provider:  Crist Infante, MD Primary Cardiologist:  Carlos Breeding, MD  Chief Complaint / Patient Profile   76 y.o. y/o male with a h/o atrial flutter on eliquis, remote stenting for CAD in 2005 and 2007, and OSA on CPAP who is pending lumbar fusion and presents today for telephonic preoperative cardiovascular risk assessment.  Past Medical History    Past Medical History:  Diagnosis Date   Anemia    Atrial flutter with rapid ventricular response (Carlos More[place]) 07/27/2019   Basal cell carcinoma    CAD (coronary artery disease) 2005    PCI to mid LAD with a 3.0 x 15 mm Resolute Integrity DES May 2017. LAD Stent 2005 (Details not available)   Cataract    CKD stage 3 due to type 2 diabetes mellitus (Carlos More[place]) 07/27/2019   Curvature of spine    Diabetes mellitus type 2, diet-controlled (Carlos More[place]) 07/27/2019   Gallstones    Hypertension    Kidney stones    Macular degeneration    Obesity    OSA (obstructive sleep apnea)    CPAP   Past Surgical History:  Procedure Laterality Date   ABDOMINOPLASTY  02/2009   CARDIOVERSION N/A 07/29/2019   Procedure: CARDIOVERSION;  Surgeon:  Donato Heinz, MD;  Location: [facility];  Service: Cardiovascular;  Laterality: N/A;   CATARACT EXTRACTION, BILATERAL  03/2007   CHOLECYSTECTOMY  2001   COLONOSCOPY  '08/07 11/12 04/16 '$   CORONARY ANGIOPLASTY WITH STENT PLACEMENT  07/2003   Stent LAD, 06/2015 DES LAD   HIP SURGERY Left    INGUINAL HERNIA REPAIR Left 1956   KNEE ARTHROSCOPY Right 05/2005   x3   LAMINECTOMY  2009   Lumbar   REPLACEMENT TOTAL KNEE Left 1994   REPLACEMENT TOTAL KNEE Right    x 3   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   TEE WITHOUT CARDIOVERSION N/A 07/29/2019   Procedure: TRANSESOPHAGEAL ECHOCARDIOGRAM (TEE);  Surgeon: Donato Heinz, MD;  Location: [facility] ENDOSCOPY;  Service: Cardiovascular;  Laterality: N/A;   TOTAL HIP ARTHROPLASTY Right 01/2007    Allergies  No Known Allergies  History of Present Illness    Carlos Moreno is a 76 y.o. male who presents via audio/video conferencing for a telehealth visit today.  Pt was last seen in cardiology clinic on 02/26/21 by Dr. Percival Spanish.  At that time [name] was doing well, but limited by back pain  The patient is now pending procedure as outlined above. Since his last visit, he remains active and can complete more than 4.0 METS without angina.  He can climb a flight of stairs, walk on level ground, and walks the Carlos Moreno of the grocery store without issues.   Home Medications  Prior to Admission medications   Medication Sig Start Date End Date Taking? Authorizing Provider  acetaminophen (TYLENOL) 500 MG tablet Take 500 mg by mouth every 6 (six) hours as needed for mild pain.    [provider]  apixaban (ELIQUIS) 5 MG TABS tablet Take 1 tablet (5 mg total) by mouth 2 (two) times daily. 02/26/21   Carlos Breeding, MD  atorvastatin (LIPITOR) 40 MG tablet Take 40 mg by mouth daily. 12/19/16   [provider]  b complex vitamins capsule Take 1 capsule by mouth every other day.     [provider]   Calcium-Phosphorus-Vitamin D (CALCIUM GUMMIES PO) Take 2 tablets by mouth daily. Takes two daily     [provider]  cetirizine (ZYRTEC) 10 MG tablet Take 10 mg by mouth daily.    [provider]  famotidine (PEPCID) 20 MG tablet TAKE ONE TABLET BY MOUTH TWICE A DAY 03/13/21   Nandigam, Venia Minks, MD  fluticasone (FLONASE) 50 MCG/ACT nasal spray Place 1 spray into both nostrils daily as needed for allergies or rhinitis.    [provider]  HYDROcodone-acetaminophen (NORCO/VICODIN) 5-325 MG tablet Take 1 tablet by mouth every 6 (six) hours as needed for moderate pain. 01/25/21   Fredia Sorrow, MD  isosorbide mononitrate (IMDUR) 30 MG 24 hr tablet Take 30 mg by mouth daily. 12/19/16   [provider]  JARDIANCE 25 MG TABS tablet Take 1 tablet (25 mg total) by mouth daily. Do not restart until you follow up with your PCP 07/29/19   Buford Dresser, MD  losartan (COZAAR) 25 MG tablet Take 25 mg by mouth daily. 11/03/19   [provider]  Melatonin ER 5 MG TBCR Take 5 mg by mouth every evening.     [provider]  Multiple Vitamins-Minerals (VISION FORMULA/LUTEIN) TABS Take 1 tablet by mouth daily.  10/10/14   [provider]  nebivolol (BYSTOLIC) 10 MG tablet Take 10 mg by mouth daily.    [provider]  nitroGLYCERIN (NITROSTAT) 0.4 MG SL tablet Place 1 tablet (0.4 mg total) under the tongue every 5 (five) minutes as needed for chest pain. 02/26/21 12/27/21  Carlos Breeding, MD  Polysaccharide Iron Complex (IRON UP PO) Take 120 mg by mouth daily.    [provider]  tiZANidine (ZANAFLEX) 4 MG tablet Take 4 mg by mouth at bedtime. 03/30/19   [provider]  TRADJENTA 5 MG TABS tablet Take 5 mg by mouth daily.  06/08/18   [provider]  traMADol (ULTRAM) 50 MG tablet Take 50 mg by mouth every 6 (six) hours as needed for moderate pain.     [provider]  vitamin C (ASCORBIC ACID) 500 MG  tablet Take 500 mg by mouth daily.    [provider]  zinc gluconate 50 MG tablet Take 50 mg by mouth daily.  03/17/14   [provider]    Physical Exam    Vital Signs:  Koah Chisenhall does not have vital signs available for review today.  Given telephonic nature of communication, physical exam is limited. AAOx3. NAD. Normal affect.  Speech and respirations are unlabored.  Accessory Clinical Findings    None  Assessment & Plan    1.  Preoperative Cardiovascular Risk Assessment:  He has a history of CAD with prior stenting.  He can complete more than 4.0 METS without angina.  According to the RCRI, he has a 0.9% risk of [place] during the perioperative period.  He understands his risk and wishes to proceed.  Therefore, based on ACC/AHA guidelines, the patient would be at acceptable risk for the planned procedure without further cardiovascular testing.   The patient was advised that if he develops new symptoms prior to surgery to contact our office to arrange for a follow-up visit, and he verbalized understanding.   2. Chronic anticoagulation  Per office protocol, patient can hold Eliquis for 3 days prior to procedure.   A copy of this note will be routed to requesting surgeon.  Time:   Today, I have spent 8 minutes with the patient with telehealth technology discussing medical history, symptoms, and management plan.     Tami Lin Tenesha Garza, PA  01/08/2022, 3:30 PM

## 2022-01-23 ENCOUNTER — Other Ambulatory Visit: Payer: Self-pay | Admitting: Neurosurgery

## 2022-01-29 NOTE — Pre-Procedure Instructions (Signed)
Surgical Instructions    Your procedure is scheduled on Tuesday, January 9th.  Report to [facility] Main Entrance "A" at 09:40 A.M., then check in with the Admitting office.  Call this number if you have problems the morning of surgery:  [phone]   If you have any questions prior to your surgery date call [phone]: Open Monday-Friday 8am-4pm    Remember:  Do not eat or drink after midnight the night before your surgery     Take these medicines the morning of surgery with A SIP OF WATER  atorvastatin (LIPITOR)  famotidine (PEPCID)  isosorbide mononitrate (IMDUR)  nebivolol (BYSTOLIC)     If needed: acetaminophen (TYLENOL)  cetirizine (ZYRTEC)  fluticasone (FLONASE)  HYDROcodone-acetaminophen (NORCO/VICODIN)  nitroGLYCERIN (NITROSTAT)  traMADol (ULTRAM)    Hold Eliquis 2 days prior to surgery. Last dose 02/16/21.   As of today, STOP taking any Aspirin (unless otherwise instructed by your surgeon) Aleve, Naproxen, Ibuprofen, Motrin, Advil, Goody's, BC's, all herbal medications, fish oil, and all vitamins.   WHAT DO I DO ABOUT MY DIABETES MEDICATION?   Do not take TRADJENTA the morning of surgery.  Hold Jardiance 72 hours prior to surgery. Last dose 02/15/21.         HOW TO MANAGE YOUR DIABETES BEFORE AND AFTER SURGERY  Why is it important to control my blood sugar before and after surgery? Improving blood sugar levels before and after surgery helps healing and can limit problems. A way of improving blood sugar control is eating a healthy diet by:  Eating less sugar and carbohydrates  Increasing activity/exercise  Talking with your doctor about reaching your blood sugar goals High blood sugars (greater than 180 mg/dL) can raise your risk of infections and slow your recovery, so you will need to focus on controlling your diabetes during the weeks before surgery. Make sure that the doctor who takes care of your diabetes knows about your planned surgery  including the date and location.  How do I manage my blood sugar before surgery? Check your blood sugar at least 4 times a day, starting 2 days before surgery, to make sure that the level is not too high or low.  Check your blood sugar the morning of your surgery when you wake up and every 2 hours until you get to the Short Stay unit.  If your blood sugar is less than 70 mg/dL, you will need to treat for low blood sugar: Do not take insulin. Treat a low blood sugar (less than 70 mg/dL) with  cup of clear juice (cranberry or apple), 4 glucose tablets, OR glucose gel. Recheck blood sugar in 15 minutes after treatment (to make sure it is greater than 70 mg/dL). If your blood sugar is not greater than 70 mg/dL on recheck, call [phone] for further instructions. Report your blood sugar to the short stay nurse when you get to Short Stay.  If you are admitted to the hospital after surgery: Your blood sugar will be checked by the staff and you will probably be given insulin after surgery (instead of oral diabetes medicines) to make sure you have good blood sugar levels. The goal for blood sugar control after surgery is 80-180 mg/dL.                     Do NOT Smoke (Tobacco/Vaping) for 24 hours prior to your procedure.  If you use a CPAP at night, you may bring your mask/headgear for your overnight stay.   Contacts,  glasses, piercing's, hearing aid's, dentures or partials may not be worn into surgery, please bring cases for these belongings.    For patients admitted to the hospital, discharge time will be determined by your treatment team.   Patients discharged the day of surgery will not be allowed to drive home, and someone needs to stay with them for 24 hours.  SURGICAL WAITING ROOM VISITATION Patients having surgery or a procedure may have no more than 2 support people in the waiting area - these visitors may rotate.   Children under the age of 81 must have an adult with them who is not  the patient. If the patient needs to stay at the hospital during part of their recovery, the visitor guidelines for inpatient rooms apply. Pre-op nurse will coordinate an appropriate time for 1 support person to accompany patient in pre-op.  This support person may not rotate.   Please refer to the [facility] website for the visitor guidelines for Inpatients (after your surgery is over and you are in a regular room).    Special instructions:   [facility]- Preparing For Surgery  Before surgery, you can play an important role. Because skin is not sterile, your skin needs to be as free of germs as possible. You can reduce the number of germs on your skin by washing with CHG (chlorahexidine gluconate) Soap before surgery.  CHG is an antiseptic cleaner which kills germs and bonds with the skin to continue killing germs even after washing.    Oral Hygiene is also important to reduce your risk of infection.  Remember - BRUSH YOUR TEETH THE MORNING OF SURGERY WITH YOUR REGULAR TOOTHPASTE  Please do not use if you have an allergy to CHG or antibacterial soaps. If your skin becomes reddened/irritated stop using the CHG.  Do not shave (including legs and underarms) for at least 48 hours prior to first CHG shower. It is OK to shave your face.  Please follow these instructions carefully.   Shower the NIGHT BEFORE SURGERY and the MORNING OF SURGERY  If you chose to wash your hair, wash your hair first as usual with your normal shampoo.  After you shampoo, rinse your hair and body thoroughly to remove the shampoo.  Use CHG Soap as you would any other liquid soap. You can apply CHG directly to the skin and wash gently with a scrungie or a clean washcloth.   Apply the CHG Soap to your body ONLY FROM THE NECK DOWN.  Do not use on open wounds or open sores. Avoid contact with your eyes, ears, mouth and genitals (private parts). Wash Face and genitals (private parts)  with your normal soap.   Wash  thoroughly, paying special attention to the area where your surgery will be performed.  Thoroughly rinse your body with warm water from the neck down.  DO NOT shower/wash with your normal soap after using and rinsing off the CHG Soap.  Pat yourself dry with a CLEAN TOWEL.  Wear CLEAN PAJAMAS to bed the night before surgery  Place CLEAN SHEETS on your bed the night before your surgery  DO NOT SLEEP WITH PETS.   Day of Surgery: Take a shower with CHG soap. Do not wear jewelry  Do not wear lotions, powders, colognes, or deodorant. Men may shave face and neck. Do not bring valuables to the hospital. [facility] is not responsible for any belongings or valuables.  Wear Clean/Comfortable clothing the morning of surgery Remember to brush your teeth  WITH YOUR REGULAR TOOTHPASTE.   Please read over the following fact sheets that you were given.    If you received a COVID test during your pre-op visit  it is requested that you wear a mask when out in public, stay away from anyone that may not be feeling well and notify your surgeon if you develop symptoms. If you have been in contact with anyone that has tested positive in the last 10 days please notify you surgeon.

## 2022-01-30 ENCOUNTER — Encounter (HOSPITAL_COMMUNITY)
Admission: RE | Admit: 2022-01-30 | Discharge: 2022-01-30 | Disposition: A | Discharge status: Home / Self Care | Payer: Medicare Other | Source: Ambulatory Visit | Attending: Neurosurgery | Admitting: Neurosurgery

## 2022-01-30 ENCOUNTER — Encounter (HOSPITAL_COMMUNITY): Payer: Self-pay

## 2022-01-30 ENCOUNTER — Other Ambulatory Visit: Payer: Self-pay

## 2022-01-30 VITALS — BP 157/62 | HR 62 | Temp 97.5°F | Resp 18 | Ht 69.0 in | Wt 215.4 lb

## 2022-01-30 DIAGNOSIS — E119 Type 2 diabetes mellitus without complications: Secondary | ICD-10-CM | POA: Diagnosis not present

## 2022-01-30 DIAGNOSIS — Z789 Other specified health status: Secondary | ICD-10-CM | POA: Insufficient documentation

## 2022-01-30 DIAGNOSIS — Z01818 Encounter for other preprocedural examination: Secondary | ICD-10-CM | POA: Insufficient documentation

## 2022-01-30 HISTORY — DX: Unspecified osteoarthritis, unspecified site: M19.90

## 2022-01-30 LAB — CBC
HCT: 35.7 % — ABNORMAL LOW (ref 39.0–52.0)
Hemoglobin: 11.6 g/dL — ABNORMAL LOW (ref 13.0–17.0)
MCH: 34.2 pg — ABNORMAL HIGH (ref 26.0–34.0)
MCHC: 32.5 g/dL (ref 30.0–36.0)
MCV: 105.3 fL — ABNORMAL HIGH (ref 80.0–100.0)
Platelets: 179 10*3/uL (ref 150–400)
RBC: 3.39 MIL/uL — ABNORMAL LOW (ref 4.22–5.81)
RDW: 13.9 % (ref 11.5–15.5)
WBC: 6.9 10*3/uL (ref 4.0–10.5)
nRBC: 0 % (ref 0.0–0.2)

## 2022-01-30 LAB — COMPREHENSIVE METABOLIC PANEL
ALT: 38 U/L (ref 0–44)
AST: 29 U/L (ref 15–41)
Albumin: 4 g/dL (ref 3.5–5.0)
Alkaline Phosphatase: 77 U/L (ref 38–126)
Anion gap: 8 (ref 5–15)
BUN: 39 mg/dL — ABNORMAL HIGH (ref 8–23)
CO2: 23 mmol/L (ref 22–32)
Calcium: 9.4 mg/dL (ref 8.9–10.3)
Chloride: 111 mmol/L (ref 98–111)
Creatinine, Ser: 1.99 mg/dL — ABNORMAL HIGH (ref 0.61–1.24)
GFR, Estimated: 34 mL/min — ABNORMAL LOW (ref >60)
Glucose, Bld: 151 mg/dL — ABNORMAL HIGH (ref 70–99)
Potassium: 5.2 mmol/L — ABNORMAL HIGH (ref 3.5–5.1)
Sodium: 142 mmol/L (ref 135–145)
Total Bilirubin: 1.2 mg/dL (ref 0.3–1.2)
Total Protein: 6.7 g/dL (ref 6.5–8.1)

## 2022-01-30 LAB — GLUCOSE, CAPILLARY: Glucose-Capillary: 177 mg/dL — ABNORMAL HIGH (ref 70–99)

## 2022-01-30 LAB — SURGICAL PCR SCREEN
MRSA, PCR: NEGATIVE
Staphylococcus aureus: POSITIVE — AB

## 2022-01-30 LAB — HEMOGLOBIN A1C
Hgb A1c MFr Bld: 6.2 % — ABNORMAL HIGH (ref 4.8–5.6)
Mean Plasma Glucose: 131 mg/dL

## 2022-01-30 LAB — ABO/RH: ABO/RH(D): A POS

## 2022-01-30 NOTE — Progress Notes (Signed)
PCP - Dr. Crist Infante Cardiologist - Dr. Minus Breeding  PPM/ICD - denies   Chest x-ray - 07/27/19 EKG - 02/26/21 Stress Test - 01/20/14 ECHO - 07/29/19 Cardiac Cath - 06/19/15  Sleep Study - 10+ years ago CPAP - nightly, pressure settings 4.5-9  DM- Type 2 Pt does not check CBG at home  Last dose of GLP1 agonist-  n/a   Blood Thinner Instructions: Hold Eliquis 2 days Aspirin Instructions: n/a  ERAS Protcol - no, NPO   COVID TEST- n/a   Anesthesia review:   Patient denies shortness of breath, fever, cough and chest pain at PAT appointment   All instructions explained to the patient, with a verbal understanding of the material. Patient agrees to go over the instructions while at home for a better understanding. The opportunity to ask questions was provided.

## 2022-01-31 ENCOUNTER — Encounter (HOSPITAL_COMMUNITY): Payer: Self-pay | Admitting: Physician Assistant

## 2022-01-31 NOTE — Progress Notes (Signed)
Anesthesia Chart Review:  Follows with cardiology for hx of aflutter on eliquis, remote stenting for CAD in 2005 and 2007, OSA on CPAP. Seen by Fabian Sharp, PA-C 01/08/22 for proep eval. Per note, "He has a history of CAD with prior stenting.  He can complete more than 4.0 METS without angina.  According to the RCRI, he has a 0.9% risk of Mace during the perioperative period.  He understands his risk and wishes to proceed. Therefore, based on ACC/AHA guidelines, the patient would be at acceptable risk for the planned procedure without further cardiovascular testing." Cleared to hold Eliquis for 3 days.  Hx of CKD 3.  NIDDM2. A1c 6.2 on preop labs.  Preop labs reviewed, creatinine elevated at 1.99 c/w history of CKD (baseline appears to be ~1.8 based on available labs), mild anemia with hgb 11.6.  EKG 02/26/21: Junctional rhythm. Rate 49.  TEE 07/29/19:  1. Left ventricular ejection fraction, by estimation, is 55 to 60%. The  left ventricle has normal function. The left ventricle has no regional  wall motion abnormalities. There is moderate left ventricular hypertrophy.   2. Right ventricular systolic function is normal. The right ventricular  size is mildly enlarged.   3. Left atrial size was mildly dilated. No left atrial/left atrial  appendage thrombus was detected.   4. The mitral valve is normal in structure. Mild mitral valve  regurgitation.   5. Tricuspid valve regurgitation is mild to moderate.   6. The aortic valve is tricuspid. Aortic valve regurgitation is trivial.   7. Transgastric views were not attempted given history of bariatric  surgery with gastric pouch   8. No LAA appendage thrombus seen. Following TEE, patient underwent DCCV  with 200J x1, with conversion to normal sinus rhythm   TTE 07/29/19:  1. Technically difficult study due to poor sound wave transmission.   2. Left ventricular ejection fraction, by estimation, is 55 to 60%. The  left ventricle has normal  function. The left ventricle has no regional  wall motion abnormalities. There is mild left ventricular hypertrophy.  Left ventricular diastolic parameters  were normal.   3. Right ventricular systolic function is normal. The right ventricular  size is normal. There is normal pulmonary artery systolic pressure.   4. Left atrial size was moderately dilated.   5. The mitral valve is normal in structure. Trivial mitral valve  regurgitation.   6. The aortic valve is normal in structure. Aortic valve regurgitation is  trivial.   7. Aortic dilatation noted. 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    Wynonia Musty [facility] Short Stay Center/Anesthesiology Phone [phone] 01/31/2022 1:06 PM

## 2022-01-31 NOTE — Anesthesia Preprocedure Evaluation (Deleted)
Anesthesia Evaluation    Reviewed: Allergy & Precautions, Patient's Chart, lab work & pertinent test results  History of Anesthesia Complications Negative for: history of anesthetic complications  Airway        Dental   Pulmonary sleep apnea and Continuous Positive Airway Pressure Ventilation , former smoker          Cardiovascular hypertension, + CAD and + Cardiac Stents  + dysrhythmias Atrial Fibrillation       TRANSESOPHOGEAL ECHO REPORT       Patient Name:   Carlos Moreno Date of Exam: 07/29/2019 Medical Rec #:  [id number]       Height:       69.5 in Accession #:    [id number]      Weight:       245.6 lb Date of Birth:  1945-05-30       BSA:          2.266 m Patient Age:    75 years        BP:           124/77 mmHg Patient Gender: M               HR:           85 bpm. Exam Location:  Inpatient  Procedure: Transesophageal Echo, Color Doppler and Cardiac Doppler  Indications:     I48.92* Unspecified atrial flutter   History:         Patient has prior history of Echocardiogram examinations, most                  recent 07/29/2019. CAD, Arrythmias:Atrial Flutter; Risk                  Factors:Hypertension, Diabetes, Dyslipidemia and Sleep Apnea.   Sonographer:     Raquel Sarna Senior RDCS Referring Phys:  [place] G BARRETT Diagnosing Phys: Oswaldo Milian MD  PROCEDURE: After discussion of the risks and benefits of a TEE, an informed consent was obtained from the patient. The transesophogeal probe was passed without difficulty through the esophogus of the patient. Sedation performed by different physician. The patient was monitored while under deep sedation. Anesthestetic sedation was provided intravenously by Anesthesiology: '332mg'$  of Propofol. The patient developed no complications during the procedure. A successful direct current cardioversion was performed at 200 joules with 1 attempt.  IMPRESSIONS    1. Left  ventricular ejection fraction, by estimation, is 55 to 60%. The left ventricle has normal function. The left ventricle has no regional wall motion abnormalities. There is moderate left ventricular hypertrophy.  2. Right ventricular systolic function is normal. The right ventricular size is mildly enlarged.  3. Left atrial size was mildly dilated. No left atrial/left atrial appendage thrombus was detected.  4. The mitral valve is normal in structure. Mild mitral valve regurgitation.  5. Tricuspid valve regurgitation is mild to moderate.  6. The aortic valve is tricuspid. Aortic valve regurgitation is trivial.  7. Transgastric views were not attempted given history of bariatric surgery with gastric pouch  8. No LAA appendage thrombus seen. Following TEE, patient underwent DCCV with 200J x1, with conversion to normal sinus rhythm     Neuro/Psych negative neurological ROS     GI/Hepatic negative GI ROS, Neg liver ROS,,,  Endo/Other  diabetes    Renal/GU negative Renal ROS     Musculoskeletal negative musculoskeletal ROS (+)    Abdominal   Peds  Hematology negative hematology ROS (+)   Anesthesia  Other Findings   Reproductive/Obstetrics                              Anesthesia Physical Anesthesia Plan  ASA: 3  Anesthesia Plan: General   Post-op Pain Management: Tylenol PO (pre-op)*   Induction: Intravenous  PONV Risk Score and Plan: 3 and Ondansetron, Dexamethasone and Diphenhydramine  Airway Management Planned: Oral ETT  Additional Equipment:   Intra-op Plan:   Post-operative Plan: Extubation in OR  Informed Consent:   Plan Discussed with: Anesthesiologist  Anesthesia Plan Comments: (PAT note by Karoline Caldwell, PA-C: Follows with cardiology for hx of aflutter on eliquis, remote stenting for CAD in 2005 and 2007, OSA on CPAP. Seen by Fabian Sharp, PA-C 01/08/22 for proep eval. Per note, "He has a history of CAD with prior stenting.   He can complete more than 4.0 METS without angina.  According to the RCRI, he has a 0.9% risk of Mace during the perioperative period.  He understands his risk and wishes to proceed. Therefore, based on ACC/AHA guidelines, the patient would be at acceptable risk for the planned procedure without further cardiovascular testing." Cleared to hold Eliquis for 3 days.  Hx of CKD 3.  NIDDM2. A1c 6.2 on preop labs.  Preop labs reviewed, creatinine elevated at 1.99 c/w history of CKD (baseline appears to be ~1.8 based on available labs), mild anemia with hgb 11.6.  EKG 02/26/21: Junctional rhythm. Rate 49.  TEE 07/29/19:  1. Left ventricular ejection fraction, by estimation, is 55 to 60%. The  left ventricle has normal function. The left ventricle has no regional  wall motion abnormalities. There is moderate left ventricular hypertrophy.   2. Right ventricular systolic function is normal. The right ventricular  size is mildly enlarged.   3. Left atrial size was mildly dilated. No left atrial/left atrial  appendage thrombus was detected.   4. The mitral valve is normal in structure. Mild mitral valve  regurgitation.   5. Tricuspid valve regurgitation is mild to moderate.   6. The aortic valve is tricuspid. Aortic valve regurgitation is trivial.   7. Transgastric views were not attempted given history of bariatric  surgery with gastric pouch   8. No LAA appendage thrombus seen. Following TEE, patient underwent DCCV  with 200J x1, with conversion to normal sinus rhythm   TTE 07/29/19:  1. Technically difficult study due to poor sound wave transmission.   2. Left ventricular ejection fraction, by estimation, is 55 to 60%. The  left ventricle has normal function. The left ventricle has no regional  wall motion abnormalities. There is mild left ventricular hypertrophy.  Left ventricular diastolic parameters  were normal.   3. Right ventricular systolic function is normal. The right ventricular   size is normal. There is normal pulmonary artery systolic pressure.   4. Left atrial size was moderately dilated.   5. The mitral valve is normal in structure. Trivial mitral valve  regurgitation.   6. The aortic valve is normal in structure. Aortic valve regurgitation is  trivial.   7. Aortic dilatation noted. 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    )         Anesthesia Quick Evaluation

## 2022-02-19 ENCOUNTER — Inpatient Hospital Stay (HOSPITAL_COMMUNITY): Admission: RE | Admit: 2022-02-19 | Payer: Medicare Other | Source: Home / Self Care

## 2022-02-19 SURGERY — ANTERIOR LATERAL LUMBAR FUSION 1 LEVEL
Anesthesia: General

## 2022-05-22 NOTE — Progress Notes (Unsigned)
Primary GI:  Marsa Aris, MD Last seen May 2021  Assessment   77 y.o. yo male with the following:   *History of colon polyps.  Three tubular adenomas in March 2021. Due for 3 year surveillance colonoscopy   *[facility] of colon cancer in father in his 81's.   *Occasional hemorrhoidal bleeding, none in 6 months.  No constipation. Stools soft, doesn't strain.   *History of gastric bypass in 2008  *Chronic macrocytic anemia. Baseline hgb in mid 11 range On daily oral iron, B complex vitamin  *History of gastritis GI bleed at outside facility.  Was on pantoprazole but given history of gastric bypass surgery we changed him to BID pepcid at last visit in May 2021.   *DM On Jardiance and Tradjenta  *CKD3  *CAD s/p remote PCI  *Atrial Flutter s/p TEE conversion in 2021. On Eliquis   Plan   -Schedule for a colonoscopy. The risks and benefits of colonoscopy with possible polypectomy / biopsies were discussed and the patient agrees to proceed.  -Hold Eliquis for 3 days ( creatinine clearance 43.7) before procedure. Will instruct when and how to resume after procedure. Patient understands that there is a low but real risk of cardiovascular event such as heart attack, stroke, or embolism /  thrombosis while off blood thinner. The patient consents to proceed. Will communicate by phone or EMR with patient's prescribing provider to confirm that holding Eliquis is reasonable in this case.  -Hold oral iron 5 days prior to colonoscopy    History of Present Illness   Chief complaint: history of colon polyps    Carlos Moreno is a 77 y.o. male known to Dr. Lavon Paganini with a past medical history of HTN, Aflutter, CKD 3, DM, OSA, OA, sleep apnea wears CPCP, gastric bypass, gastritis,  , colon polyps, [facility] of colon cancer in father in late 24's. See PMH / PSH for additional details.   Art is due for 3 year surveillance colonoscopy.  He has occasional hemorrhoidal bleeding, none in the last 6  months.  His stools are generally on the soft side and he does not strain.  No other GI complaints.  He is still taking Pepcid twice daily as we recommended in May 2021 for history of gastritis and GI bleed(unknown source)  Ever time he eats his nose starts running and he has a build up of phlegm in throat.   No recent chest pain or SHOB.    Imaging:  Echo June 2021 Left ventricular ejection fraction, by estimation, is 55 to 60%. The left ventricle has normal function. The left ventricle has no regional wall motion abnormalities. There is moderate left ventricular hypertrophy. 2. Right ventricular systolic function is normal. The right ventricular size is mildly enlarged. 3. Left atrial size was mildly dilated. No left atrial/left atrial appendage thrombus was detected. 4. The mitral valve is normal in structure. Mild mitral valve regurgitation. 5. Tricuspid valve regurgitation is mild to moderate. 6. The aortic valve is tricuspid. Aortic valve regurgitation is trivial. 7. Transgastric views were not attempted given history of bariatric surgery with gastric pouch 8. No LAA appendage thrombus seen. Following TEE, patient underwent DCCV with 200J x1, with conversion to normal sinus rhythm   Labs:     Latest Ref Rng & Units 01/30/2022    9:40 AM 07/28/2019    4:24 AM  Hepatic Function  Total Protein 6.5 - 8.1 g/dL 6.7  6.2   Albumin 3.5 - 5.0 g/dL 4.0  3.4  AST 15 - 41 U/L 29  16   ALT 0 - 44 U/L 38  39   Alk Phosphatase 38 - 126 U/L 77  64   Total Bilirubin 0.3 - 1.2 mg/dL 1.2  1.8        Latest Ref Rng & Units 01/30/2022    9:40 AM 01/25/2021    7:00 AM 07/29/2019   11:30 AM  CBC  WBC 4.0 - 10.5 K/uL 6.9  7.4    Hemoglobin 13.0 - 17.0 g/dL 05.6  97.9  48.0   Hematocrit 39.0 - 52.0 % 35.7  35.5  42.0   Platelets 150 - 400 K/uL 179  154       Previous GI Evaluation   EGD March 2021: S/p gastric bypass.  Gastric pouch and gastrojejunal anastomosis with congestion, erythema and  linear erosions.  Otherwise rest of exam was unremarkable.  Gastric biopsies negative for H. pylori   Polyp surveillance colonoscopy March 2021 -Two 4 to 6 mm polyps in the transverse colon and in the cecum.  One less than 1 mm polyp in the cecum. Moderate diverticulosis in the sigmoid colon, in the descending colon, in the transverse colon and in the ascending colon. There was evidence of an impacted diverticulum.  External and internal hemorrhoids.   Surgical [P], gastric pouch - GASTRIC OXYNTIC MUCOSA WITH MILD CHRONIC GASTRITIS - WARTHIN STARRY STAIN IS NEGATIVE FOR HELICOBACTER PYLORI 2. Surgical [P], colon, cecum, transverse, polyp (3) - TUBULAR ADENOMA(S) - NEGATIVE FOR HIGH-GRADE DYSPLASIA OR MALIGNANCY   Past Medical History:  Diagnosis Date   Anemia    Arthritis    Atrial flutter with rapid ventricular response (HCC) 07/27/2019   Basal cell carcinoma    CAD (coronary artery disease) 2005    PCI to mid LAD with a 3.0 x 15 mm Resolute Integrity DES May 2017. LAD Stent 2005 (Details not available)   Cataract    CKD stage 3 due to type 2 diabetes mellitus (HCC) 07/27/2019   Curvature of spine    Diabetes mellitus type 2, diet-controlled (HCC) 07/27/2019   Gallstones    Hypertension    Kidney stones    Macular degeneration    Obesity    OSA (obstructive sleep apnea)    CPAP    Past Surgical History:  Procedure Laterality Date   ABDOMINOPLASTY  02/2009   CARDIOVERSION N/A 07/29/2019   Procedure: CARDIOVERSION;  Surgeon: Little Ishikawa, MD;  Location: [facility] ENDOSCOPY;  Service: Cardiovascular;  Laterality: N/A;   CATARACT EXTRACTION, BILATERAL  03/2007   CHOLECYSTECTOMY  2001   COLONOSCOPY  08/07 11/12 04/16    CORONARY ANGIOPLASTY WITH STENT PLACEMENT  07/2003   Stent LAD, 06/2015 DES LAD   HIP SURGERY Left    INGUINAL HERNIA REPAIR Left 1956   KNEE ARTHROSCOPY Right 05/2005   x3   LAMINECTOMY  2009   Lumbar   REPLACEMENT TOTAL KNEE Left 1994   REPLACEMENT  TOTAL KNEE Right    x 3   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   TEE WITHOUT CARDIOVERSION N/A 07/29/2019   Procedure: TRANSESOPHAGEAL ECHOCARDIOGRAM (TEE);  Surgeon: Little Ishikawa, MD;  Location: [facility] ENDOSCOPY;  Service: Cardiovascular;  Laterality: N/A;   TOTAL HIP ARTHROPLASTY Right 01/2007    Current Medications, Allergies, Family History and Social History were reviewed in [employer] record.     Current Outpatient Medications  Medication Sig Dispense Refill   acetaminophen (TYLENOL) 500 MG tablet Take 500 mg by mouth every  6 (six) hours as needed for mild pain.     apixaban (ELIQUIS) 5 MG TABS tablet Take 1 tablet (5 mg total) by mouth 2 (two) times daily. 180 tablet 1   atorvastatin (LIPITOR) 40 MG tablet Take 40 mg by mouth daily.  3   b complex vitamins capsule Take 1 capsule by mouth every other day.      Calcium-Phosphorus-Vitamin D (CALCIUM GUMMIES PO) Take 2 tablets by mouth daily.     cetirizine (ZYRTEC) 10 MG tablet Take 10 mg by mouth daily as needed for allergies.     Cholecalciferol (VITAMIN D) 125 MCG (5000 UT) CAPS Take 5,000 Units by mouth daily.     famotidine (PEPCID) 20 MG tablet TAKE ONE TABLET BY MOUTH TWICE A DAY 180 tablet 3   fluticasone (FLONASE) 50 MCG/ACT nasal spray Place 1 spray into both nostrils daily as needed for allergies or rhinitis.     HYDROcodone-acetaminophen (NORCO/VICODIN) 5-325 MG tablet Take 1 tablet by mouth every 6 (six) hours as needed for moderate pain. 20 tablet 0   isosorbide mononitrate (IMDUR) 30 MG 24 hr tablet Take 30 mg by mouth daily.  3   JARDIANCE 25 MG TABS tablet Take 1 tablet (25 mg total) by mouth daily. Do not restart until you follow up with your PCP 30 tablet    losartan (COZAAR) 25 MG tablet Take 25 mg by mouth daily.     Melatonin ER 5 MG TBCR Take 5 mg by mouth at bedtime as needed (sleep).     Multiple Vitamins-Minerals (VISION FORMULA/LUTEIN) TABS Take 1 tablet by mouth daily.       nebivolol (BYSTOLIC) 10 MG tablet Take 10 mg by mouth daily.     nitroGLYCERIN (NITROSTAT) 0.4 MG SL tablet Place 1 tablet (0.4 mg total) under the tongue every 5 (five) minutes as needed for chest pain. 25 tablet 3   Polysaccharide Iron Complex (IRON UP PO) Take 120 mg by mouth daily.     tiZANidine (ZANAFLEX) 4 MG tablet Take 4 mg by mouth at bedtime.     TRADJENTA 5 MG TABS tablet Take 5 mg by mouth daily.      traMADol (ULTRAM) 50 MG tablet Take 50 mg by mouth every 6 (six) hours as needed for moderate pain.      vitamin C (ASCORBIC ACID) 500 MG tablet Take 500 mg by mouth daily.     zinc gluconate 50 MG tablet Take 50 mg by mouth daily.      No current facility-administered medications for this visit.    Review of Systems: No chest pain. No shortness of breath. No urinary complaints.    Physical Exam  Wt Readings from Last 3 Encounters:  01/30/22 215 lb 6.4 oz (97.7 kg)  02/26/21 237 lb (107.5 kg)  01/25/21 242 lb (109.8 kg)    BP (!) 140/50   Pulse 61   Ht 5\' 9"  (1.753 m)   Wt 220 lb (99.8 kg)   BMI 32.49 kg/m  Constitutional:  Pleasant, generally well appearing male in no acute distress. Psychiatric: Normal mood and affect. Behavior is normal. EENT: Pupils normal.  Conjunctivae are normal. No scleral icterus. Neck supple.  Cardiovascular: Normal rate, regular rhythm.  Pulmonary/chest: Effort normal and breath sounds normal. No wheezing, rales or rhonchi. Abdominal: Soft, nondistended, nontender. Bowel sounds active throughout. There are no masses palpable. No hepatomegaly. Neurological: Alert and oriented to person place and time.  Extremities: 1+ BLE edema Skin: Skin is warm and  dry. No rashes noted.  Willette ClusterPaula Jamontae Thwaites, NP  05/22/2022, 3:23 PM

## 2022-05-23 ENCOUNTER — Encounter: Payer: Self-pay | Admitting: Nurse Practitioner

## 2022-05-23 ENCOUNTER — Telehealth: Payer: Self-pay

## 2022-05-23 ENCOUNTER — Ambulatory Visit: Payer: Medicare Other | Admitting: Nurse Practitioner

## 2022-05-23 DIAGNOSIS — Z7901 Long term (current) use of anticoagulants: Secondary | ICD-10-CM | POA: Diagnosis not present

## 2022-05-23 DIAGNOSIS — Z86010 Personal history of colonic polyps: Secondary | ICD-10-CM

## 2022-05-23 DIAGNOSIS — N189 Chronic kidney disease, unspecified: Secondary | ICD-10-CM | POA: Diagnosis not present

## 2022-05-23 DIAGNOSIS — Z800 Family history of malignant neoplasm of digestive organs: Secondary | ICD-10-CM

## 2022-05-23 MED ORDER — NA SULFATE-K SULFATE-MG SULF 17.5-3.13-1.6 GM/177ML PO SOLN: 1.0000 | ORAL | 0 refills | Status: DC

## 2022-05-23 NOTE — Telephone Encounter (Signed)
   Name: Carlos Moreno  DOB: February 16, 1945  MRN: [id number]  Primary Cardiologist: Rollene Rotunda, MD  Chart reviewed as part of pre-operative protocol coverage. Because of Stokes Rettinger's past medical history and time since last visit, he will require a follow-up in-office visit in order to better assess preoperative cardiovascular risk.  Pre-op covering staff: - Please schedule appointment and call patient to inform them. If patient already had an upcoming appointment within acceptable timeframe, please add "pre-op clearance" to the appointment notes so provider is aware. - Please contact requesting surgeon's office via preferred method (i.e, phone, fax) to inform them of need for appointment prior to surgery.  This message will also be routed to pharmacy pool for input on holding Eliquis as requested below so that this information is available to the clearing provider at time of patient's appointment.   Carlos Levering, NP  05/23/2022, 1:21 PM

## 2022-05-23 NOTE — Telephone Encounter (Signed)
[facility] Pre-operative Risk Assessment     Request for surgical clearance:     Endoscopy Procedure  What type of surgery is being performed?     Colonoscopy  When is this surgery scheduled?     06-14-22  What type of clearance is required ?   Pharmacy  Are there any medications that need to be held prior to surgery and how long? Eliquis X 3 days  Practice name and name of physician performing surgery?      [facility]  What is your office phone and fax number?      Phone- [phone]  Fax- [phone]  Anesthesia type (None, local, MAC, general) ?       MAC

## 2022-05-23 NOTE — Telephone Encounter (Signed)
Patient with diagnosis of afib on Eliquis for anticoagulation.    Procedure: colonoscopy Date of procedure: 06/14/22  CHA2DS2-VASc Score = 5  This indicates a 7.2% annual risk of stroke. The patient's score is based upon: CHF History: 0 HTN History: 1 Diabetes History: 1 Stroke History: 0 Vascular Disease History: 1 Age Score: 2 Gender Score: 0  CrCl 40mL/min Platelet count 179K  Shouldn't need 3 day DOAC hold for colonoscopy. Per office protocol, patient can hold Eliquis for 2 days prior to procedure.    **This guidance is not considered finalized until pre-operative APP has relayed final recommendations.**

## 2022-05-23 NOTE — Patient Instructions (Addendum)
_______________________________________________________  If your blood pressure at your visit was 140/90 or greater, please contact your primary care physician to follow up on this. _______________________________________________________  If you are age 77 or older, your body mass index should be between 23-30. Your Body mass index is 32.49 kg/m. If this is out of the aforementioned range listed, please consider follow up with your Primary Care Provider. ________________________________________________________  The [facility] providers would like to encourage you to use [facility] to communicate with providers for non-urgent requests or questions.  Due to long hold times on the telephone, sending your provider a message by [facility] may be a faster and more efficient way to get a response.  Please allow 48 business hours for a response.  Please remember that this is for non-urgent requests.  _______________________________________________________  Bonita Quin have been scheduled for a colonoscopy. Please follow written instructions given to you at your visit today.  Please pick up your prep supplies at the pharmacy within the next 1-3 days. If you use inhalers (even only as needed), please bring them with you on the day of your procedure.  Due to recent changes in healthcare laws, you may see the results of your imaging and laboratory studies on MyChart before your provider has had a chance to review them.  We understand that in some cases there may be results that are confusing or concerning to you. Not all laboratory results come back in the same time frame and the provider may be waiting for multiple results in order to interpret others.  Please give [place] 48 hours in order for your provider to thoroughly review all the results before contacting the office for clarification of your results.   You will be contacted by our office prior to your procedure for directions on holding your Eliquis.  If you do not hear  from our office 1 week prior to your scheduled procedure, please call [phone] to discuss.  Thank you for entrusting me with your care and choosing [facility].  Willette Cluster, NP

## 2022-05-23 NOTE — Telephone Encounter (Signed)
Pt has been scheduled for in office appt 05/30/22 @ 2:45 pre op clearance needed. Pt thanked me for the help and the appt. I willl update all parties involved.

## 2022-05-28 NOTE — Progress Notes (Signed)
Reviewed and agree with documentation and assessment and plan. K. Veena Hodge Stachnik , MD   

## 2022-05-29 NOTE — Progress Notes (Unsigned)
Cardiology Office Note:    Date:  05/30/2022   ID:  Carlos Moreno, DOB [birth year], MRN [id number]  PCP:  Carlos Ran, MD   [facility] Providers Cardiologist:  Carlos Rotunda, MD { Referring MD: Carlos Ran, MD   Chief Complaint  Patient presents with   Follow-up   Pre-op Exam    History of Present Illness:    Carlos Moreno is a 77 y.o. male with a hx of atrial flutter on Eliquis, remote stenting for CAD in 2005, 2007, and DES-mid LAD in 2017, OSA on CPAP, CKD stage III, DM 2, hypertension, hyperlipidemia, and obesity.  He was hospitalized with atrial flutter 06/2019.  He was anticoagulated and underwent TEE guided cardioversion 07/29/19.  Aspirin was discontinued for prior history of GI bleed. He is anticoagulated with eliquis. He was last seen in clinic by Dr. Antoine Moreno on 02/26/2021 and was sinus bradycardia HR 49  He presents today for preoperative risk evaluation prior to colonoscopy scheduled on 06/14/2022.  BP trend: 143/59 135/58  Just started 1 mg cardura. Will continue monitoring.   He follows with nephrology who is monitoring his renal function and potassium. No issues with bleeding. He walks [employer] and grocery store, he is able to do moderate housework without angina. He just got back from a weeks long vacation in [place]. He walked there more than normal and had no angina or SOB. No cardiac complaints today.   Past Medical History:  Diagnosis Date   Anemia    Arthritis    Atrial flutter with rapid ventricular response 07/27/2019   Basal cell carcinoma    CAD (coronary artery disease) 2005    PCI to mid LAD with a 3.0 x 15 mm Resolute Integrity DES May 2017. LAD Stent 2005 (Details not available)   Cataract    CKD stage 3 due to type 2 diabetes mellitus 07/27/2019   Curvature of spine    Diabetes mellitus type 2, diet-controlled 07/27/2019   Gallstones    Hypertension    Kidney stones    Macular degeneration    Obesity    OSA (obstructive sleep  apnea)    CPAP    Past Surgical History:  Procedure Laterality Date   ABDOMINOPLASTY  02/2009   CARDIOVERSION N/A 07/29/2019   Procedure: CARDIOVERSION;  Surgeon: Little Ishikawa, MD;  Location: [facility] ENDOSCOPY;  Service: Cardiovascular;  Laterality: N/A;   CATARACT EXTRACTION, BILATERAL  03/2007   CHOLECYSTECTOMY  2001   COLONOSCOPY  08/07 11/12 04/16    CORONARY ANGIOPLASTY WITH STENT PLACEMENT  07/2003   Stent LAD, 06/2015 DES LAD   HIP SURGERY Left    INGUINAL HERNIA REPAIR Left 1956   KNEE ARTHROSCOPY Right 05/2005   x3   LAMINECTOMY  2009   Lumbar   REPLACEMENT TOTAL KNEE Left 1994   REPLACEMENT TOTAL KNEE Right    x 3   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 ENDOSCOPY;  Service: Cardiovascular;  Laterality: N/A;   TOTAL HIP ARTHROPLASTY Right 01/2007    Current Medications: Current Meds  Medication Sig   acetaminophen (TYLENOL) 500 MG tablet Take 500 mg by mouth every 6 (six) hours as needed for mild pain.   apixaban (ELIQUIS) 5 MG TABS tablet Take 1 tablet (5 mg total) by mouth 2 (two) times daily.   atorvastatin (LIPITOR) 40 MG tablet Take 40 mg by mouth daily.  b complex vitamins capsule Take 1 capsule by mouth every other day.    Calcium-Phosphorus-Vitamin D (CALCIUM GUMMIES PO) Take 2 tablets by mouth daily.   cetirizine (ZYRTEC) 10 MG tablet Take 10 mg by mouth daily as needed for allergies.   Cholecalciferol (VITAMIN D) 125 MCG (5000 UT) CAPS Take 5,000 Units by mouth daily.   doxazosin (CARDURA) 1 MG tablet Take 1 mg by mouth daily.   famotidine (PEPCID) 20 MG tablet TAKE ONE TABLET BY MOUTH TWICE A DAY   fluticasone (FLONASE) 50 MCG/ACT nasal spray Place 1 spray into both nostrils daily as needed for allergies or rhinitis.   HYDROcodone-acetaminophen (NORCO/VICODIN) 5-325 MG tablet Take 1 tablet by mouth every 6  (six) hours as needed for moderate pain.   isosorbide mononitrate (IMDUR) 30 MG 24 hr tablet Take 30 mg by mouth daily.   JARDIANCE 25 MG TABS tablet Take 1 tablet (25 mg total) by mouth daily. Do not restart until you follow up with your PCP   losartan (COZAAR) 25 MG tablet Take 25 mg by mouth daily.   Melatonin ER 5 MG TBCR Take 5 mg by mouth at bedtime as needed (sleep).   Multiple Vitamins-Minerals (VISION FORMULA/LUTEIN) TABS Take 1 tablet by mouth daily.    Na Sulfate-K Sulfate-Mg Sulf (SUPREP BOWEL PREP KIT) 17.5-3.13-1.6 GM/177ML SOLN Take 1 kit by mouth as directed.   nebivolol (BYSTOLIC) 10 MG tablet Take 10 mg by mouth daily.   Polysaccharide Iron Complex (IRON UP PO) Take 120 mg by mouth daily.   tiZANidine (ZANAFLEX) 4 MG tablet Take 4 mg by mouth at bedtime.   TRADJENTA 5 MG TABS tablet Take 5 mg by mouth daily.    traMADol (ULTRAM) 50 MG tablet Take 50 mg by mouth every 6 (six) hours as needed for moderate pain.    vitamin C (ASCORBIC ACID) 500 MG tablet Take 500 mg by mouth daily.   zinc gluconate 50 MG tablet Take 50 mg by mouth daily.      Allergies:   Patient has no known allergies.   Social History   Socioeconomic History   Marital status: Married    Spouse name: Not on file   Number of children: 3   Years of education: Not on file   Highest education level: Not on file  Occupational History   Occupation: Retired Charity fundraiser  Tobacco Use   Smoking status: Former    Types: Pipe   Smokeless tobacco: Never   Tobacco comments:    Smoked a pipe around 50-8 years old  Psychologist, educational Use   Vaping Use: Never used  Substance and Sexual Activity   Alcohol use: Yes    Comment: highball and wine daily   Drug use: Never   Sexual activity: Not on file  Other Topics Concern   Not on file  Social History Narrative   Not on file   Social Determinants of Health   Financial Resource Strain: Not on file  Food Insecurity: Not on file  Transportation Needs: Not on file  Physical  Activity: Not on file  Stress: Not on file  Social Connections: Not on file     Family History: The patient's family history includes Bladder Cancer in his mother; Colon cancer (age of onset: 7) in his father; Diabetes in his paternal grandmother; Rheum arthritis in his mother; Tuberculosis in his paternal grandfather. There is no history of Esophageal cancer or Rectal cancer.  ROS:   Please see the history of present illness.  All other systems reviewed and are negative.  EKGs/Labs/Other Studies Reviewed:    The following studies were reviewed today:  Echo 2021: 1. Technically difficult study due to poor sound wave transmission.   2. Left ventricular ejection fraction, by estimation, is 55 to 60%. The  left ventricle has normal function. The left ventricle has no regional  wall motion abnormalities. There is mild left ventricular hypertrophy.  Left ventricular diastolic parameters  were normal.   3. Right ventricular systolic function is normal. The right ventricular  size is normal. There is normal pulmonary artery systolic pressure.   4. Left atrial size was moderately dilated.   5. The mitral valve is normal in structure. Trivial mitral valve  regurgitation.   6. The aortic valve is normal in structure. Aortic valve regurgitation is  trivial.   7. Aortic dilatation noted. 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   EKG:  EKG is  ordered today.  The ekg ordered today demonstrates sinus bradycardia HR 50  Recent Labs: 01/30/2022: ALT 38; BUN 39; Creatinine, Ser 1.99; Hemoglobin 11.6; Platelets 179; Potassium 5.2; Sodium 142  Recent Lipid Panel No results found for: "CHOL", "TRIG", "HDL", "CHOLHDL", "VLDL", "LDLCALC", "LDLDIRECT"   Risk Assessment/Calculations:    CHA2DS2-VASc Score = 5   This indicates a 7.2% annual risk of stroke. The patient's score is based upon: CHF History: 0 HTN History: 1 Diabetes History: 1 Stroke History:  0 Vascular Disease History: 1 Age Score: 2 Gender Score: 0             Physical Exam:    VS:  BP 138/68   Pulse (!) 50   Ht 5\' 9"  (1.753 m)   Wt 220 lb (99.8 kg)   SpO2 99%   BMI 32.49 kg/m     Wt Readings from Last 3 Encounters:  05/30/22 220 lb (99.8 kg)  05/23/22 220 lb (99.8 kg)  01/30/22 215 lb 6.4 oz (97.7 kg)     GEN:  Well nourished, well developed in no acute distress HEENT: Normal NECK: No JVD; No carotid bruits LYMPHATICS: No lymphadenopathy CARDIAC: RRR, 2/6 heart murmur RESPIRATORY:  Clear to auscultation without rales, wheezing or rhonchi  ABDOMEN: Soft, non-tender, non-distended MUSCULOSKELETAL:  No edema; No deformity  SKIN: Warm and dry NEUROLOGIC:  Alert and oriented x 3 PSYCHIATRIC:  Normal affect   ASSESSMENT:    1. Cardiac murmur   2. Atrial flutter, unspecified type   3. Chronic anticoagulation   4. Hyperlipidemia with target LDL less than 70   5. Primary hypertension   6. Diabetes mellitus type 2, diet-controlled   7. CKD stage 3 due to type 2 diabetes mellitus   8. Preoperative clearance    PLAN:    In order of problems listed above:  Paroxysmal atrial flutter Has done well since TEE-DCCV in 2021 Sinus bradycardia today   Chronic anticoagulation No bleeding issues on eliquis Appropriately dosed   CAD with prior PCI, most recent DES-mLAD in 2017 No ASA given eliquis and prior GI bleed   Hyperlipidemia with LDL goal < 55 Given DM Nees updated lipid panel Maintained on 40 mg lipitor   Hypertension 1mg  doxazosin daily, 30 mg imdur, 25 mg losartan, 10 mg bystolic Doxazosin was just added less than 1 month ago - he shows me the trend from his BP machine, BP is trending down - continue to monitor   Heart murmur 2/6 systolic heart murmur on exam No SOB,  syncope, chest pain Will repeat an echo - I do not think this needs to be done prior to colonoscopy   DM2 Jardiance A1c 6.2% 01/2022   CKD stage III I encouraged  him to see nephrology - question need to stop losartan.   Preoperative risk evaluation for Mace According to the RCRI, he has 0.9% risk of Mace for ischemic heart disease.  He does have a fluctuating creatinine, but last labs collected showed a serum creatinine under 2. He reports activity equivalent to greater than 4.0 METS without angina. He does not have any unstable cardiac issues.  He may proceed with colonoscopy without further cardiac testing.  Per our clinical Pharm.D: Shouldn't need 3 day DOAC hold for colonoscopy. Per office protocol, patient can hold Eliquis for 2 days prior to procedure.    Follow up in 1 year.   Medication Adjustments/Labs and Tests Ordered: Current medicines are reviewed at length with the patient today.  Concerns regarding medicines are outlined above.  Orders Placed This Encounter  Procedures   EKG 12-Lead   ECHOCARDIOGRAM COMPLETE   No orders of the defined types were placed in this encounter.   Patient Instructions  Medication Instructions:  No Changes *If you need a refill on your cardiac medications before your next appointment, please call your pharmacy*   Lab Work: No Labs If you have labs (blood work) drawn today and your tests are completely normal, you will receive your results only by: MyChart Message (if you have MyChart) OR A paper copy in the mail If you have any lab test that is abnormal or we need to change your treatment, we will call you to review the results.   Testing/Procedures: [address]. Your physician has requested that you have an echocardiogram. Echocardiography is a painless test that uses sound waves to create images of your heart. It provides your doctor with information about the size and shape of your heart and how well your heart's chambers and valves are working. This procedure takes approximately one hour. There are no restrictions for this procedure. Please do NOT wear cologne, perfume,  aftershave, or lotions (deodorant is allowed). Please arrive 15 minutes prior to your appointment time.    Follow-Up: At [facility], you and your health needs are our priority.  As part of our continuing mission to provide you with exceptional heart care, we have created designated Provider Care Teams.  These Care Teams include your primary Cardiologist (physician) and Advanced Practice Providers (APPs -  Physician Assistants and Nurse Practitioners) who all work together to provide you with the care you need, when you need it.  We recommend signing up for the patient portal called "MyChart".  Sign up information is provided on this After Visit Summary.  MyChart is used to connect with patients for Virtual Visits (Telemedicine).  Patients are able to view lab/test results, encounter notes, upcoming appointments, etc.  Non-urgent messages can be sent to your provider as well.   To learn more about what you can do with MyChart, go to [email or web address].    Your next appointment:   1 year(s)  Provider:   Rollene Rotunda, MD    Signed, Roe Rutherford Tyreck Bell, [place]  05/30/2022 4:03 PM    [facility]

## 2022-05-30 ENCOUNTER — Ambulatory Visit: Payer: Medicare Other | Attending: Physician Assistant | Admitting: Physician Assistant

## 2022-05-30 ENCOUNTER — Encounter: Payer: Self-pay | Admitting: Physician Assistant

## 2022-05-30 VITALS — BP 138/68 | HR 50 | Ht 69.0 in | Wt 220.0 lb

## 2022-05-30 DIAGNOSIS — Z7901 Long term (current) use of anticoagulants: Secondary | ICD-10-CM | POA: Diagnosis not present

## 2022-05-30 DIAGNOSIS — R011 Cardiac murmur, unspecified: Secondary | ICD-10-CM

## 2022-05-30 DIAGNOSIS — N183 Chronic kidney disease, stage 3 unspecified: Secondary | ICD-10-CM

## 2022-05-30 DIAGNOSIS — I10 Essential (primary) hypertension: Secondary | ICD-10-CM

## 2022-05-30 DIAGNOSIS — I4892 Unspecified atrial flutter: Secondary | ICD-10-CM | POA: Diagnosis not present

## 2022-05-30 DIAGNOSIS — E785 Hyperlipidemia, unspecified: Secondary | ICD-10-CM | POA: Diagnosis not present

## 2022-05-30 DIAGNOSIS — Z01818 Encounter for other preprocedural examination: Secondary | ICD-10-CM

## 2022-05-30 DIAGNOSIS — E119 Type 2 diabetes mellitus without complications: Secondary | ICD-10-CM

## 2022-05-30 DIAGNOSIS — E1122 Type 2 diabetes mellitus with diabetic chronic kidney disease: Secondary | ICD-10-CM

## 2022-05-30 NOTE — Patient Instructions (Signed)
Medication Instructions:  No Changes *If you need a refill on your cardiac medications before your next appointment, please call your pharmacy*   Lab Work: No Labs If you have labs (blood work) drawn today and your tests are completely normal, you will receive your results only by: MyChart Message (if you have MyChart) OR A paper copy in the mail If you have any lab test that is abnormal or we need to change your treatment, we will call you to review the results.   Testing/Procedures: [address], [address]. Your physician has requested that you have an echocardiogram. Echocardiography is a painless test that uses sound waves to create images of your heart. It provides your doctor with information about the size and shape of your heart and how well your heart's chambers and valves are working. This procedure takes approximately one hour. There are no restrictions for this procedure. Please do NOT wear cologne, perfume, aftershave, or lotions (deodorant is allowed). Please arrive 15 minutes prior to your appointment time.    Follow-Up: At [facility], you and your health needs are our priority.  As part of our continuing mission to provide you with exceptional heart care, we have created designated Provider Care Teams.  These Care Teams include your primary Cardiologist (physician) and Advanced Practice Providers (APPs -  Physician Assistants and Nurse Practitioners) who all work together to provide you with the care you need, when you need it.  We recommend signing up for the patient portal called "MyChart".  Sign up information is provided on this After Visit Summary.  MyChart is used to connect with patients for Virtual Visits (Telemedicine).  Patients are able to view lab/test results, encounter notes, upcoming appointments, etc.  Non-urgent messages can be sent to your provider as well.   To learn more about what you can do with MyChart, go to  [email or web address].    Your next appointment:   1 year(s)  Provider:   Rollene Rotunda, MD

## 2022-06-06 NOTE — Telephone Encounter (Signed)
Patient advised that he has been given clearance to hold Eliquis 2 days prior to colonoscopy scheduled for 06-14-22.  Patient advised to take last dose of Eliquis on 06-11-22, and he will be advised when to restart Eliquis by Dr Lavon Paganini after the procedure.  Patient agreed to plan and verbalized understanding.  No further questions.

## 2022-06-14 ENCOUNTER — Encounter: Payer: Self-pay | Admitting: Gastroenterology

## 2022-06-14 ENCOUNTER — Ambulatory Visit (AMBULATORY_SURGERY_CENTER): Payer: Medicare Other | Admitting: Gastroenterology

## 2022-06-14 VITALS — BP 104/56 | HR 42 | Temp 97.8°F | Resp 12 | Ht 69.0 in | Wt 220.0 lb

## 2022-06-14 DIAGNOSIS — Z09 Encounter for follow-up examination after completed treatment for conditions other than malignant neoplasm: Secondary | ICD-10-CM | POA: Diagnosis not present

## 2022-06-14 DIAGNOSIS — K5730 Diverticulosis of large intestine without perforation or abscess without bleeding: Secondary | ICD-10-CM | POA: Diagnosis not present

## 2022-06-14 DIAGNOSIS — Z86010 Personal history of colonic polyps: Secondary | ICD-10-CM

## 2022-06-14 DIAGNOSIS — Z800 Family history of malignant neoplasm of digestive organs: Secondary | ICD-10-CM

## 2022-06-14 DIAGNOSIS — D123 Benign neoplasm of transverse colon: Secondary | ICD-10-CM | POA: Diagnosis not present

## 2022-06-14 MED ORDER — SODIUM CHLORIDE 0.9 % IV SOLN: 500.0000 mL | Freq: Once | INTRAVENOUS | Status: DC

## 2022-06-14 NOTE — Progress Notes (Unsigned)
[facility] History and Physical   Primary Care Physician:  Rodrigo Ran, MD   Reason for Procedure:  History of adenomatous colon polyps  Plan:    Surveillance colonoscopy with possible interventions as needed     HPI: Carlos Moreno is a very pleasant 77 y.o. male here for surveillance colonoscopy. Denies any nausea, vomiting, abdominal pain, melena or bright red blood per rectum  The risks and benefits as well as alternatives of endoscopic procedure(s) have been discussed and reviewed. All questions answered. The patient agrees to proceed.    Past Medical History:  Diagnosis Date   Anemia    Arthritis    Atrial flutter with rapid ventricular response (HCC) 07/27/2019   Basal cell carcinoma    CAD (coronary artery disease) 2005    PCI to mid LAD with a 3.0 x 15 mm Resolute Integrity DES May 2017. LAD Stent 2005 (Details not available)   Cataract    CKD stage 3 due to type 2 diabetes mellitus (HCC) 07/27/2019   Curvature of spine    Diabetes mellitus type 2, diet-controlled (HCC) 07/27/2019   Gallstones    Hypertension    Kidney stones    Macular degeneration    Obesity    OSA (obstructive sleep apnea)    CPAP    Past Surgical History:  Procedure Laterality Date   ABDOMINOPLASTY  02/2009   CARDIOVERSION N/A 07/29/2019   Procedure: CARDIOVERSION;  Surgeon: Little Ishikawa, MD;  Location: [facility] ENDOSCOPY;  Service: Cardiovascular;  Laterality: N/A;   CATARACT EXTRACTION, BILATERAL  03/2007   CHOLECYSTECTOMY  2001   COLONOSCOPY  08/07 11/12 04/16    CORONARY ANGIOPLASTY WITH STENT PLACEMENT  07/2003   Stent LAD, 06/2015 DES LAD   HIP SURGERY Left    INGUINAL HERNIA REPAIR Left 1956   KNEE ARTHROSCOPY Right 05/2005   x3   LAMINECTOMY  2009   Lumbar   REPLACEMENT TOTAL KNEE Left 1994   REPLACEMENT TOTAL KNEE Right    x 3   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 Authorizing Provider  acetaminophen (TYLENOL) 500 MG tablet Take 500 mg by mouth every 6 (six) hours as needed for mild pain.   Yes [provider]  atorvastatin (LIPITOR) 40 MG tablet Take 40 mg by mouth daily. 12/19/16  Yes [provider]  b complex vitamins capsule Take 1 capsule by mouth every other day.    Yes [provider]  Calcium-Phosphorus-Vitamin D (CALCIUM GUMMIES PO) Take 2 tablets by mouth daily.   Yes [provider]  cetirizine (ZYRTEC) 10 MG tablet Take 10 mg by mouth daily as needed for allergies.   Yes [provider]  Cholecalciferol (VITAMIN D) 125 MCG (5000 UT) CAPS Take 5,000 Units by mouth daily.   Yes [provider]  doxazosin (CARDURA) 1 MG tablet Take 1 mg by mouth daily.   Yes [provider]  famotidine (PEPCID) 20 MG tablet TAKE ONE TABLET BY MOUTH TWICE A DAY 03/13/21  Yes Jaken Fregia, Eleonore Chiquito, MD  fluticasone (FLONASE) 50 MCG/ACT nasal spray Place 1 spray into both nostrils daily as needed for allergies or rhinitis.   Yes [provider]  isosorbide mononitrate (IMDUR)  30 MG 24 hr tablet Take 30 mg by mouth daily. 12/19/16  Yes [provider]  JARDIANCE 25 MG TABS tablet Take 1 tablet (25 mg total) by mouth daily. Do not restart until you follow up with your PCP 07/29/19  Yes Jodelle Red, MD  losartan (COZAAR) 25 MG tablet Take 25 mg by mouth daily. 11/03/19  Yes [provider]  Melatonin ER 5 MG TBCR Take 5 mg by mouth at bedtime as needed (sleep).   Yes [provider]  Multiple Vitamins-Minerals (VISION FORMULA/LUTEIN) TABS Take 1 tablet by mouth daily.  10/10/14  Yes [provider]  nebivolol  (BYSTOLIC) 10 MG tablet Take 10 mg by mouth daily.   Yes [provider]  tiZANidine (ZANAFLEX) 4 MG tablet Take 4 mg by mouth at bedtime. 03/30/19  Yes [provider]  TRADJENTA 5 MG TABS tablet Take 5 mg by mouth daily.  06/08/18  Yes [provider]  traMADol (ULTRAM) 50 MG tablet Take 50 mg by mouth every 6 (six) hours as needed for moderate pain.    Yes [provider]  vitamin C (ASCORBIC ACID) 500 MG tablet Take 500 mg by mouth daily.   Yes [provider]  zinc gluconate 50 MG tablet Take 50 mg by mouth daily.  03/17/14  Yes [provider]  apixaban (ELIQUIS) 5 MG TABS tablet Take 1 tablet (5 mg total) by mouth 2 (two) times daily. 02/26/21   Rollene Rotunda, MD  HYDROcodone-acetaminophen (NORCO/VICODIN) 5-325 MG tablet Take 1 tablet by mouth every 6 (six) hours as needed for moderate pain. 01/25/21   Vanetta Mulders, MD  nitroGLYCERIN (NITROSTAT) 0.4 MG SL tablet Place 1 tablet (0.4 mg total) under the tongue every 5 (five) minutes as needed for chest pain. Patient not taking: Reported on 06/14/2022 02/26/21 12/27/21  Rollene Rotunda, MD  Polysaccharide Iron Complex (IRON UP PO) Take 120 mg by mouth daily.    [provider]    Current Outpatient Medications  Medication Sig Dispense Refill   acetaminophen (TYLENOL) 500 MG tablet Take 500 mg by mouth every 6 (six) hours as needed for mild pain.     atorvastatin (LIPITOR) 40 MG tablet Take 40 mg by mouth daily.  3   b complex vitamins capsule Take 1 capsule by mouth every other day.      Calcium-Phosphorus-Vitamin D (CALCIUM GUMMIES PO) Take 2 tablets by mouth daily.     cetirizine (ZYRTEC) 10 MG tablet Take 10 mg by mouth daily as needed for allergies.     Cholecalciferol (VITAMIN D) 125 MCG (5000 UT) CAPS Take 5,000 Units by mouth daily.     doxazosin (CARDURA) 1 MG tablet Take 1 mg by mouth daily.     famotidine (PEPCID) 20 MG tablet TAKE ONE TABLET BY MOUTH TWICE A DAY 180  tablet 3   fluticasone (FLONASE) 50 MCG/ACT nasal spray Place 1 spray into both nostrils daily as needed for allergies or rhinitis.     isosorbide mononitrate (IMDUR) 30 MG 24 hr tablet Take 30 mg by mouth daily.  3   JARDIANCE 25 MG TABS tablet Take 1 tablet (25 mg total) by mouth daily. Do not restart until you follow up with your PCP 30 tablet    losartan (COZAAR) 25 MG tablet Take 25 mg by mouth daily.     Melatonin ER 5 MG TBCR Take 5 mg by mouth at bedtime as needed (sleep).     Multiple Vitamins-Minerals (VISION FORMULA/LUTEIN) TABS  Take 1 tablet by mouth daily.      nebivolol (BYSTOLIC) 10 MG tablet Take 10 mg by mouth daily.     tiZANidine (ZANAFLEX) 4 MG tablet Take 4 mg by mouth at bedtime.     TRADJENTA 5 MG TABS tablet Take 5 mg by mouth daily.      traMADol (ULTRAM) 50 MG tablet Take 50 mg by mouth every 6 (six) hours as needed for moderate pain.      vitamin C (ASCORBIC ACID) 500 MG tablet Take 500 mg by mouth daily.     zinc gluconate 50 MG tablet Take 50 mg by mouth daily.      apixaban (ELIQUIS) 5 MG TABS tablet Take 1 tablet (5 mg total) by mouth 2 (two) times daily. 180 tablet 1   HYDROcodone-acetaminophen (NORCO/VICODIN) 5-325 MG tablet Take 1 tablet by mouth every 6 (six) hours as needed for moderate pain. 20 tablet 0   nitroGLYCERIN (NITROSTAT) 0.4 MG SL tablet Place 1 tablet (0.4 mg total) under the tongue every 5 (five) minutes as needed for chest pain. (Patient not taking: Reported on 06/14/2022) 25 tablet 3   Polysaccharide Iron Complex (IRON UP PO) Take 120 mg by mouth daily.     Current Facility-Administered Medications  Medication Dose Route Frequency Provider Last Rate Last Admin   0.9 %  sodium chloride infusion  500 mL Intravenous Once Napoleon Form, MD        Allergies as of 06/14/2022   (No Known Allergies)    Family History  Problem Relation Age of Onset   Bladder Cancer Mother    Rheum arthritis Mother    Colon cancer Father 53   Diabetes  Paternal Grandmother    Tuberculosis Paternal Grandfather    Esophageal cancer Neg Hx    Rectal cancer Neg Hx     Social History   Socioeconomic History   Marital status: Married    Spouse name: Not on file   Number of children: 3   Years of education: Not on file   Highest education level: Not on file  Occupational History   Occupation: Retired Charity fundraiser  Tobacco Use   Smoking status: Former    Types: Pipe   Smokeless tobacco: Never   Tobacco comments:    Smoked a pipe around 13-35 years old  Psychologist, educational Use   Vaping Use: Never used  Substance and Sexual Activity   Alcohol use: Yes    Comment: highball and wine daily   Drug use: Never   Sexual activity: Not on file  Other Topics Concern   Not on file  Social History Narrative   Not on file   Social Determinants of Health   Financial Resource Strain: Not on file  Food Insecurity: Not on file  Transportation Needs: Not on file  Physical Activity: Not on file  Stress: Not on file  Social Connections: Not on file  Intimate Partner Violence: Not on file    Review of Systems:  All other review of systems negative except as mentioned in the HPI.  Physical Exam: Vital signs in last 24 hours: Blood Pressure (Abnormal) 135/57   Pulse (Abnormal) 50   Temperature 97.8 F (36.6 C) (Skin)   Height 5\' 9"  (1.753 m)   Weight 220 lb (99.8 kg)   Oxygen Saturation 99%   Body Mass Index 32.49 kg/m  General:   Alert, NAD Lungs:  Clear .   Heart:  Regular rate and rhythm Abdomen:  Soft, nontender and  nondistended. Neuro/Psych:  Alert and cooperative. Normal mood and affect. A and O x 3  Reviewed labs, radiology imaging, old records and pertinent past GI work up  Patient is appropriate for planned procedure(s) and anesthesia in an ambulatory setting   K. Scherry Ran , MD [phone]

## 2022-06-14 NOTE — Progress Notes (Signed)
Called to room to assist during endoscopic procedure.  Patient ID and intended procedure confirmed with present staff. Received instructions for my participation in the procedure from the performing physician.  

## 2022-06-14 NOTE — Progress Notes (Unsigned)
Pt's states no medical or surgical changes since previsit or office visit. 

## 2022-06-14 NOTE — Progress Notes (Unsigned)
Uneventful anesthetic. Report to pacu rn. Vss. Care resumed by rn. 

## 2022-06-14 NOTE — Op Note (Addendum)
[facility] Patient Name: Carlos Moreno Procedure Date: 06/14/2022 2:55 PM MRN: [id number] Endoscopist: Napoleon Form , MD, [id number] Age: 77 Referring MD:  Date of Birth: Feb 23, 1945 Gender: Male Account #: [id number] Procedure:                Colonoscopy Indications:              Screening in patient at increased risk: Family                            history of 1st-degree relative with colorectal                            cancer, High risk colon cancer surveillance:                            Personal history of colonic polyps, High risk colon                            cancer surveillance: Personal history of adenoma                            (10 mm or greater in size), High risk colon cancer                            surveillance: Personal history of multiple (3 or                            more) adenomas Medicines:                Monitored Anesthesia Care Procedure:                Pre-Anesthesia Assessment:                           - Prior to the procedure, a History and Physical                            was performed, and patient medications and                            allergies were reviewed. The patient's tolerance of                            previous anesthesia was also reviewed. The risks                            and benefits of the procedure and the sedation                            options and risks were discussed with the patient.                            All questions were answered, and informed consent  was obtained. Prior Anticoagulants: The patient                            last took Eliquis (apixaban) 2 days prior to the                            procedure. ASA Grade Assessment: III - A patient                            with severe systemic disease. After reviewing the                            risks and benefits, the patient was deemed in                            satisfactory condition to undergo  the procedure.                           After obtaining informed consent, the colonoscope                            was passed under direct vision. Throughout the                            procedure, the patient's blood pressure, pulse, and                            oxygen saturations were monitored continuously. The                            Olympus PCF-[id number] (#[id number]) Colonoscope was                            introduced through the anus and advanced to the the                            cecum, identified by appendiceal orifice and                            ileocecal valve. The colonoscopy was performed                            without difficulty. The patient tolerated the                            procedure well. The quality of the bowel                            preparation was good. The ileocecal valve,                            appendiceal orifice, and rectum were photographed. Scope In: 3:01:28 PM Scope Out: 3:16:28 PM Scope Withdrawal Time: 0 hours 8 minutes 14 seconds  Total Procedure  Duration: 0 hours 15 minutes 0 seconds  Findings:                 The perianal and digital rectal examinations were                            normal.                           Two sessile polyps were found in the transverse                            colon. The polyps were 5 to 7 mm in size. These                            polyps were removed with a cold snare. Resection                            and retrieval were complete.                           Scattered large-mouthed, medium-mouthed and                            small-mouthed diverticula were found in the sigmoid                            colon, descending colon, transverse colon and                            ascending colon. There was evidence of an impacted                            diverticulum.                           Non-bleeding external and internal hemorrhoids were                            found during  retroflexion. The hemorrhoids were                            medium-sized. Complications:            No immediate complications. Estimated Blood Loss:     Estimated blood loss was minimal. Impression:               - Two 5 to 7 mm polyps in the transverse colon,                            removed with a cold snare. Resected and retrieved.                           - Moderate diverticulosis in the sigmoid colon, in  the descending colon, in the transverse colon and                            in the ascending colon. There was evidence of an                            impacted diverticulum.                           - Non-bleeding external and internal hemorrhoids. Recommendation:           - Patient has a contact number available for                            emergencies. The signs and symptoms of potential                            delayed complications were discussed with the                            patient. Return to normal activities tomorrow.                            Written discharge instructions were provided to the                            patient.                           - Resume previous diet.                           - Continue present medications.                           - Await pathology results.                           - No repeat colonoscopy due to age.                           - Resume Eliquis (apixaban) at prior dose tomorrow.                            Refer to managing physician for further adjustment                            of therapy. Napoleon Form, MD 06/14/2022 3:21:59 PM This report has been signed electronically.

## 2022-06-14 NOTE — Patient Instructions (Addendum)
Resume Eliquis as normal tomorrow, 06/15/22   YOU HAD AN ENDOSCOPIC PROCEDURE TODAY AT THE [facility]:   Refer to the procedure report that was given to you for any specific questions about what was found during the examination.  If the procedure report does not answer your questions, please call your gastroenterologist to clarify.  If you requested that your care partner not be given the details of your procedure findings, then the procedure report has been included in a sealed envelope for you to review at your convenience later.  YOU SHOULD EXPECT: Some feelings of bloating in the abdomen. Passage of more gas than usual.  Walking can help get rid of the air that was put into your GI tract during the procedure and reduce the bloating. If you had a lower endoscopy (such as a colonoscopy or flexible sigmoidoscopy) you may notice spotting of blood in your stool or on the toilet paper. If you underwent a bowel prep for your procedure, you may not have a normal bowel movement for a few days.  Please Note:  You might notice some irritation and congestion in your nose or some drainage.  This is from the oxygen used during your procedure.  There is no need for concern and it should clear up in a day or so.  SYMPTOMS TO REPORT IMMEDIATELY:  Following lower endoscopy (colonoscopy or flexible sigmoidoscopy):  Excessive amounts of blood in the stool  Significant tenderness or worsening of abdominal pains  Swelling of the abdomen that is new, acute  Fever of 100F or higher  For urgent or emergent issues, a gastroenterologist can be reached at any hour by calling [phone]. Do not use MyChart messaging for urgent concerns.    DIET:  We do recommend a small meal at first, but then you may proceed to your regular diet.  Drink plenty of fluids but you should avoid alcoholic beverages for 24 hours.  ACTIVITY:  You should plan to take it easy for the rest of today and you should NOT DRIVE or  use heavy machinery until tomorrow (because of the sedation medicines used during the test).    FOLLOW UP: Our staff will call the number listed on your records the next business day following your procedure.  We will call around 7:15- 8:00 am to check on you and address any questions or concerns that you may have regarding the information given to you following your procedure. If we do not reach you, we will leave a message.     If any biopsies were taken you will be contacted by phone or by letter within the next 1-3 weeks.  Please call us at [phone] if you have not heard about the biopsies in 3 weeks.    SIGNATURES/CONFIDENTIALITY: You and/or your care partner have signed paperwork which will be entered into your electronic medical record.  These signatures attest to the fact that that the information above on your After Visit Summary has been reviewed and is understood.  Full responsibility of the confidentiality of this discharge information lies with you and/or your care-partner.

## 2022-06-15 ENCOUNTER — Encounter: Payer: Self-pay | Admitting: Gastroenterology

## 2022-06-17 ENCOUNTER — Telehealth: Payer: Self-pay

## 2022-06-17 NOTE — Telephone Encounter (Signed)
  Follow up Call-     06/14/2022    2:11 PM  Call back number  Post procedure Call Back phone  # [phone]  Permission to leave phone message Yes     Patient questions:  Do you have a fever, pain , or abdominal swelling? No. Pain Score  0 *  Have you tolerated food without any problems? Yes.    Have you been able to return to your normal activities? Yes.    Do you have any questions about your discharge instructions: Diet   No. Medications  No. Follow up visit  No.  Do you have questions or concerns about your Care? No.  Actions: * If pain score is 4 or above: No action needed, pain <4.

## 2022-06-21 ENCOUNTER — Encounter: Payer: Self-pay | Admitting: Gastroenterology

## 2022-07-02 ENCOUNTER — Ambulatory Visit (HOSPITAL_COMMUNITY): Payer: Medicare Other | Attending: Cardiology

## 2022-07-02 DIAGNOSIS — Z7901 Long term (current) use of anticoagulants: Secondary | ICD-10-CM | POA: Diagnosis present

## 2022-07-02 DIAGNOSIS — E119 Type 2 diabetes mellitus without complications: Secondary | ICD-10-CM | POA: Insufficient documentation

## 2022-07-02 DIAGNOSIS — N183 Chronic kidney disease, stage 3 unspecified: Secondary | ICD-10-CM | POA: Diagnosis present

## 2022-07-02 DIAGNOSIS — I10 Essential (primary) hypertension: Secondary | ICD-10-CM | POA: Diagnosis present

## 2022-07-02 DIAGNOSIS — R011 Cardiac murmur, unspecified: Secondary | ICD-10-CM | POA: Diagnosis present

## 2022-07-02 DIAGNOSIS — Z01818 Encounter for other preprocedural examination: Secondary | ICD-10-CM | POA: Diagnosis present

## 2022-07-02 DIAGNOSIS — E1122 Type 2 diabetes mellitus with diabetic chronic kidney disease: Secondary | ICD-10-CM

## 2022-07-02 DIAGNOSIS — E785 Hyperlipidemia, unspecified: Secondary | ICD-10-CM | POA: Insufficient documentation

## 2022-07-02 DIAGNOSIS — I4892 Unspecified atrial flutter: Secondary | ICD-10-CM

## 2022-07-02 LAB — ECHOCARDIOGRAM COMPLETE
Area-P 1/2: 3.15 cm2
S' Lateral: 3.4 cm

## 2022-07-02 MED ORDER — PERFLUTREN LIPID MICROSPHERE
1.0000 mL | INTRAVENOUS | Status: AC | PRN
Start: 2022-07-02 — End: 2022-07-02
  Administered 2022-07-02: 2 mL via INTRAVENOUS

## 2022-07-04 ENCOUNTER — Other Ambulatory Visit: Payer: Self-pay | Admitting: *Deleted

## 2022-07-04 DIAGNOSIS — I071 Rheumatic tricuspid insufficiency: Secondary | ICD-10-CM

## 2023-06-18 ENCOUNTER — Ambulatory Visit (HOSPITAL_COMMUNITY): Attending: Physician Assistant

## 2023-06-18 DIAGNOSIS — I071 Rheumatic tricuspid insufficiency: Secondary | ICD-10-CM | POA: Insufficient documentation

## 2023-06-18 LAB — ECHOCARDIOGRAM COMPLETE
AR max vel: 2.45 cm2
AV Area VTI: 2.65 cm2
AV Area mean vel: 2.42 cm2
AV Mean grad: 7 mmHg
AV Peak grad: 12.5 mmHg
Ao pk vel: 1.77 m/s
Area-P 1/2: 2.84 cm2
S' Lateral: 2.5 cm

## 2023-08-06 LAB — LAB REPORT - SCANNED: Creatinine, POC: 49 mg/dL

## 2023-08-07 ENCOUNTER — Ambulatory Visit: Payer: Self-pay | Admitting: *Deleted

## 2024-02-10 ENCOUNTER — Ambulatory Visit: Admitting: Orthopedic Surgery

## 2024-02-10 ENCOUNTER — Encounter: Payer: Self-pay | Admitting: Orthopedic Surgery

## 2024-02-10 DIAGNOSIS — Z96651 Presence of right artificial knee joint: Secondary | ICD-10-CM | POA: Diagnosis not present

## 2024-02-10 DIAGNOSIS — Z96653 Presence of artificial knee joint, bilateral: Secondary | ICD-10-CM | POA: Diagnosis not present

## 2024-02-10 DIAGNOSIS — L97401 Non-pressure chronic ulcer of unspecified heel and midfoot limited to breakdown of skin: Secondary | ICD-10-CM | POA: Diagnosis not present

## 2024-02-10 MED ORDER — MUPIROCIN 2 % EX OINT: 1.0000 | TOPICAL_OINTMENT | Freq: Two times a day (BID) | CUTANEOUS | 3 refills | Status: AC

## 2024-02-10 NOTE — Progress Notes (Signed)
 "  Office Visit Note   Patient: Carlos Moreno           Date of Birth: Jul 04, 1945           MRN: [id number] Visit Date: 02/10/2024              Requested by: Shayne Anes, MD [address] [place],  [place] [address] PCP: Shayne Anes, MD  Chief Complaint  Patient presents with   Right Foot - Wound Check   Left Foot - Wound Check      HPI: Discussed the use of AI scribe software for clinical note transcription with the patient, who gave verbal consent to proceed.  History of Present Illness  Patient is a 78 year old gentleman who is seen for initial evaluation for bilateral heel ulcers.  Referred by [facility].  Patient has just completed a course of doxycycline.  Patient states that he has type 2 diabetes diet-controlled with a recent hemoglobin A1c of 5.2..  This was 6.22 years ago.  Patient states the left heel ulcer is painful.  Past medical history significant for A-fib currently on Eliquis  history of stage III kidney disease.  Patient is status post bilateral total knee arthroplasties with revisions x 3 of the right knee and primary total knee arthroplasty on the left.    Assessment & Plan: Visit Diagnoses:  1. Skin ulcer of heel, limited to breakdown of skin, unspecified laterality (HCC)   2. History of total knee arthroplasty, bilateral   3. S/P revision of total knee, right     Plan: Assessment and Plan Assessment & Plan  Assessment: Bilateral heel pressure ulcers.  Plan: Will start Bactroban dressing changes twice a day to the left heel ulcer.  Will give him a PRAFO on the left to be worn while seated and while sleeping.  Reevaluate in 2 weeks.  Patient states he has been having complications with his total knee replacements and will follow-up for further evaluation.     Follow-Up Instructions: Return in about 2 weeks (around 02/24/2024).   Ortho Exam  Patient is alert, oriented, no adenopathy, well-dressed, normal affect, normal  respiratory effort. Physical Exam  Examination patient has brawny edema of both legs consistent with chronic venous insufficiency.  He has good range of motion of both knees.  Examination of his foot and ankle he has a palpable dorsalis pedis and posterior tibial pulse bilaterally.  He has a superficial blood blister to the lateral aspect of the right heel that is 2 cm in diameter.  Examination of the left heel he has a ulcer that is 3 cm in diameter and tender to palpation there is no surrounding cellulitis no purulent drainage.  There is nonviable tissue within the base of the wound.    Imaging: No results found. No images are attached to the encounter.  Labs: Lab Results  Component Value Date   HGBA1C 6.2 (H) 01/30/2022     Lab Results  Component Value Date   ALBUMIN 4.0 01/30/2022   ALBUMIN 3.4 (L) 07/28/2019    Lab Results  Component Value Date   MG 2.3 07/28/2019   MG 2.5 (H) 07/27/2019   No results found for: VD25OH  No results found for: PREALBUMIN    Latest Ref Rng & Units 01/30/2022    9:40 AM 01/25/2021    7:00 AM 07/29/2019   11:30 AM  CBC EXTENDED  WBC 4.0 - 10.5 K/uL 6.9  7.4    RBC 4.22 - 5.81 MIL/uL 3.39  3.40    Hemoglobin 13.0 - 17.0 g/dL 88.3  88.6  85.6   HCT 39.0 - 52.0 % 35.7  35.5  42.0   Platelets 150 - 400 K/uL 179  154    NEUT# 1.7 - 7.7 K/uL  5.4    Lymph# 0.7 - 4.0 K/uL  1.2       There is no height or weight on file to calculate BMI.  Orders:  No orders of the defined types were placed in this encounter.  Meds ordered this encounter  Medications   mupirocin ointment (BACTROBAN) 2 %    Sig: Apply 1 Application topically 2 (two) times daily. Apply to the affected area 2 times a day    Dispense:  22 g    Refill:  3     Procedures: No procedures performed  Clinical Data: No additional findings.  ROS:  All other systems negative, except as noted in the HPI. Review of Systems  Objective: Vital Signs: There were no  vitals taken for this visit.  Specialty Comments:  No specialty comments available.  PMFS History: Patient Active Problem List   Diagnosis Date Noted   Hyperkalemia 11/14/2019   Atrial flutter (HCC) 07/27/2019   Diabetes mellitus type 2, diet-controlled (HCC) 07/27/2019   CKD stage 3 due to type 2 diabetes mellitus (HCC) 07/27/2019   Chronic kidney disease (CKD), stage IV (severe) (HCC)    OSA on CPAP    Macrocytosis without anemia    Coronary artery disease involving native heart without angina pectoris 06/09/2018   Essential hypertension 06/09/2018   Dyslipidemia 06/09/2018   Educated about COVID-19 virus infection 06/09/2018   History of total knee replacement, bilateral 01/28/2018   Low back pain 01/28/2018   History of revision of total replacement of right hip joint 12/22/2017   Pain of right hip joint 12/22/2017   Obesity, Class II, BMI 35-39.9 01/30/2017   Controlled type 2 diabetes mellitus without complication, without long-term current use of insulin  (HCC) 01/28/2017   AKI (acute kidney injury) 01/27/2017   GI bleed 01/26/2017   Mixed hyperlipidemia 05/20/2016   Past Medical History:  Diagnosis Date   Anemia    Arthritis    Atrial flutter with rapid ventricular response (HCC) 07/27/2019   Basal cell carcinoma    CAD (coronary artery disease) 2005    PCI to mid LAD with a 3.0 x 15 mm Resolute Integrity DES May 2017. LAD Stent 2005 (Details not available)   Cataract    CKD stage 3 due to type 2 diabetes mellitus (HCC) 07/27/2019   Curvature of spine    Diabetes mellitus type 2, diet-controlled (HCC) 07/27/2019   Gallstones    Hypertension    Kidney stones    Macular degeneration    Obesity    OSA (obstructive sleep apnea)    CPAP    Family History  Problem Relation Age of Onset   Bladder Cancer Mother    Rheum arthritis Mother    Colon cancer Father 21   Diabetes Paternal Grandmother    Tuberculosis Paternal Grandfather    Esophageal cancer Neg Hx     Rectal cancer Neg Hx     Past Surgical History:  Procedure Laterality Date   ABDOMINOPLASTY  02/2009   CARDIOVERSION N/A 07/29/2019   Procedure: CARDIOVERSION;  Surgeon: Kate Lonni CROME, MD;  Location: [facility] ENDOSCOPY;  Service: Cardiovascular;  Laterality: N/A;   CATARACT EXTRACTION, BILATERAL  03/2007   CHOLECYSTECTOMY  2001   COLONOSCOPY  08/07  11/12 04/16   CORONARY ANGIOPLASTY WITH STENT PLACEMENT  07/2003   Stent LAD, 06/2015 DES LAD   HIP SURGERY Left    INGUINAL HERNIA REPAIR Left 1956   KNEE ARTHROSCOPY Right 05/2005   x3   LAMINECTOMY  2009   Lumbar   REPLACEMENT TOTAL KNEE Left 1994   REPLACEMENT TOTAL KNEE Right    x 3   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   TEE WITHOUT CARDIOVERSION N/A 07/29/2019   Procedure: TRANSESOPHAGEAL ECHOCARDIOGRAM (TEE);  Surgeon: Kate Lonni CROME, MD;  Location: [facility] ENDOSCOPY;  Service: Cardiovascular;  Laterality: N/A;   TOTAL HIP ARTHROPLASTY Right 01/2007   Social History   Occupational History   Occupation: Retired Charity Fundraiser  Tobacco Use   Smoking status: Former    Types: Pipe   Smokeless tobacco: Never   Tobacco comments:    Smoked a pipe around [age] years old  Vaping Use   Vaping status: Never Used  Substance and Sexual Activity   Alcohol  use: Yes    Comment: highball and wine daily   Drug use: Never   Sexual activity: Not on file         "

## 2024-02-23 ENCOUNTER — Ambulatory Visit: Admitting: Orthopedic Surgery

## 2024-02-23 DIAGNOSIS — Z96653 Presence of artificial knee joint, bilateral: Secondary | ICD-10-CM | POA: Diagnosis not present

## 2024-02-23 DIAGNOSIS — L97401 Non-pressure chronic ulcer of unspecified heel and midfoot limited to breakdown of skin: Secondary | ICD-10-CM

## 2024-02-23 DIAGNOSIS — Z96651 Presence of right artificial knee joint: Secondary | ICD-10-CM | POA: Diagnosis not present

## 2024-02-24 ENCOUNTER — Encounter: Payer: Self-pay | Admitting: Orthopedic Surgery

## 2024-02-24 NOTE — Progress Notes (Signed)
 "  Office Visit Note   Patient: Carlos Moreno           Date of Birth: 26-Jun-1945           MRN: [id number] Visit Date: 02/23/2024              Requested by: Shayne Anes, MD [address] [place],  [place] [address] PCP: Shayne Anes, MD  Chief Complaint  Patient presents with   Right Foot - Wound Check   Left Foot - Wound Check      HPI: Discussed the use of AI scribe software for clinical note transcription with the patient, who gave verbal consent to proceed.  History of Present Illness Carlos Moreno is a 79 year old male with chronic bilateral heel ulcers and lower extremity edema who presents for follow-up of bilateral heel wounds.  He has chronic bilateral heel ulcers managed with topical antibiotic ointment and twice daily dressing changes, reporting significant improvement. The right heel is asymptomatic, while the left heel remains mildly tender. He has not used zinc  oxide-impregnated patches, expressing skepticism regarding their efficacy. He has not required additional ointments and denies new erythema, drainage, or increased pain.  He denies new or worsening skin breakdown since his last visit.  He experiences chronic brawny pitting edema of both lower extremities. He intermittently wears 15-20 mmHg compression socks made of alpaca and merino wool with silver and copper, primarily during exercise. He notes these socks reduce swelling, discoloration, and improve circulation.     Assessment & Plan: Visit Diagnoses: No diagnosis found.  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 Chronic dry skin consistent with tinea pedis. Persistent brawny pitting edema bilaterally. - Debrided dry eschar from left heel with ten blade scalpel. - Discontinued topical antibiotics and ointments. - Advised daily use of high compression socks to reduce  edema and improve circulation. - Recommended natural fiber socks to wick moisture and reduce tinea pedis risk; advised against cotton or polyester socks. - Explained zinc  oxide patches not indicated due to absence of infection. - Advised to leave wounds open and avoid Betadine or other topical agents. - Provided information on local sources for recommended socks. - Instructed to follow up as needed.      Follow-Up Instructions: No follow-ups on file.   Ortho Exam  Patient is alert, oriented, no adenopathy, well-dressed, normal affect, normal respiratory effort. Physical Exam EXTREMITIES: Dry cracked skin on both feet consistent with chronic fungal load. Right lateral heel ulcer healed with dry eschar and good epithelialization. Left lateral heel ulcer completely epithelialized. Wound 2x3 cm with healthy epithelialization after dry skin removal. Brawny pitting edema in both legs.      Imaging: No results found. No images are attached to the encounter.  Labs: Lab Results  Component Value Date   HGBA1C 6.2 (H) 01/30/2022     Lab Results  Component Value Date   ALBUMIN 4.0 01/30/2022   ALBUMIN 3.4 (L) 07/28/2019    Lab Results  Component Value Date   MG 2.3 07/28/2019   MG 2.5 (H) 07/27/2019   No results found for: VD25OH  No results found for: PREALBUMIN    Latest Ref Rng & Units 01/30/2022    9:40 AM 01/25/2021    7:00 AM 07/29/2019   11:30 AM  CBC EXTENDED  WBC 4.0 - 10.5 K/uL 6.9  7.4    RBC 4.22 -  5.81 MIL/uL 3.39  3.40    Hemoglobin 13.0 - 17.0 g/dL 88.3  88.6  85.6   HCT 39.0 - 52.0 % 35.7  35.5  42.0   Platelets 150 - 400 K/uL 179  154    NEUT# 1.7 - 7.7 K/uL  5.4    Lymph# 0.7 - 4.0 K/uL  1.2       There is no height or weight on file to calculate BMI.  Orders:  No orders of the defined types were placed in this encounter.  No orders of the defined types were placed in this encounter.    Procedures: No procedures performed  Clinical  Data: No additional findings.  ROS:  All other systems negative, except as noted in the HPI. Review of Systems  Objective: Vital Signs: There were no vitals taken for this visit.  Specialty Comments:  No specialty comments available.  PMFS History: Patient Active Problem List   Diagnosis Date Noted   Hyperkalemia 11/14/2019   Atrial flutter (HCC) 07/27/2019   Diabetes mellitus type 2, diet-controlled (HCC) 07/27/2019   CKD stage 3 due to type 2 diabetes mellitus (HCC) 07/27/2019   Chronic kidney disease (CKD), stage IV (severe) (HCC)    OSA on CPAP    Macrocytosis without anemia    Coronary artery disease involving native heart without angina pectoris 06/09/2018   Essential hypertension 06/09/2018   Dyslipidemia 06/09/2018   Educated about COVID-19 virus infection 06/09/2018   History of total knee replacement, bilateral 01/28/2018   Low back pain 01/28/2018   History of revision of total replacement of right hip joint 12/22/2017   Pain of right hip joint 12/22/2017   Obesity, Class II, BMI 35-39.9 01/30/2017   Controlled type 2 diabetes mellitus without complication, without long-term current use of insulin  (HCC) 01/28/2017   AKI (acute kidney injury) 01/27/2017   GI bleed 01/26/2017   Mixed hyperlipidemia 05/20/2016   Past Medical History:  Diagnosis Date   Anemia    Arthritis    Atrial flutter with rapid ventricular response (HCC) 07/27/2019   Basal cell carcinoma    CAD (coronary artery disease) 2005    PCI to mid LAD with a 3.0 x 15 mm Resolute Integrity DES May 2017. LAD Stent 2005 (Details not available)   Cataract    CKD stage 3 due to type 2 diabetes mellitus (HCC) 07/27/2019   Curvature of spine    Diabetes mellitus type 2, diet-controlled (HCC) 07/27/2019   Gallstones    Hypertension    Kidney stones    Macular degeneration    Obesity    OSA (obstructive sleep apnea)    CPAP    Family History  Problem Relation Age of Onset   Bladder Cancer Mother     Rheum arthritis Mother    Colon cancer Father 40   Diabetes Paternal Grandmother    Tuberculosis Paternal Grandfather    Esophageal cancer Neg Hx    Rectal cancer Neg Hx     Past Surgical History:  Procedure Laterality Date   ABDOMINOPLASTY  02/2009   CARDIOVERSION N/A 07/29/2019   Procedure: CARDIOVERSION;  Surgeon: Kate Lonni CROME, MD;  Location: [facility] ENDOSCOPY;  Service: Cardiovascular;  Laterality: N/A;   CATARACT EXTRACTION, BILATERAL  03/2007   CHOLECYSTECTOMY  2001   COLONOSCOPY  08/07 11/12 04/16    CORONARY ANGIOPLASTY WITH STENT PLACEMENT  07/2003   Stent LAD, 06/2015 DES LAD   HIP SURGERY Left    INGUINAL HERNIA REPAIR Left 1956  KNEE ARTHROSCOPY Right 05/2005   x3   LAMINECTOMY  2009   Lumbar   REPLACEMENT TOTAL KNEE Left 1994   REPLACEMENT TOTAL KNEE Right    x 3   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   TEE WITHOUT CARDIOVERSION N/A 07/29/2019   Procedure: TRANSESOPHAGEAL ECHOCARDIOGRAM (TEE);  Surgeon: Kate Lonni CROME, MD;  Location: [facility] ENDOSCOPY;  Service: Cardiovascular;  Laterality: N/A;   TOTAL HIP ARTHROPLASTY Right 01/2007   Social History   Occupational History   Occupation: Retired Char[employer] Fundraiser  Tobacco Use   Smoking status: Former    Types: Pipe   Smokeless tobacco: Never   Tobacco comments:    Smoked a pipe around [age] years old  Vaping Use   Vaping status: Never Used  Substance and Sexual Activity   Alcohol  use: Yes    Comment: highball and wine daily   Drug use: Never   Sexual activity: Not on file         "
